# Patient Record
Sex: Male | Born: 1940 | Race: White | Hispanic: No | Smoking: Never smoker
Health system: Southern US, Community
[De-identification: ages and names within clinical notes are randomized; demographics above are authoritative.]

## PROBLEM LIST (undated history)

## (undated) DIAGNOSIS — G4762 Sleep related leg cramps: Secondary | ICD-10-CM

## (undated) DIAGNOSIS — J189 Pneumonia, unspecified organism: Secondary | ICD-10-CM

## (undated) DIAGNOSIS — Z86010 Personal history of colonic polyps: Secondary | ICD-10-CM

## (undated) DIAGNOSIS — G4733 Obstructive sleep apnea (adult) (pediatric): Secondary | ICD-10-CM

## (undated) DIAGNOSIS — R05 Cough: Secondary | ICD-10-CM

## (undated) DIAGNOSIS — R351 Nocturia: Secondary | ICD-10-CM

## (undated) DIAGNOSIS — C61 Malignant neoplasm of prostate: Secondary | ICD-10-CM

## (undated) DIAGNOSIS — K219 Gastro-esophageal reflux disease without esophagitis: Secondary | ICD-10-CM

## (undated) DIAGNOSIS — I10 Essential (primary) hypertension: Secondary | ICD-10-CM

## (undated) DIAGNOSIS — R053 Chronic cough: Secondary | ICD-10-CM

## (undated) DIAGNOSIS — R413 Other amnesia: Secondary | ICD-10-CM

## (undated) DIAGNOSIS — J342 Deviated nasal septum: Secondary | ICD-10-CM

## (undated) DIAGNOSIS — E785 Hyperlipidemia, unspecified: Secondary | ICD-10-CM

## (undated) DIAGNOSIS — R232 Flushing: Secondary | ICD-10-CM

## (undated) DIAGNOSIS — Z9289 Personal history of other medical treatment: Secondary | ICD-10-CM

## (undated) DIAGNOSIS — M1990 Unspecified osteoarthritis, unspecified site: Secondary | ICD-10-CM

## (undated) DIAGNOSIS — N401 Enlarged prostate with lower urinary tract symptoms: Secondary | ICD-10-CM

## (undated) DIAGNOSIS — Z860101 Personal history of adenomatous and serrated colon polyps: Secondary | ICD-10-CM

## (undated) DIAGNOSIS — Z8042 Family history of malignant neoplasm of prostate: Secondary | ICD-10-CM

## (undated) HISTORY — DX: Sleep related leg cramps: G47.62

## (undated) HISTORY — DX: Chronic cough: R05.3

## (undated) HISTORY — PX: KNEE ARTHROSCOPY: SUR90

## (undated) HISTORY — PX: PROSTATE BIOPSY: SHX241

## (undated) HISTORY — DX: Pneumonia, unspecified organism: J18.9

## (undated) HISTORY — DX: Cough: R05

## (undated) HISTORY — PX: TOTAL KNEE ARTHROPLASTY: SHX125

## (undated) HISTORY — DX: Family history of malignant neoplasm of prostate: Z80.42

## (undated) HISTORY — PX: COLONOSCOPY: SHX174

## (undated) HISTORY — DX: Essential (primary) hypertension: I10

---

## 1968-10-09 HISTORY — PX: TOE SURGERY: SHX1073

## 1990-10-09 HISTORY — PX: HAND SURGERY: SHX662

## 1998-11-25 ENCOUNTER — Emergency Department (HOSPITAL_COMMUNITY): Admission: EM | Admit: 1998-11-25 | Discharge: 1998-11-25 | Payer: Self-pay | Admitting: Emergency Medicine

## 2000-09-07 ENCOUNTER — Other Ambulatory Visit: Admission: RE | Admit: 2000-09-07 | Discharge: 2000-09-07 | Payer: Self-pay | Admitting: Gastroenterology

## 2002-10-09 HISTORY — PX: ROTATOR CUFF REPAIR: SHX139

## 2006-11-28 ENCOUNTER — Ambulatory Visit: Payer: Self-pay | Admitting: Gastroenterology

## 2006-11-30 ENCOUNTER — Ambulatory Visit: Payer: Self-pay | Admitting: Gastroenterology

## 2006-11-30 ENCOUNTER — Encounter (INDEPENDENT_AMBULATORY_CARE_PROVIDER_SITE_OTHER): Payer: Self-pay | Admitting: Specialist

## 2009-01-13 DIAGNOSIS — I10 Essential (primary) hypertension: Secondary | ICD-10-CM | POA: Insufficient documentation

## 2009-01-14 ENCOUNTER — Ambulatory Visit: Payer: Self-pay | Admitting: Cardiology

## 2009-01-14 ENCOUNTER — Encounter: Payer: Self-pay | Admitting: Cardiology

## 2009-01-14 ENCOUNTER — Ambulatory Visit: Payer: Self-pay

## 2009-01-25 ENCOUNTER — Inpatient Hospital Stay (HOSPITAL_COMMUNITY): Admission: RE | Admit: 2009-01-25 | Discharge: 2009-01-29 | Payer: Self-pay | Admitting: Orthopedic Surgery

## 2009-10-15 ENCOUNTER — Encounter (INDEPENDENT_AMBULATORY_CARE_PROVIDER_SITE_OTHER): Payer: Self-pay | Admitting: *Deleted

## 2010-01-25 ENCOUNTER — Encounter (INDEPENDENT_AMBULATORY_CARE_PROVIDER_SITE_OTHER): Payer: Self-pay | Admitting: *Deleted

## 2010-01-25 ENCOUNTER — Ambulatory Visit: Payer: Self-pay | Admitting: Gastroenterology

## 2010-01-25 DIAGNOSIS — Z96659 Presence of unspecified artificial knee joint: Secondary | ICD-10-CM | POA: Insufficient documentation

## 2010-01-25 DIAGNOSIS — Z860100 Personal history of colon polyps, unspecified: Secondary | ICD-10-CM | POA: Insufficient documentation

## 2010-03-30 ENCOUNTER — Ambulatory Visit: Payer: Self-pay | Admitting: Gastroenterology

## 2010-04-01 ENCOUNTER — Encounter: Payer: Self-pay | Admitting: Gastroenterology

## 2010-11-08 NOTE — Procedures (Signed)
Summary: Colonoscopy  Patient: Ricky [name] Note: All result statuses are Final unless otherwise noted.  Tests: (1) Colonoscopy (COL)   COL Colonoscopy           DONE     [facility]     [address]     [place], [place]  [address]           COLONOSCOPY PROCEDURE REPORT           PATIENT:  Ricky [name], Ricky [name]  MR#:  [id number]     BIRTHDATE:  1941/02/11, 68 yrs. old  GENDER:  male     ENDOSCOPIST:  Vania Rea. Jarold Motto, MD, [facility]     REF. BY:     PROCEDURE DATE:  03/30/2010     PROCEDURE:  Colonoscopy with snare polypectomy     ASA CLASS:  Class II     INDICATIONS:  history of pre-cancerous (adenomatous) colon polyps           MEDICATIONS:   Fentanyl 50 mcg IV, Versed 6 mg IV           DESCRIPTION OF PROCEDURE:   After the risks benefits and     alternatives of the procedure were thoroughly explained, informed     consent was obtained.  Digital rectal exam was performed and     revealed no abnormalities.   The LB [id number] endoscope     was introduced through the anus and advanced to the cecum, which     was identified by both the appendix and ileocecal valve, without     limitations.  The quality of the prep was good, using MoviPrep.     The instrument was then slowly withdrawn as the colon was fully     examined.     <<PROCEDUREIMAGES>>           FINDINGS:  There were multiple polyps identified and removed.     found scattered throught the colon. fLAT 5-15 MM POLYPS HOT AND     COLD SNARE EXCISED FROM #1 CECUM, ANDv #2 FROM TV COLON.  Mild     diverticulosis was found in the sigmoid to descending colon     segments.   Retroflexed views in the rectum revealed no     abnormalities.    The scope was then withdrawn from the patient     and the procedure completed.           COMPLICATIONS:  None     ENDOSCOPIC IMPRESSION:     1) Polyps, multiple found scattered throught the colon     2) Mild diverticulosis in the sigmoid to descending colon     segments  RECOMMENDATIONS:     3Y FOLLOWUP.     REPEAT EXAM:  No           ______________________________     Vania Rea. Jarold Motto, MD, [facility]           CC:  Rudi Heap, MD           n.     Rosalie Doctor:   Vania Rea. Narda Fundora at 03/30/2010 11:57 AM           Geoffery Spruce, [id number]  Note: An exclamation mark (!) indicates a result that was not dispersed into the flowsheet. Document Creation Date: 03/30/2010 11:57 AM _______________________________________________________________________  (1) Order result status: Final Collection or observation date-time: 03/30/2010 11:49 Requested date-time:  Receipt date-time:  Reported date-time:  Referring Physician:   Ordering Physician: Onalee Hua  Jarold Motto [phone]) Specimen Source:  Source: Launa Grill Order Number: [id number] Lab site:   Appended Document: Colonoscopy     Procedures Next Due Date:    Colonoscopy: 03/2013

## 2010-11-08 NOTE — Letter (Signed)
Summary: Colonoscopy-Changed to Office Visit Letter  [facility]  [address] [place], [place] [address]   Phone: [phone]  Fax: [phone]      October 15, 2009 MRN: [id number]   AVEL OGAWA [address] [place], [place]  [address]   Dear Mr. NALEPA,   According to our records, it is time for you to schedule a Colonoscopy. However, after reviewing your medical record, I feel that an office visit would be most appropriate to more completely evaluate you and determine your need for a repeat procedure.  Please call [phone] (option #2) at your convenience to schedule an office visit. If you have any questions, concerns, or feel that this letter is in error, we would appreciate your call.   Sincerely,  Vania Rea. Jarold Motto, M.D.  [facility] [phone]

## 2010-11-08 NOTE — Letter (Signed)
Summary: [facility] Instructions  [facility]  [address] [place], [place] [address]   Phone: [phone]  Fax: [phone]       Ricky [name]    [birth year]    MRN: [id number]        Procedure Day Dorna Bloom: Wednesday, 03/30/10     Arrival Time: 10:00      Procedure Time: 11:00     Location of Procedure:                    _X _  [facility] (4th Floor)                        PREPARATION FOR COLONOSCOPY WITH MOVIPREP   Starting 5 days prior to your procedure 03/25/10 do not eat nuts, seeds, popcorn, corn, beans, peas,  salads, or any raw vegetables.  Do not take any fiber supplements (e.g. Metamucil, Citrucel, and Benefiber).  THE DAY BEFORE YOUR PROCEDURE         DATE: 03/29/10   DAY: Tuesday  1.  Drink clear liquids the entire day-NO SOLID FOOD  2.  Do not drink anything colored red or purple.  Avoid juices with pulp.  No orange juice.  3.  Drink at least 64 oz. (8 glasses) of fluid/clear liquids during the day to prevent dehydration and help the prep work efficiently.  CLEAR LIQUIDS INCLUDE: Water Jello Ice Popsicles Tea (sugar ok, no milk/cream) Powdered fruit flavored drinks Coffee (sugar ok, no milk/cream) Gatorade Juice: apple, white grape, white cranberry  Lemonade Clear bullion, consomm, broth Carbonated beverages (any kind) Strained chicken noodle soup Hard Candy                             4.  In the morning, mix first dose of MoviPrep solution:    Empty 1 Pouch A and 1 Pouch B into the disposable container    Add lukewarm drinking water to the top line of the container. Mix to dissolve    Refrigerate (mixed solution should be used within 24 hrs)  5.  Begin drinking the prep at 5:00 p.m. The MoviPrep container is divided by 4 marks.   Every 15 minutes drink the solution down to the next mark (approximately 8 oz) until the full liter is complete.   6.  Follow completed prep with 16 oz of clear liquid of your choice (Nothing  red or purple).  Continue to drink clear liquids until bedtime.  7.  Before going to bed, mix second dose of MoviPrep solution:    Empty 1 Pouch A and 1 Pouch B into the disposable container    Add lukewarm drinking water to the top line of the container. Mix to dissolve    Refrigerate  THE DAY OF YOUR PROCEDURE      DATE: 03/30/10   DAY:  Wednesday  Beginning at  6:00 a.m. (5 hours before procedure):         1. Every 15 minutes, drink the solution down to the next mark (approx 8 oz) until the full liter is complete.  2. Follow completed prep with 16 oz. of clear liquid of your choice.    3. You may drink clear liquids until 9:00  (2 HOURS BEFORE PROCEDURE).   MEDICATION INSTRUCTIONS  Unless otherwise instructed, you should take regular prescription medications with a small sip of water   as early as  possible the morning of your procedure.                 OTHER INSTRUCTIONS  You will need a responsible adult at least 70 years of age to accompany you and drive you home.   This person must remain in the waiting room during your procedure.  Wear loose fitting clothing that is easily removed.  Leave jewelry and other valuables at home.  However, you may wish to bring a book to read or  an iPod/MP3 player to listen to music as you wait for your procedure to start.  Remove all body piercing jewelry and leave at home.  Total time from sign-in until discharge is approximately 2-3 hours.  You should go home directly after your procedure and rest.  You can resume normal activities the  day after your procedure.  The day of your procedure you should not:   Drive   Make legal decisions   Operate machinery   Drink alcohol   Return to work  You will receive specific instructions about eating, activities and medications before you leave.    The above instructions have been reviewed and explained to me by   _______________________    I fully understand and can  verbalize these instructions _____________________________ Date _________

## 2010-11-08 NOTE — Letter (Signed)
Summary: Patient Notice- Polyp Results  [facility]  [address] [place], [place] [address]   Phone: [phone]  Fax: [phone]        April 01, 2010 MRN: [id number]    Ricky [name] [address] [place], [place]  [address]    Dear Ricky [name],  I am pleased to inform you that the colon polyp(s) removed during your recent colonoscopy was (were) found to be benign (no cancer detected) upon pathologic examination.  I recommend you have a repeat colonoscopy examination in 3_ years to look for recurrent polyps, as having colon polyps increases your risk for having recurrent polyps or even colon cancer in the future.  Should you develop new or worsening symptoms of abdominal pain, bowel habit changes or bleeding from the rectum or bowels, please schedule an evaluation with either your primary care physician or with me.  Additional information/recommendations:  __ No further action with gastroenterology is needed at this time. Please      follow-up with your primary care physician for your other healthcare      needs.  __ Please call [phone] to schedule a return visit to review your      situation.  __ Please keep your follow-up visit as already scheduled.  _X_ Continue treatment plan as outlined the day of your exam.  Please call us if you are having persistent problems or have questions about your condition that have not been fully answered at this time.  Sincerely,  Mardella Layman MD [facility]  This letter has been electronically signed by your physician.  Appended Document: Patient Notice- Polyp Results letter mailed.

## 2010-11-08 NOTE — Assessment & Plan Note (Signed)
Summary: colon recall cx needs ov...em   History of Present Illness Visit Type: Follow-up Visit Primary GI MD: Sheryn Bison MD FACP FAGA Primary Provider: Rudi Heap, MD Chief Complaint: Colon recall, no problems History of Present Illness:   70 year old Caucasian male here for followup colonoscopy from colonoscopy 3 years ago which showed multiple adenomatous polyps. He is currently asymptomatic and denies upper gastrointestinal or lower gastrointestinal or hepatobiliary complaints. He does have mild essential hypertension has had bilateral knee replacements with surgery by Dr. Despina Hick. He denies chronic NSAID use. Family history is noncontributory. His appetite is good and his weight is stable. Before his recent orthopedic surgery had cardiac stress testing that was normal. He is followed by Dr. Rudi Heap in [facility]   GI Review of Systems      Denies abdominal pain, acid reflux, belching, bloating, chest pain, dysphagia with liquids, dysphagia with solids, heartburn, loss of appetite, nausea, vomiting, vomiting blood, weight loss, and  weight gain.        Denies anal fissure, black tarry stools, change in bowel habit, constipation, diarrhea, diverticulosis, fecal incontinence, heme positive stool, hemorrhoids, irritable bowel syndrome, jaundice, light color stool, liver problems, rectal bleeding, and  rectal pain.    Current Medications (verified): 1)  Tamsulosin Hcl 0.4 Mg Caps (Tamsulosin Hcl) .... Two Times A Day 2)  Klor-Con M20 20 Meq Cr-Tabs (Potassium Chloride Crys Cr) .... Two Times A Day 3)  Valturna 150-160 Mg Tabs (Aliskiren-Valsartan) .... Once Daily 4)  Amlodipine Besylate 10 Mg Tabs (Amlodipine Besylate) .... Once Daily 5)  Terbinafine Hcl 250 Mg Tabs (Terbinafine Hcl) .... Once Daily 6)  Oxybutynin Chloride 5 Mg Xr24h-Tab (Oxybutynin Chloride) .... Once Daily  Allergies (verified): No Known Drug Allergies  Past History:  Past medical, surgical,  family and social histories (including risk factors) reviewed for relevance to current acute and chronic problems.  Past Medical History: Reviewed history from 01/13/2009 and no changes required. HYPERTENSION, UNSPECIFIED (ICD-401.9)  Past Surgical History: Reviewed history from 01/13/2009 and no changes required. Vasectomy Microwave therapy for BPH  Family History: Reviewed history from 01/13/2009 and no changes required. Noncontributory No FH of Colon Cancer: Family History of Prostate Cancer:Father  Social History: Reviewed history from 01/13/2009 and no changes required. Retired  Single  Tobacco Use - No.  Alcohol Use - no  Review of Systems       The patient complains of allergy/sinus, arthritis/joint pain, swelling of feet/legs, and urine leakage.  The patient denies anemia, anxiety-new, back pain, blood in urine, breast changes/lumps, change in vision, confusion, cough, coughing up blood, depression-new, fainting, fatigue, fever, headaches-new, hearing problems, heart murmur, heart rhythm changes, itching, menstrual pain, muscle pains/cramps, night sweats, nosebleeds, pregnancy symptoms, shortness of breath, skin rash, sleeping problems, sore throat, swollen lymph glands, thirst - excessive , urination - excessive , urination changes/pain, vision changes, and voice change.    Vital Signs:  Patient profile:   70 year old male Height:      67 inches Weight:      201.50 pounds BMI:     31.67 Pulse rate:   68 / minute Pulse rhythm:   regular BP sitting:   142 / 86  (left arm) Cuff size:   regular  Vitals Entered By: June McMurray CMA Duncan Dull) (January 25, 2010 10:36 AM)  Physical Exam  General:  Well developed, well nourished, no acute distress.healthy appearing.   Head:  Normocephalic and atraumatic. Eyes:  PERRLA, no icterus.exam deferred to patient's ophthalmologist.  Lungs:  Clear throughout to auscultation. Heart:  Regular rate and rhythm; no murmurs, rubs,  or  bruits. Abdomen:  Soft, nontender and nondistended. No masses, hepatosplenomegaly or hernias noted. Normal bowel sounds. Extremities:  No clubbing, cyanosis, edema or deformities noted. Neurologic:  Alert and  oriented x4;  grossly normal neurologically. Psych:  Alert and cooperative. Normal mood and affect.   Impression & Recommendations:  Problem # 1:  HYPERTENSION, UNSPECIFIED (ICD-401.9) Assessment Improved blood pressure today is 142/86 and I have advised him to continue all of his meds per Dr. Christell Constant  Problem # 2:  PERSONAL HX COLONIC POLYPS (ICD-V12.72) Assessment: Unchanged colonoscopy with standard prep scheduled at [place] convenience. [place]. I did not repeat any lab data at this time.  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 2)  Please take the prescription to your pharmacy for your prep. 3)  The medication list was reviewed and reconciled.  All changed / newly prescribed medications were explained.  A complete medication list was provided to the patient / caregiver. 4)  Copy sent to : Dr. Rudi Heap and Dr. Trudee Grip in orthopedics. 5)  Please continue current medications.  6)  Colonoscopy and Flexible Sigmoidoscopy brochure given.  7)  Conscious Sedation brochure given.  Prescriptions: MOVIPREP 100 GM  SOLR (PEG-KCL-NACL-NASULF-NA ASC-C) As per prep instructions.  #1 x 0   Entered by:   Ashok Cordia RN   Authorized by:   Mardella Layman MD [facility]   Signed by:   Ashok Cordia RN on 01/25/2010   Method used:   Print then Give to Patient   RxID:   [id number]   Appended Document: colon recall cx needs ov...em    Clinical Lists Changes  Orders: Added new Test order of Colonoscopy (Colon) - Signed

## 2010-11-08 NOTE — Procedures (Signed)
Summary: Colon and Path   Colonoscopy  Procedure date:  11/30/2006  Findings:      Location:  [facility].    Colonoscopy  Procedure date:  11/30/2006  Findings:      Location:  [facility].   Patient Name: Ricky [name], Ricky [name]. MRN:  Procedure Procedures: Colonoscopy CPT: [phone].  Personnel: Endoscopist: Vania Rea. Jarold Motto, MD.  Exam Location: Exam performed in Outpatient Clinic. Outpatient  Patient Consent: Procedure, Alternatives, Risks and Benefits discussed, consent obtained, from patient. Consent was obtained by the RN.  Indications Symptoms: Change in bowel habits. Rectal incontinency...  History  Current Medications: Patient is not currently taking Coumadin.  Medical/ Surgical History: Hypertension, Benign Prostatic Hypertrophy,  Pre-Exam Physical: Performed Nov 30, 2006. Cardio-pulmonary exam, Rectal exam, Abdominal exam, Extremity exam, Mental status exam WNL.  Comments: Pt. history reviewed/updated, physical exam performed prior to initiation of sedation?yes Exam Exam: Extent of exam reached: Cecum, extent intended: Cecum.  The cecum was identified by appendiceal orifice and IC valve. Patient position: on left side. Colon retroflexion performed. Images taken. ASA Classification: II. Tolerance: excellent.  Monitoring: Pulse and BP monitoring, Oximetry used. Supplemental O2 given. at 2 Liters.  Colon Prep Used Golytely for colon prep. Prep results: good.  Sedation Meds: Patient assessed and found to be appropriate for moderate (conscious) sedation. Fentanyl 50 mcg. given IV. Versed 8 mg. given IV.  Instrument(s): CF 140L. Serial [id number].  Findings - MULTIPLE POLYPS: Transverse Colon. minimum size 2 mm, maximum size 5 mm. Procedure:  snare with cautery, removed, Polyp retrieved, Polyps sent to pathology. Comments: Specium #1.  - NORMAL EXAM: Cecum to Rectum. Not Seen: Polyps. AVM's. Colitis. Tumors. Melanosis. Crohn's.  Diverticulosis. Hemorrhoids.  - POLYP: Sigmoid Colon, Maximum size: 15 mm. pedunculated polyp. Procedure:  snare with cautery, removed, retrieved, Polyp sent to pathology. ICD9: Colon Polyps: 211.3.   Assessment  Diagnoses: 211.3: Colon Polyps.   Events  Unplanned Interventions: No intervention was required.  Plans  Post Exam Instructions: No aspirin or non-steroidal containing medications: 2 weeks.  Medication Plan: Await pathology. Referring provider to order medications.  Patient Education: Patient given standard instructions for: Polyps. Patient instructed to get routine colonoscopy every 3 years.  Comments: His BP is significently elevated and I started dioban 80mg /day with F/U Dr. Christell Constant.... Disposition: After procedure patient sent to recovery. After recovery patient sent home.  Scheduling/Referral: Follow-Up prn. Await pathology to schedule patient.    cc: Rudi Heap, MD      [facility] Surgical Pathology - STATUS: Final             By: Windy Kalata,      Perform Date: 22Feb08 00:01  Ordered By: Talbert Forest Date: 25Feb08 08:44  Facility: [facility]                               Department: CPATH  Service Report Text  [facility]   [address] [address]   [place], [place] [address]   Telephone [phone] or [phone] Fax [phone]    REPORT OF SURGICAL PATHOLOGY    Case #: [id number]   Patient Name: [name], [name]   Office Chart Number: [id number]    MRN: [id number]   Pathologist: Alden Server A. Delila Spence, MD   DOB/Age [birth year] (Age: 70) Gender: M   Date Taken: 11/30/2006   Date Received:  11/30/2006    FINAL DIAGNOSIS    ***MICROSCOPIC EXAMINATION AND DIAGNOSIS***    1. TRANSVERSE COLON, POLYP(S): ADENOMATOUS POLYP(S). NO HIGH   GRADE DYSPLASIA OR INVASIVE MALIGNANCY IDENTIFIED.    2. SIGMOID COLON, POLYP(S): ADENOMATOUS POLYP(S). NO HIGH   GRADE DYSPLASIA OR INVASIVE MALIGNANCY IDENTIFIED.    mj    Date Reported: 12/03/2006 Alden Server A. Delila Spence, MD   *** Electronically Signed Out By [facility] ***    Clinical information   1,2. R/O adenoma (jes)    specimen(s) obtained   1: Colon, polyp(s), transverse   2: Colon, polyp(s), sigmoid    Gross Description   1. Received in formalin is a tan, soft tissue fragment that is   submitted in toto. Size: 0.4 cm    2. Received in formalin is a 1.2 cm tan-red mucosal polyp and   two separate tan mucosal fragments measuring 0.3 and 0.5 cm in   greatest dimension. The polyp is inked and sectioned. One   smaller piece is inked and bisected. The specimen is entirely   submitted in two cassettes.   A one small fragment and one bisected fragment   B one sectioned polyp (GP:jes, 12/03/06)    jes/    This report was created from the original endoscopy report, which was reviewed and signed by the above listed endoscopist.

## 2011-01-18 LAB — DIFFERENTIAL
Basophils Absolute: 0 10*3/uL (ref 0.0–0.1)
Eosinophils Relative: 1 % (ref 0–5)
Lymphocytes Relative: 11 % — ABNORMAL LOW (ref 12–46)
Monocytes Absolute: 0.8 10*3/uL (ref 0.1–1.0)

## 2011-01-18 LAB — BASIC METABOLIC PANEL
BUN: 17 mg/dL (ref 6–23)
BUN: 20 mg/dL (ref 6–23)
BUN: 22 mg/dL (ref 6–23)
CO2: 30 mEq/L (ref 19–32)
CO2: 31 mEq/L (ref 19–32)
Calcium: 7.8 mg/dL — ABNORMAL LOW (ref 8.4–10.5)
Calcium: 8.1 mg/dL — ABNORMAL LOW (ref 8.4–10.5)
Calcium: 8.1 mg/dL — ABNORMAL LOW (ref 8.4–10.5)
Chloride: 102 mEq/L (ref 96–112)
Chloride: 105 mEq/L (ref 96–112)
Creatinine, Ser: 1.21 mg/dL (ref 0.4–1.5)
Creatinine, Ser: 1.28 mg/dL (ref 0.4–1.5)
GFR calc Af Amer: >60 mL/min (ref >60)
GFR calc Af Amer: >60 mL/min (ref >60)
GFR calc non Af Amer: 44 mL/min — ABNORMAL LOW (ref >60)
GFR calc non Af Amer: 56 mL/min — ABNORMAL LOW (ref >60)
GFR calc non Af Amer: >60 mL/min (ref >60)
Glucose, Bld: 113 mg/dL — ABNORMAL HIGH (ref 70–99)
Glucose, Bld: 141 mg/dL — ABNORMAL HIGH (ref 70–99)
Potassium: 3 mEq/L — ABNORMAL LOW (ref 3.5–5.1)
Potassium: 3.6 mEq/L (ref 3.5–5.1)
Sodium: 140 mEq/L (ref 135–145)

## 2011-01-18 LAB — COMPREHENSIVE METABOLIC PANEL
BUN: 13 mg/dL (ref 6–23)
CO2: 35 mEq/L — ABNORMAL HIGH (ref 19–32)
Calcium: 9.2 mg/dL (ref 8.4–10.5)
Creatinine, Ser: 1.12 mg/dL (ref 0.4–1.5)
GFR calc non Af Amer: >60 mL/min (ref >60)
Glucose, Bld: 87 mg/dL (ref 70–99)
Total Bilirubin: 0.9 mg/dL (ref 0.3–1.2)

## 2011-01-18 LAB — CBC
HCT: 28.5 % — ABNORMAL LOW (ref 39.0–52.0)
HCT: 40 % (ref 39.0–52.0)
Hemoglobin: 9.8 g/dL — ABNORMAL LOW (ref 13.0–17.0)
Hemoglobin: 13.6 g/dL (ref 13.0–17.0)
MCHC: 34.3 g/dL (ref 30.0–36.0)
MCHC: 34.3 g/dL (ref 30.0–36.0)
MCHC: 34.6 g/dL (ref 30.0–36.0)
MCHC: 34.1 g/dL (ref 30.0–36.0)
MCV: 84.6 fL (ref 78.0–100.0)
MCV: 84.8 fL (ref 78.0–100.0)
Platelets: 171 10*3/uL (ref 150–400)
RBC: 2.88 MIL/uL — ABNORMAL LOW (ref 4.22–5.81)
RBC: 3.39 MIL/uL — ABNORMAL LOW (ref 4.22–5.81)
RBC: 3.44 MIL/uL — ABNORMAL LOW (ref 4.22–5.81)
RBC: 4.72 MIL/uL (ref 4.22–5.81)
RDW: 14.3 % (ref 11.5–15.5)
WBC: 7.7 10*3/uL (ref 4.0–10.5)
WBC: 7.7 10*3/uL (ref 4.0–10.5)

## 2011-01-18 LAB — URINALYSIS, ROUTINE W REFLEX MICROSCOPIC
Bilirubin Urine: NEGATIVE
Glucose, UA: NEGATIVE mg/dL
Hgb urine dipstick: NEGATIVE
Specific Gravity, Urine: 1.026 (ref 1.005–1.030)
Urobilinogen, UA: 1 mg/dL (ref 0.0–1.0)
pH: 6 (ref 5.0–8.0)

## 2011-01-18 LAB — PROTIME-INR
INR: 1.3 (ref 0.00–1.49)
Prothrombin Time: 16.1 seconds — ABNORMAL HIGH (ref 11.6–15.2)
Prothrombin Time: 16.8 seconds — ABNORMAL HIGH (ref 11.6–15.2)
Prothrombin Time: 14.8 seconds (ref 11.6–15.2)

## 2011-01-18 LAB — APTT: aPTT: 33 seconds (ref 24–37)

## 2011-01-18 LAB — ABO/RH: ABO/RH(D): O POS

## 2011-01-18 LAB — TYPE AND SCREEN: Antibody Screen: NEGATIVE

## 2011-02-21 NOTE — H&P (Signed)
Ricky [name], Ricky [name]           ACCOUNT NO.:  [id number]   MEDICAL RECORD NO.:  [id number]          PATIENT TYPE:  INP   LOCATION:                               FACILITY:  [facility]   PHYSICIAN:  Ollen Gross, M.D.    DATE OF BIRTH:  [birth year]   DATE OF ADMISSION:  01/25/2009  DATE OF DISCHARGE:                              HISTORY & PHYSICAL   DATE OF OFFICE VISIT/HISTORY AND PHYSICAL:  Done on January 19, 2009.   DATE OF ADMISSION:  January 25, 2009.   CHIEF COMPLAINT:  Bilateral knee pain.   HISTORY OF PRESENT ILLNESS:  The patient is a 70 year old male who has  been seen by Dr. Lequita Halt and followed for quite some time now for  bilateral knee pain.  He has known arthritic changes in both knees with  progressive symptoms.  Over time they have continued to get worse.  He  has been found to have significant arthritic changes in both knees.  At  this point he would like to have something permanently done.  Risks and  benefits have been discussed.  He elects to proceed with surgery.  He  has been seen by Dr. Christell Constant and felt to be stable for surgery.  He has  also had a recent stress test by Dr. Antoine Poche and felt there was no  evidence of high-grade obstructive coronary arterial disease.  No  further testing is indicated.   ALLERGIES:  None.   CURRENT MEDICATIONS:  Aleve, amlodipine, Flomax, Valturna fiber tablets.   PAST MEDICAL HISTORY:  Hypertension, enlarged prostate.   PAST SURGICAL HISTORY:  Bilateral knee arthroscopies.  He has had a  prostate micro waving procedure.   FAMILY HISTORY:  Father with a history of prostate cancer.   SOCIAL HISTORY:  Married, retired, nonsmoker.  One drink per month.  Three children.  Wife will be assisting with care after surgery.   REVIEW OF SYSTEMS:  GENERAL:  No fevers, chills, night sweats.  NEURO:  Little bit of tinnitus/ringing.  No seizures, syncope, paralysis.  RESPIRATORY:  No shortness of breath, productive cough or hemoptysis.  CARDIOVASCULAR:  No chest pain, angina, orthopnea.  GI:  No nausea,  vomiting, diarrhea or constipation.  GU:  No dysuria, hematuria or  discharge.  MUSCULOSKELETAL:  Bilateral knees.   PHYSICAL EXAMINATION:  VITAL SIGNS:  Pulse 60, respirations 12, blood  pressure 148/82.  GENERAL:  70 year old white male, well-nourished, well-developed, no  acute distress.  He is alert, oriented and cooperative.  Good historian.  HEENT:  Normocephalic, atraumatic.  Pupils are round and reactive.  EOMs  intact.  NECK:  Supple.  CHEST:  Clear.  HEART:  Regular rate and rhythm.  No murmur.  ABDOMEN:  Soft, nontender.  Bowel sounds present.  RECTAL/BREASTS/GENITALIA:  Not done.  Not pertinent to present illness.  EXTREMITIES:  Left knee shows range of motion 5-120, marked crepitus, no  instability.  Right knee shows no effusion.  Range of motion 5-125,  slight valgus, marked crepitus.   IMPRESSION:  Osteoarthritis bilateral knees.   PLAN:  The patient will be admitted to [facility] to undergo  bilateral total knee replacement arthroplasties.  Surgery will be  performed by Ollen Gross, M.D.      Alexzandrew L. Perkins, P.A.C.      Ollen Gross, M.D.  Electronically Signed    ALP/MEDQ  D:  01/24/2009  T:  01/24/2009  Job:  [id number]   cc:   [name], M.D.  Fax: [phone]   Rollene Rotunda, MD, [facility]  [address]  [address]  [place]  [place] [address]   Ollen Gross, M.D.  Fax: [phone]

## 2011-02-21 NOTE — Op Note (Signed)
Ricky [name], Ricky [name]                ACCOUNT NO.:  [id number]   MEDICAL RECORD NO.:  [id number]          PATIENT TYPE:  INP   LOCATION:  0007                         FACILITY:  [facility]   PHYSICIAN:  Ollen Gross, M.D.    DATE OF BIRTH:  08/05/1941   DATE OF PROCEDURE:  01/25/2009  DATE OF DISCHARGE:                               OPERATIVE REPORT   PREOPERATIVE DIAGNOSIS:  Osteoarthritis, bilateral knees.   POSTOPERATIVE DIAGNOSIS:  Osteoarthritis, bilateral knees.   PROCEDURE:  Bilateral total knee arthroplasty.   SURGEON:  Ollen Gross, M.D.   ASSISTANT:  Alexzandrew L. Perkins, P.A.C.   ANESTHESIA:  General with epidural.   ESTIMATED BLOOD LOSS:  Minimal.   DRAIN:  Autovac times one each side.   TOURNIQUET TIME:  Left 46 minutes at 300 mmHg, right 44 minutes at 300  mmHg.   COMPLICATIONS:  None.   CONDITION:  Stable to recovery room.   BRIEF CLINICAL NOTE:  Ricky [name] is 70 year old male with advanced  arthritic changes to both knees, left more symptomatic than the right.  He has failed nonoperative management and presents now for total knee  arthroplasty.   PROCEDURE IN DETAIL:  After successful administration of epidural  catheter and general anesthetic a tourniquet was placed high on both  thighs and both lower extremities were prepped and draped in the usual  sterile fashion.  His left side was more symptomatic so we addressed  that first.  The left lower extremity was wrapped in Esmarch, knee  flexed and tourniquet inflated to 300 mmHg.  A midline incision was made  with a 10 blade through subcutaneous tissue to the level of the extensor  mechanism.  A fresh blade was used to make a medial parapatellar  arthrotomy.  The soft tissue on the proximal medial tibia was  subperiosteally elevated to the joint line with the knife and into the  semimembranosus bursa with a Cobb elevator.  The soft tissue laterally  was elevated with attention being paid to avoid the  patellar tendon on  the tibial tubercle.  The patella was subluxed laterally.  The knee  flexed 90 degrees and the ACL and PCL were removed.  The drill was used  to create a starting hole in the distal femur and the canal was  thoroughly irrigated.  The 5 degrees left valgus alignment guide was  placed and referencing off the posterior condyles, rotation was marked  and the block pinned to remove 10 mm off the distal femur.  The distal  femoral resection was made with an oscillating saw.  Th sizing block was  placed and size 4 was most appropriate.  Rotation was marked at the  epicondylar axis.  The size 4 cutting block was placed and the anterior,  posterior and chamfer cuts were made.   The tibia was subluxed forward and the menisci removed.  The  extramedullary tibial alignment guide was placed referencing proximally  at the medial aspect of the tibial tubercle and distally along the  second metatarsal axis and tibial crest.  A block was pinned to remove  10 mm of the non-deficient lateral side.  The tibial resection was made  with an oscillating saw.  The size 4 was the most appropriate tibial  component and the proximal tibia was prepared the modular drill and keel  punch for the size 4.  Femoral preparation was completed with the  intercondylar cut.   The size 4 mobile bearing tibial trial, size 4 posterior stabilized  femoral trial and a 10 mm posterior stabilized rotating platform insert  trial were placed.  With the 10 there was some AP play, so I went to  12.5 which allowed for full extension with excellent varus, valgus,  anterior and posterior balance throughout full range of motion.  Of  note, there is a tiny bit of overhang with a 4 femur so I decided to use  a 4 narrow which had a better fit on the cut bony surface with no  overhang.  The patella was then everted and thickness measured to be 22  mm.  The freehand resection was taken to 12 mm, 35 template was placed,  lug  holes were drilled, trial patella was placed and it tracked  normally.  Osteophytes were removed off the posterior femur with the  trial in place.  All trials were removed and the cut bone surfaces were  prepared with pulsatile lavage.  The cement was mixed and once ready for  implantation a size 4 mobile bearing tibial tray, size 4 narrow  posterior stabilized femur and the 35 patella are cemented into place.  The patella was held with a clamp.  Trial 12.5 mm insert was placed and  the knee held in full extension and all extruded cement removed.  When  the cement was fully hardened then the permanent 12.5 mm posterior  stabilized rotating platform insert was placed into the tibial tray.  The wound was copiously irrigated with saline solution and then the  arthrotomy closed over an Autovac drain with interrupted #1 PDS.  Flexion against gravity was about 135-140 degrees.  The tourniquet was  then released for a total time of 46 minutes.  The subcutaneous was  closed with interrupted 2-0 Vicryl and subcuticular running 4-0  Monocryl.   The right lower extremity was then addressed.  The right lower extremity  was wrapped in Esmarch.  The knee flexed and the tourniquet inflated to  300 mmHg.  The same midline incision was made with a 10 blade through  subcutaneous tissue to the extensor mechanism.  A medial arthrotomy was  made.  The soft tissue releases were performed.  The patella subluxed  laterally.  The knee was flexed 90 degrees.  The ACL and PCL were  removed.  A drill was used to create a starting hole in the distal femur  and the canal was thoroughly irrigated.  The 5 degrees right valgus  alignment guide was placed and referencing off the posterior condyles  rotation was marked and the block pinned to remove 10 mm off the distal  femur.  The distal femoral resection was made with an oscillating saw.  The sizing block was placed and the size 4 was most appropriate.  Rotation was  marked at the epicondylar axis.  A size 4 cutting block was  placed.  The anterior, posterior and chamfer cuts were made.   The tibia was subluxed forward and the menisci removed.  The  extramedullary tibial alignment guide was placed referencing proximally  at the medial aspect of the tibial tubercle and distally  along the  second metatarsal axis and tibial crest.  The block was pinned to remove  about 2 mm of the more deficient lateral side.  The tibial resection was  made with an oscillating saw.  A size 4 was most appropriate tibial  component and the proximal tibia was prepared with the modular drill and  keel punch for the size 4.  Femoral preparation was completed with the  intercondylar cut.   The size 4 mobile bearing tibial trial, the size 4 narrow posterior  stabilized femoral trial and 12.5 mm posterior stabilized rotating  platform insert trial were placed.  With the 12.5 full extension was  achieved with excellent varus, valgus, anterior and posterior balance  throughout full range of motion.  The osteophytes were removed off the  posterior femur with the trial in place.  All trials were removed and  the cut bone surfaces were irrigated with pulsatile lavage.  The cement  was mixed and once ready for implantation the size 4 mobile bearing  tibial tray, the size 4 narrow posterior stabilized femur and 38 patella  are cemented into place and the patella was held with a clamp.  The  trial 12.5 mm insert was placed.  The knee was held in full extension  and all extruded cement removed.  When the cement was fully hardened  then the permanent 12.5 mm posterior stabilized rotating platform insert  was placed into the tibial tray.  The wound was then copiously irrigated  with saline solution and the arthrotomy was closed with interrupted #1  PDS.  Flexion against gravity was 135 degrees.  The tourniquet was  released for total  time of 44 minutes.  The drains were hooked to  Autovac suction.  The  subcutaneous and subcuticular were closed and then Steri-Strips and  bulky sterile dressing were placed on both knees.  He was placed into  knee immobilizers, awakened and transported to recovery in stable  condition.      Ollen Gross, M.D.  Electronically Signed     FA/MEDQ  D:  01/25/2009  T:  01/25/2009  Job:  [id number]

## 2011-02-24 NOTE — Assessment & Plan Note (Signed)
[facility]                         GASTROENTEROLOGY OFFICE NOTE   NAME:Reitter, Altamease Oiler                        MRN:          [id number]  DATE:11/28/2006                            DOB:          10/15/1940    Ricky [name] is a 70 year old white male retiree referred by Dr. Christell Constant for  evaluation of rectal incontinency.   Ricky [name] over the last several months has had changes of bowel habits  with some rectal incontinence, all of which seemed to be helped by  taking Metamucil.  He denies abdominal pain or rectal bleeding or any  other gastrointestinal complaints.  His appetite is good and his weight  is stable and he has no reflux symptoms, dysphagia, or hepatobiliary  problems.  I previously saw him because of some diarrhea in 2002 and  he had a normal colonoscopy at that time including biopsy of his  terminal ileum and random colon biopsies.  He relates that that problem  resolved spontaneously.   PAST MEDICAL HISTORY:  Otherwise noncontributory except for mild  essential hypertension.  He has had a previous vasectomy and also has  had microwave therapy for benign prostatic hypertrophy.   MEDICATIONS:  1. Potassium chloride 10 mEq a day.  2. Benicar HCT 40/25 mg a day.  3. Daily Metamucil.  4. He uses p.r.n. Advil.   He denies allergies.   FAMILY HISTORY:  Noncontributory.   SOCIAL HISTORY:  The patient is single and is retired and lives alone.  He has a Naval architect.  He does not smoke or abuse ethanol.   REVIEW OF SYSTEMS:  Otherwise noncontributory.   EXAMINATION:  He is 5 feet 7 inches tall and weighs 203 pounds.  Blood  pressure in our office was 160/100 and pulse was 65 and regular.  I  could not appreciate stigmata of chronic liver disease.  His chest was  clear without wheezes or rhonchi and I could not appreciate murmurs,  gallops or rubs.  He was in a regular rhythm.  There was no  hepatosplenomegaly, abdominal masses or  tenderness.  Bowel sounds were  normal.  Peripheral extremities were unremarkable.  Mental status was  clear.  Inspection of rectum was normal, as was rectal exam.  He  appeared to have good rectal tone.  There were no rectal masses or  tenderness and stool was guaiac negative.   ASSESSMENT:  Ricky [name] has had changes of his bowel pattern which seems  to have alleviated by taking more fiber in his diet.  Because of his age  and complaints I do think we probably need followup colonoscopy exam at  this time.   RECOMMENDATIONS:  1. Continue high-fiber diet and Metamucil.  2. Screening colonoscopy at his convenience.  3. Continue other medications as per Dr. Christell Constant.     Vania Rea. Jarold Motto, MD, Caleen Essex, FAGA  Electronically Signed    DRP/MedQ  DD: 11/28/2006  DT: 11/28/2006  Job #: [id number]   cc:   [name], M.D.

## 2011-02-24 NOTE — Discharge Summary (Signed)
NAMEWENDALL, Ricky [name]                ACCOUNT NO.:  [id number]   MEDICAL RECORD NO.:  [id number]          PATIENT TYPE:  INP   LOCATION:  1612                         FACILITY:  [facility]   PHYSICIAN:  Ollen Gross, M.D.    DATE OF BIRTH:  August 20, 1941   DATE OF ADMISSION:  01/25/2009  DATE OF DISCHARGE:  01/29/2009                               DISCHARGE SUMMARY   ADMITTING DIAGNOSES:  1. Osteoarthritis bilateral knees.  2. Hypertension.  3. Benign prostatic hypertrophy/enlarged prostate.   DISCHARGE DIAGNOSES:  1. Osteoarthritis bilateral knee status post bilateral total knee      replacement arthroplasty.  2. Postoperative acute blood loss anemia.  3. Status post transfusion without sequelae.  4. Postoperative hypokalemia, improved.  5. Hypertension.  6. Benign prostatic hypertrophy/enlarged prostate.   PROCEDURE:  January 25, 2009 bilateral total knee replacement  arthroplasty.  Surgeon Dr. Lequita Halt, assistant Ricky Peace PA-C.  Anesthesia was general.  Postop epidural.  Tourniquet time left 46,  right 44.   CONSULTS:  Anesthesia department for control of epidural.   BRIEF HISTORY:  Ricky [name] is a 70 year old male with end-stage arthritis  both knees left more symptomatic than right, but both progressive in  nature, failed nonoperative management and now presents for total knee  arthroplasties.   LABORATORY DATA:  Preop CBC showed hemoglobin of 13.6, hematocrit of  40.0, white cell count of 4.8, platelets 220, postop hemoglobin of 10  drifted down to 8.4.  Given 2 units of blood, back up to 9.8 where it  stabilized.  Last known H and H 9.8 and 28.6.  PT/PTT 14.8 and 33,  respectively.  INR 1.1.  Serial protimes followed per Coumadin protocol.  Last known PT/INR 16.1 and 1.3.  Chem panel on admission:  Slightly low  potassium of 3.3 on admission, CO2 slightly elevated at 35.  Remaining  chem panel within normal limits.  Serial BMETs were followed.  Potassium  did drop down to 2.6,  came back up to 2.9.  Last noted at 3.6.  Preop UA  cloudy.  Trace ketones, otherwise, negative.  Blood group type O+.   EKG January 14, 2009:  Normal sinus rhythm and normal EKG.   HOSPITAL COURSE:  The patient admitted to [facility].  Taken  to the OR.  Underwent above-stated procedure without complication.  The  patient tolerated the procedure well. Later transferred to the recovery  floor, orthopedic floor.  Started on PCA and p.o. analgesics.  Placed on  epidural postop.  We did not restart his Coumadin right away. We waited  until the evening of day #1.  He had good output.  Hemovac drain of the  left knee came out early morning hours, and we removed the Hemovac drain  from the right knee without difficulty.  Had low potassium on day #1, so  we added potassium supplements.  He was seen by anesthesia, and they  left the epidural in until postop day #2.  Unfortunately the INR had  shot up to 1.7.  We gave him a little bit of vitamin K for it to  come  back down.  We changed both dressings on day #2, and both incisions were  healing well.  The INR came back down, and they were able to pull the  epidural on the late evening of postop day #2.  Once the epidural was  out, we started him on Lovenox.  The following morning was back on his  Coumadin.  His potassium had improved.  He had already started getting  up on day #2, and by day #3 he was starting to take a few steps, walking  about 20 feet, and then actually got up and walked about 180 feet the  next day.  He had a little bit increase in soreness after the epidural  came out, but he was tolerating his pills very well.  By day #4 he was  doing better.  Both incisions were healing well.  His INR was not quite  therapeutic, so we added a little Lovenox.  The patient was meeting his  goals and wanted to go home.   DISCHARGE/PLAN:  1. The patient was discharged home on January 29, 2009.  2. Discharge diagnoses, please see above.   3. Discharge meds:  OxyIR, Robaxin, Lovenox for a couple more days at      home and Coumadin.   DIET:  Heart-healthy diet.   FOLLOWUP:  Two weeks.  Call the office for an appointment.   DISPOSITION:  Home.   ACTIVITY:  Weightbearing as tolerated in both legs, total knee protocol.  Home health PT, home health nursing.  May start showering.  Do not  submerge the incision underwater.   CONDITION UPON DISCHARGE:  Improved.      Alexzandrew L. Perkins, P.A.C.      Ollen Gross, M.D.  Electronically Signed    ALP/MEDQ  D:  02/04/2009  T:  02/04/2009  Job:  [id number]   cc:   [name], M.D.  Fax: [phone]   Rollene Rotunda, MD, [facility]  [address]  [address]  [place]  [place] [address]

## 2013-01-07 ENCOUNTER — Other Ambulatory Visit: Payer: Self-pay

## 2013-01-07 MED ORDER — POTASSIUM CHLORIDE CRYS ER 20 MEQ PO TBCR: 20.0000 meq | EXTENDED_RELEASE_TABLET | Freq: Three times a day (TID) | ORAL | Status: DC

## 2013-01-07 MED ORDER — AMLODIPINE BESYLATE 10 MG PO TABS: 10.0000 mg | ORAL_TABLET | Freq: Every day | ORAL | Status: DC

## 2013-01-07 NOTE — Telephone Encounter (Signed)
Patient last seen 08/07/12   Mail order Rx's Print and have nurse call for patient to pick up

## 2013-01-13 ENCOUNTER — Telehealth: Payer: Self-pay | Admitting: Family Medicine

## 2013-01-28 ENCOUNTER — Other Ambulatory Visit: Payer: Self-pay | Admitting: Family Medicine

## 2013-01-28 NOTE — Telephone Encounter (Signed)
  Ricky [name] called to say  He only has 5  Doxazosin mesylate 20mg  pills left, he called his pharmacy in [facility] and asked them to notify us but also felt he needed to call since he is so far from home.

## 2013-02-05 ENCOUNTER — Encounter: Payer: Self-pay | Admitting: Gastroenterology

## 2013-02-21 ENCOUNTER — Telehealth: Payer: Self-pay | Admitting: *Deleted

## 2013-02-21 NOTE — Telephone Encounter (Signed)
Ricky [name] called to say he was out of his amlodipine and needed [facility] [place], Dr  Christell Constant oked the script and [facility]

## 2013-02-23 ENCOUNTER — Other Ambulatory Visit: Payer: Self-pay | Admitting: Family Medicine

## 2013-02-24 ENCOUNTER — Telehealth: Payer: Self-pay | Admitting: *Deleted

## 2013-02-24 NOTE — Telephone Encounter (Addendum)
Ricky [name] called requesting a 90 day supply of his doxazosin to be called in to his pharmacy in [facility], [place].  He also wants a 90 day supply rx of his losartin mailed to him so he can send it in to his mail order company. Called in refill on Doxazosin  2mg  1 q day x 90 with 3 ref[facility]. And mailed to [facility] a rx for Losartin 100 mg to send to his mail order co.

## 2013-02-24 NOTE — Telephone Encounter (Signed)
This is okay to do and you can put refills on this so he has a years supply

## 2013-03-13 ENCOUNTER — Telehealth: Payer: Self-pay | Admitting: *Deleted

## 2013-03-13 NOTE — Telephone Encounter (Signed)
Ricky [name] left his medication K-Dur at [facility] and he is at [facility] he just got  A 90 day supply from ins so I told him he would probably need to just pay out of pocket.  He [facility] but he wants [place] to mail him an RX. Cam you fix?

## 2013-03-18 NOTE — Telephone Encounter (Signed)
error 

## 2013-04-03 ENCOUNTER — Other Ambulatory Visit: Payer: Self-pay | Admitting: *Deleted

## 2013-04-03 MED ORDER — AMLODIPINE BESYLATE 10 MG PO TABS: 10.0000 mg | ORAL_TABLET | Freq: Every day | ORAL | Status: DC

## 2013-04-03 NOTE — Telephone Encounter (Signed)
Last seen 10/13. This will print for mail order. Give script to Carren Rang when done

## 2013-04-07 ENCOUNTER — Other Ambulatory Visit: Payer: Self-pay | Admitting: *Deleted

## 2013-04-07 MED ORDER — AMLODIPINE BESYLATE 10 MG PO TABS: 10.0000 mg | ORAL_TABLET | Freq: Every day | ORAL | Status: DC

## 2013-04-17 ENCOUNTER — Telehealth: Payer: Self-pay | Admitting: Family Medicine

## 2013-04-18 NOTE — Telephone Encounter (Signed)
Derry Skill mailed on 04/14/13

## 2013-04-19 ENCOUNTER — Other Ambulatory Visit: Payer: Self-pay | Admitting: Family Medicine

## 2013-04-23 NOTE — Telephone Encounter (Signed)
rx mailed to [facility]

## 2013-04-25 ENCOUNTER — Telehealth: Payer: Self-pay | Admitting: Family Medicine

## 2013-04-25 ENCOUNTER — Encounter: Payer: Self-pay | Admitting: Gastroenterology

## 2013-04-25 MED ORDER — POTASSIUM CHLORIDE CRYS ER 20 MEQ PO TBCR: 20.0000 meq | EXTENDED_RELEASE_TABLET | Freq: Three times a day (TID) | ORAL | Status: DC

## 2013-04-25 NOTE — Telephone Encounter (Signed)
Med rf/printed- dwmm to sign Monday- then we can call pt

## 2013-05-05 NOTE — Telephone Encounter (Signed)
Rx mailed.

## 2013-05-23 ENCOUNTER — Other Ambulatory Visit: Payer: Self-pay

## 2013-05-23 NOTE — Telephone Encounter (Signed)
Last seen 07/21/12   DWM

## 2013-05-26 MED ORDER — POTASSIUM CHLORIDE CRYS ER 20 MEQ PO TBCR: 20.0000 meq | EXTENDED_RELEASE_TABLET | Freq: Three times a day (TID) | ORAL | Status: DC

## 2013-05-26 NOTE — Telephone Encounter (Signed)
dwm to address 

## 2013-05-27 ENCOUNTER — Telehealth: Payer: Self-pay

## 2013-05-27 MED ORDER — DOXAZOSIN MESYLATE 2 MG PO TABS: 2.0000 mg | ORAL_TABLET | Freq: Every day | ORAL | Status: DC

## 2013-05-27 MED ORDER — SILDENAFIL CITRATE 100 MG PO TABS: 50.0000 mg | ORAL_TABLET | Freq: Every day | ORAL | Status: DC | PRN

## 2013-05-27 NOTE — Telephone Encounter (Signed)
Pt called needing refills on Viagra and Doxazosin for mail order RXs mailed to pt

## 2013-05-28 NOTE — Telephone Encounter (Signed)
Joyce Gross taking care of this

## 2013-06-25 ENCOUNTER — Ambulatory Visit (INDEPENDENT_AMBULATORY_CARE_PROVIDER_SITE_OTHER): Payer: Medicare Other | Admitting: Family Medicine

## 2013-06-25 ENCOUNTER — Encounter: Payer: Self-pay | Admitting: Family Medicine

## 2013-06-25 VITALS — BP 142/85 | HR 69 | Temp 98.1°F | Ht 67.0 in | Wt 205.0 lb

## 2013-06-25 DIAGNOSIS — R32 Unspecified urinary incontinence: Secondary | ICD-10-CM

## 2013-06-25 DIAGNOSIS — E876 Hypokalemia: Secondary | ICD-10-CM

## 2013-06-25 DIAGNOSIS — B351 Tinea unguium: Secondary | ICD-10-CM

## 2013-06-25 DIAGNOSIS — I10 Essential (primary) hypertension: Secondary | ICD-10-CM

## 2013-06-25 DIAGNOSIS — Z79899 Other long term (current) drug therapy: Secondary | ICD-10-CM

## 2013-06-25 MED ORDER — TERBINAFINE HCL 250 MG PO TABS: 250.0000 mg | ORAL_TABLET | Freq: Every day | ORAL | Status: DC

## 2013-06-25 NOTE — Progress Notes (Signed)
  Subjective:    Patient ID: Ricky [name], male    DOB: [birth year], 72 y.o.   MRN: [id number]  HPI Patient returns to clinic today for followup and management of chronic medical problems. Problems include hypertension, arthralgias and hypokalemia. See also the review of system. He will be seeing the urologist tomorrow and also the gastroenterologist in preparation for a colonoscopy. He will return to clinic later this week week for his blood work.   Review of Systems  Constitutional: Negative.   HENT: Negative.   Eyes: Negative.   Respiratory: Negative.   Cardiovascular: Positive for leg swelling (legs and ankles).  Gastrointestinal: Negative.   Endocrine: Negative.   Genitourinary: Negative.  Penile discharge: hands are bad.       See Dr Vernie Ammons tomorrow  Musculoskeletal: Positive for arthralgias.  Skin:       Few new places - needs to be checked  Neurological: Positive for dizziness.  Hematological: Bruises/bleeds easily.  Psychiatric/Behavioral: Negative.        Objective:   Physical Exam BP 142/85  Pulse 69  Temp(Src) 98.1 F (36.7 C) (Oral)  Ht 5\' 7"  (1.702 m)  Wt 205 lb (92.987 kg)  BMI 32.1 kg/m2  The patient appeared well nourished and normally developed, alert and oriented to time and place. Speech, behavior and judgement appear normal. Vital signs as documented.  Head exam is unremarkable. No scleral icterus or pallor noted. Ears nose and throat were within normal limits Neck is without jugular venous distension, thyromegally, or carotid bruits. Carotid upstrokes are brisk bilaterally. No cervical adenopathy. Lungs are clear anteriorly and posteriorly to auscultation. Normal respiratory effort. Cardiac exam reveals regular rate and rhythm at 72 per minute. First and second heart sounds normal.  No murmurs, rubs or gallops.  Abdominal exam reveals normal bowl sounds, no masses, no organomegaly and no aortic enlargement. No inguinal adenopathy. There was no  abdominal tenderness Extremities are nonedematous and both femoral and pedal pulses are normal. Skin without pallor or jaundice.  Warm and dry, without rash. Neurologic exam reveals normal deep tendon reflexes and normal sensation.          Assessment & Plan:  1. HYPERTENSION, UNSPECIFIED - POCT CBC; Future - Hepatic function panel; Future - BMP8+EGFR; Future - NMR, lipoprofile; Future  2. Nail fungus - terbinafine (LAMISIL) 250 MG tablet; Take 1 tablet (250 mg total) by mouth daily.  Dispense: 90 tablet; Refill: 0  3. Incontinence of urine -Patient to see urologist tomorrow  4. Hypokalemia  5. History of colon polyps -Patient to see gastroenterologist tomorrow to arrange Colonoscopy  Patient Instructions  Continue current medications. Continue good therapeutic lifestyle changes.  Fall precautions discussed with patient.  Schedule your flu vaccine the first of October.  Follow up as planned and earlier as needed.     Nyra Capes MD

## 2013-06-25 NOTE — Patient Instructions (Addendum)
Continue current medications. Continue good therapeutic lifestyle changes.  Fall precautions discussed with patient. Schedule your flu vaccine the first of October. Follow up as planned and earlier as needed.   

## 2013-06-25 NOTE — Addendum Note (Signed)
Addended by: Magdalene River on: 06/25/2013 05:08 PM   Modules accepted: Orders

## 2013-06-26 ENCOUNTER — Ambulatory Visit (AMBULATORY_SURGERY_CENTER): Payer: Self-pay | Admitting: *Deleted

## 2013-06-26 VITALS — Ht 67.0 in | Wt 206.8 lb

## 2013-06-26 DIAGNOSIS — Z86010 Personal history of colonic polyps: Secondary | ICD-10-CM

## 2013-06-26 MED ORDER — MOVIPREP 100 G PO SOLR: 1.0000 | Freq: Once | ORAL | Status: DC

## 2013-06-26 NOTE — Progress Notes (Signed)
Denies allergies to eggs or soy products. Denies complications with sedation or anesthesia. 

## 2013-06-27 ENCOUNTER — Other Ambulatory Visit (INDEPENDENT_AMBULATORY_CARE_PROVIDER_SITE_OTHER): Payer: Medicare Other

## 2013-06-27 DIAGNOSIS — B351 Tinea unguium: Secondary | ICD-10-CM

## 2013-06-27 DIAGNOSIS — Z79899 Other long term (current) drug therapy: Secondary | ICD-10-CM

## 2013-06-27 DIAGNOSIS — I10 Essential (primary) hypertension: Secondary | ICD-10-CM

## 2013-06-27 LAB — POCT CBC
HCT, POC: 44.3 % (ref 43.5–53.7)
MCH, POC: 29.3 pg (ref 27–31.2)
MCV: 85.2 fL (ref 80–97)
POC LYMPH PERCENT: 27.4 %L (ref 10–50)
Platelet Count, POC: 209 10*3/uL (ref 142–424)
RBC: 5.2 M/uL (ref 4.69–6.13)
RDW, POC: 13 %
WBC: 5.1 10*3/uL (ref 4.6–10.2)

## 2013-06-29 LAB — BMP8+EGFR
BUN/Creatinine Ratio: 13 (ref 10–22)
CO2: 27 mmol/L (ref 18–29)
Calcium: 9.7 mg/dL (ref 8.6–10.2)
Chloride: 102 mmol/L (ref 97–108)
Creatinine, Ser: 1.07 mg/dL (ref 0.76–1.27)
Sodium: 143 mmol/L (ref 134–144)

## 2013-06-29 LAB — NMR, LIPOPROFILE
HDL Cholesterol by NMR: 57 mg/dL (ref >=40)
HDL Particle Number: 36 umol/L (ref >=30.5)
LDL Size: 21 nm (ref >20.5)
LP-IR Score: 28 (ref <=45)
Small LDL Particle Number: 481 nmol/L (ref <=527)

## 2013-06-29 LAB — HEPATIC FUNCTION PANEL
ALT: 21 IU/L (ref 0–44)
AST: 17 IU/L (ref 0–40)
Alkaline Phosphatase: 105 IU/L (ref 39–117)
Total Bilirubin: 0.6 mg/dL (ref 0.0–1.2)

## 2013-07-16 ENCOUNTER — Other Ambulatory Visit: Payer: Self-pay | Admitting: Family Medicine

## 2013-07-17 MED ORDER — AMLODIPINE BESYLATE 10 MG PO TABS: 10.0000 mg | ORAL_TABLET | Freq: Every day | ORAL | Status: DC

## 2013-07-17 NOTE — Telephone Encounter (Signed)
When this rx is printed mail to pt at his address

## 2013-07-23 ENCOUNTER — Telehealth: Payer: Self-pay | Admitting: Family Medicine

## 2013-07-23 MED ORDER — AMLODIPINE BESYLATE 10 MG PO TABS: 10.0000 mg | ORAL_TABLET | Freq: Every day | ORAL | Status: DC

## 2013-07-23 NOTE — Telephone Encounter (Signed)
Pt here in [place] [facility] now - needs med printed for pick up

## 2013-08-06 ENCOUNTER — Encounter: Payer: Self-pay | Admitting: Gastroenterology

## 2013-08-08 ENCOUNTER — Encounter: Payer: Self-pay | Admitting: Gastroenterology

## 2013-08-08 ENCOUNTER — Ambulatory Visit (AMBULATORY_SURGERY_CENTER): Payer: Medicare Other | Admitting: Gastroenterology

## 2013-08-08 VITALS — BP 143/83 | HR 58 | Temp 96.6°F | Resp 17 | Ht 67.0 in | Wt 206.0 lb

## 2013-08-08 DIAGNOSIS — Z860100 Personal history of colon polyps, unspecified: Secondary | ICD-10-CM

## 2013-08-08 DIAGNOSIS — K5730 Diverticulosis of large intestine without perforation or abscess without bleeding: Secondary | ICD-10-CM

## 2013-08-08 MED ORDER — SODIUM CHLORIDE 0.9 % IV SOLN: 500.0000 mL | INTRAVENOUS | Status: DC

## 2013-08-08 NOTE — Op Note (Signed)
[facility] [address] [place], [address]   COLONOSCOPY PROCEDURE REPORT  PATIENT: Ricky [name], Ricky [name]  MR#: [id number] BIRTHDATE: Apr 07, 1941 , 72  yrs. old GENDER: Male ENDOSCOPIST: Mardella Layman, MD, [facility] REFERRED XB:JYNW Joellen Jersey, M.D, Ph.D. PROCEDURE DATE:  08/08/2013 PROCEDURE:   Colonoscopy, surveillance First Screening Colonoscopy - Avg.  risk and is 50 yrs.  old or older - No.  Prior Negative Screening - Now for repeat screening. N/A  History of Adenoma - Now for follow-up colonoscopy & has been > or = to 3 yrs.  Yes hx of adenoma.  Has been 3 or more years since last colonoscopy. ASA CLASS:   Class II INDICATIONS:Patient's personal history of adenomatous colon polyps.  MEDICATIONS: propofol (Diprivan) 200mg  IV  DESCRIPTION OF PROCEDURE:   After the risks benefits and alternatives of the procedure were thoroughly explained, informed consent was obtained.  A digital rectal exam revealed no abnormalities of the rectum.   The LB [id number]  endoscope was introduced through the anus and advanced to the cecum, which was identified by both the appendix and ileocecal valve. No adverse events experienced.   The quality of the prep was excellent, using MoviPrep  The instrument was then slowly withdrawn as the colon was fully examined.      COLON FINDINGS: Mild diverticulosis was noted in the sigmoid colon. The colon was otherwise normal.  There was no diverticulosis, inflammation, polyps or cancers unless previously stated. Retroflexed views revealed no abnormalities. The time to cecum=4 minutes 5 seconds.  Withdrawal time=7 minutes 33 seconds.  The scope was withdrawn and the procedure completed. COMPLICATIONS: There were no complications.  ENDOSCOPIC IMPRESSION: 1.   Mild diverticulosis was noted in the sigmoid colon 2.   The colon was otherwise normal  RECOMMENDATIONS: 1.  Repeat Colonoscopy in 5 years. 2.  High fiber diet   eSigned:   Mardella Layman, MD, [facility] 08/08/2013 1:43 PM   cc:

## 2013-08-08 NOTE — Patient Instructions (Signed)
YOU HAD AN ENDOSCOPIC PROCEDURE TODAY AT THE [facility]: Refer to the procedure report that was given to you for any specific questions about what was found during the examination.  If the procedure report does not answer your questions, please call your gastroenterologist to clarify.  If you requested that your care partner not be given the details of your procedure findings, then the procedure report has been included in a sealed envelope for you to review at your convenience later.  YOU SHOULD EXPECT: Some feelings of bloating in the abdomen. Passage of more gas than usual.  Walking can help get rid of the air that was put into your GI tract during the procedure and reduce the bloating. If you had a lower endoscopy (such as a colonoscopy or flexible sigmoidoscopy) you may notice spotting of blood in your stool or on the toilet paper. If you underwent a bowel prep for your procedure, then you may not have a normal bowel movement for a few days.  DIET: Your first meal following the procedure should be a light meal and then it is ok to progress to your normal diet.  A half-sandwich or bowl of soup is an example of a good first meal.  Heavy or fried foods are harder to digest and may make you feel nauseous or bloated.  Likewise meals heavy in dairy and vegetables can cause extra gas to form and this can also increase the bloating.  Drink plenty of fluids but you should avoid alcoholic beverages for 24 hours.  ACTIVITY: Your care partner should take you home directly after the procedure.  You should plan to take it easy, moving slowly for the rest of the day.  You can resume normal activity the day after the procedure however you should NOT DRIVE or use heavy machinery for 24 hours (because of the sedation medicines used during the test).    SYMPTOMS TO REPORT IMMEDIATELY: A gastroenterologist can be reached at any hour.  During normal business hours, 8:30 AM to 5:00 PM Monday through Friday,  call [phone].  After hours and on weekends, please call the GI answering service at [phone] who will take a message and have the physician on call contact you.   Following lower endoscopy (colonoscopy or flexible sigmoidoscopy):  Excessive amounts of blood in the stool  Significant tenderness or worsening of abdominal pains  Swelling of the abdomen that is new, acute  Fever of 100F or higher    FOLLOW UP: If any biopsies were taken you will be contacted by phone or by letter within the next 1-3 weeks.  Call your gastroenterologist if you have not heard about the biopsies in 3 weeks.  Our staff will call the home number listed on your records the next business day following your procedure to check on you and address any questions or concerns that you may have at that time regarding the information given to you following your procedure. This is a courtesy call and so if there is no answer at the home number and we have not heard from you through the emergency physician on call, we will assume that you have returned to your regular daily activities without incident.  SIGNATURES/CONFIDENTIALITY: You and/or your care partner have signed paperwork which will be entered into your electronic medical record.  These signatures attest to the fact that that the information above on your After Visit Summary has been reviewed and is understood.  Full responsibility of the confidentiality   of this discharge information lies with you and/or your care-partner.     Information on diverticulosis & high fiber diet given to you today 

## 2013-08-08 NOTE — Progress Notes (Signed)
Patient did not experience any of the following events: a burn prior to discharge; a fall within the facility; wrong site/side/patient/procedure/implant event; or a hospital transfer or hospital admission upon discharge from the facility. (G8907) Patient did not have preoperative order for IV antibiotic SSI prophylaxis. (G8918)  

## 2013-08-11 ENCOUNTER — Telehealth: Payer: Self-pay

## 2013-08-11 NOTE — Telephone Encounter (Signed)
  Follow up Call-  Call back number 08/08/2013  Post procedure Call Back phone  # [phone]  Permission to leave phone message Yes     Patient questions:  Do you have a fever, pain , or abdominal swelling? no Pain Score  0 *  Have you tolerated food without any problems? yes  Have you been able to return to your normal activities? yes  Do you have any questions about your discharge instructions: Diet   no Medications  no Follow up visit  no  Do you have questions or concerns about your Care? no  Actions: * If pain score is 4 or above: No action needed, pain <4.

## 2013-08-13 ENCOUNTER — Other Ambulatory Visit: Payer: Self-pay

## 2013-08-13 MED ORDER — POTASSIUM CHLORIDE CRYS ER 20 MEQ PO TBCR: 20.0000 meq | EXTENDED_RELEASE_TABLET | Freq: Three times a day (TID) | ORAL | Status: DC

## 2013-09-25 ENCOUNTER — Ambulatory Visit: Payer: Medicare Other | Admitting: Podiatry

## 2013-09-29 ENCOUNTER — Encounter: Payer: Self-pay | Admitting: Podiatry

## 2013-09-29 ENCOUNTER — Ambulatory Visit (INDEPENDENT_AMBULATORY_CARE_PROVIDER_SITE_OTHER): Payer: Medicare Other | Admitting: Podiatry

## 2013-09-29 VITALS — BP 149/88 | HR 70 | Ht 67.0 in | Wt 190.0 lb

## 2013-09-29 DIAGNOSIS — B351 Tinea unguium: Secondary | ICD-10-CM

## 2013-09-29 DIAGNOSIS — M79609 Pain in unspecified limb: Secondary | ICD-10-CM

## 2013-09-29 NOTE — Progress Notes (Signed)
Patient ID: Ricky [name], male   DOB: 1941-09-20, 72 y.o.   MRN: [id number]  Subjective: Patient is an is complaining of painful toenails especially hallux nail margins. He is interested in possible permanent nail surgery in the future returns from vacation.  Objective: Orientated x19 72 year old white male  Vascular: DP and PT pulses two over four bilaterally  Dermatological: Elongated, incurvated, discolored toenails x10. Maximum symptoms in the hallux nail margins bilaterally  Plan: All 10 toenails were debrided back without any bleeding. Reappoint at patient's request for debridement or possible phenol matricectomy to the hallux nails.

## 2013-10-01 ENCOUNTER — Ambulatory Visit (INDEPENDENT_AMBULATORY_CARE_PROVIDER_SITE_OTHER): Payer: Medicare Other | Admitting: Family Medicine

## 2013-10-01 ENCOUNTER — Encounter: Payer: Self-pay | Admitting: Family Medicine

## 2013-10-01 VITALS — BP 140/81 | HR 57 | Temp 97.0°F | Ht 67.0 in | Wt 197.0 lb

## 2013-10-01 DIAGNOSIS — I10 Essential (primary) hypertension: Secondary | ICD-10-CM

## 2013-10-01 DIAGNOSIS — N529 Male erectile dysfunction, unspecified: Secondary | ICD-10-CM

## 2013-10-01 DIAGNOSIS — J209 Acute bronchitis, unspecified: Secondary | ICD-10-CM

## 2013-10-01 DIAGNOSIS — Z79899 Other long term (current) drug therapy: Secondary | ICD-10-CM

## 2013-10-01 DIAGNOSIS — N400 Enlarged prostate without lower urinary tract symptoms: Secondary | ICD-10-CM

## 2013-10-01 DIAGNOSIS — B351 Tinea unguium: Secondary | ICD-10-CM

## 2013-10-01 MED ORDER — AZITHROMYCIN 250 MG PO TABS: ORAL_TABLET | ORAL | Status: DC

## 2013-10-01 NOTE — Addendum Note (Signed)
Addended by: Orma Render F on: 10/01/2013 10:58 AM   Modules accepted: Orders

## 2013-10-01 NOTE — Progress Notes (Signed)
Subjective:    Patient ID: Ricky [name], male    DOB: September 01, 1941, 72 y.o.   MRN: [id number]  HPI Patient here today for follow up on nail fungus and to recheck LFT's. Patient also has a history of hypertension BPH and hypokalemia. He follows his blood pressures at home. The patient is coughing and has been doing so for about a month. He [place] and was given an inhaler. He is taking 3 months worth of Lamisil. His nails are much improved.     Patient Active Problem List   Diagnosis Date Noted  . PERSONAL HX COLONIC POLYPS 01/25/2010  . 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 HYPERTENSION, UNSPECIFIED 01/13/2009   Outpatient Encounter Prescriptions as of 10/01/2013  Medication Sig  . amLODipine (NORVASC) 10 MG tablet Take 1 tablet (10 mg total) by mouth daily.  . Cholecalciferol (VITAMIN D PO) Take by mouth.  . doxazosin (CARDURA) 2 MG tablet Take 1 tablet (2 mg total) by mouth at bedtime.  Marland Kitchen FIBER FORMULA PO Take 3 capsules by mouth 2 (two) times daily.  Marland Kitchen losartan (COZAAR) 100 MG tablet Take 100 mg by mouth daily.  . meloxicam (MOBIC) 7.5 MG tablet Take 7.5 mg by mouth daily.  . potassium chloride SA (K-DUR,KLOR-CON) 20 MEQ tablet Take 1 tablet (20 mEq total) by mouth 3 (three) times daily.  . sildenafil (VIAGRA) 100 MG tablet Take 0.5-1 tablets (50-100 mg total) by mouth daily as needed for erectile dysfunction.  . tamsulosin (FLOMAX) 0.4 MG CAPS capsule Take 0.4 mg by mouth 2 (two) times daily.  Marland Kitchen terbinafine (LAMISIL) 250 MG tablet Take 1 tablet (250 mg total) by mouth daily.    Review of Systems  Constitutional: Negative.   HENT: Negative.   Eyes: Negative.   Respiratory: Positive for cough (little).   Cardiovascular: Negative.   Gastrointestinal: Negative.   Endocrine: Negative.   Genitourinary: Negative.   Musculoskeletal: Negative.   Skin: Negative.        Nail fungus  Allergic/Immunologic: Negative.     Neurological: Negative.   Hematological: Negative.   Psychiatric/Behavioral: Negative.        Objective:   Physical Exam  Nursing note and vitals reviewed. Constitutional: He is oriented to person, place, and time. He appears well-developed and well-nourished. No distress.  HENT:  Head: Normocephalic and atraumatic.  Right Ear: External ear normal.  Left Ear: External ear normal.  Mouth/Throat: Oropharynx is clear and moist. No oropharyngeal exudate.  Nasal congestion bilaterally. Some yellow drainage was noted in the throat.  Eyes: Conjunctivae and EOM are normal. Pupils are equal, round, and reactive to light. Right eye exhibits no discharge. Left eye exhibits no discharge. No scleral icterus.  Neck: Normal range of motion. Neck supple. No thyromegaly present.  Cardiovascular: Normal rate, regular rhythm, normal heart sounds and intact distal pulses.   No murmur heard. At 72 per minute  Pulmonary/Chest: Effort normal. No respiratory distress. He has no wheezes. He has no rales. He exhibits no tenderness.  Coarse rhonchi with coughing, no axillary node  Abdominal: Soft. Bowel sounds are normal. He exhibits no mass. There is no tenderness. There is no rebound and no guarding.  Genitourinary: Rectum normal.  Musculoskeletal: Normal range of motion. He exhibits no edema and no tenderness.  Lymphadenopathy:    He has no cervical adenopathy.  Neurological: He is alert and oriented to person, place, and time. He has normal reflexes. No  cranial nerve deficit.  Skin: Skin is warm and dry. No rash noted. No erythema. No pallor.  Nails and nailbeds are clear of any fungus  Psychiatric: He has a normal mood and affect. His behavior is normal. Judgment and thought content normal.   BP 140/81  Pulse 57  Temp(Src) 97 F (36.1 C) (Oral)  Ht 5\' 7"  (1.702 m)  Wt 197 lb (89.359 kg)  BMI 30.85 kg/m2        Assessment & Plan:  1. Nail fungus - Hepatic function panel - BMP8+EGFR  2.  High risk medication use - BMP8+EGFR  3. BPH (benign prostatic hyperplasia)  4. Erectile dysfunction  5. Acute bronchitis - azithromycin (ZITHROMAX) 250 MG tablet; 2 the first day then one daily for infection until completed  Dispense: 6 tablet; Refill: 0  6. Unspecified essential hypertension  Patient Instructions  Continue current medications. Continue good therapeutic lifestyle changes which include good diet and exercise. Fall precautions discussed with patient. Do not take anymore Lamisil and she had been taking it for 3 months and that is the limit of treatment for nail fungus Continue to drink plenty of fluids, use saline nose spray for nasal congestion, he can purchase Mucinex maximum strength over-the-counter, blue and white in color one twice daily with a large glass of water for cough and congestion When you are feeling better from the bronchitis you need to come back and get a Prevnar vaccine injection   Nyra Capes MD

## 2013-10-01 NOTE — Patient Instructions (Addendum)
Continue current medications. Continue good therapeutic lifestyle changes which include good diet and exercise. Fall precautions discussed with patient. Do not take anymore Lamisil and she had been taking it for 3 months and that is the limit of treatment for nail fungus Continue to drink plenty of fluids, use saline nose spray for nasal congestion, he can purchase Mucinex maximum strength over-the-counter, blue and white in color one twice daily with a large glass of water for cough and congestion When you are feeling better from the bronchitis you need to come back and get a Prevnar vaccine injection

## 2013-10-02 LAB — CBC WITH DIFFERENTIAL
Basophils Absolute: 0 10*3/uL (ref 0.0–0.2)
Eosinophils Absolute: 0.2 10*3/uL (ref 0.0–0.4)
HCT: 43.1 % (ref 37.5–51.0)
Hemoglobin: 14.3 g/dL (ref 12.6–17.7)
Lymphocytes Absolute: 1.1 10*3/uL (ref 0.7–3.1)
Lymphs: 28 %
MCH: 28.7 pg (ref 26.6–33.0)
MCHC: 33.2 g/dL (ref 31.5–35.7)
Neutrophils Absolute: 2.2 10*3/uL (ref 1.4–7.0)
Platelets: 178 10*3/uL (ref 150–379)
RDW: 14.2 % (ref 12.3–15.4)

## 2013-10-02 LAB — HEPATIC FUNCTION PANEL
ALT: 20 IU/L (ref 0–44)
AST: 15 IU/L (ref 0–40)
Alkaline Phosphatase: 82 IU/L (ref 39–117)
Bilirubin, Direct: 0.16 mg/dL (ref 0.00–0.40)
Total Bilirubin: 0.6 mg/dL (ref 0.0–1.2)

## 2013-10-02 LAB — BMP8+EGFR
Calcium: 9.4 mg/dL (ref 8.6–10.2)
Chloride: 102 mmol/L (ref 97–108)
GFR calc non Af Amer: 67 mL/min/{1.73_m2} (ref >59)
Potassium: 3.6 mmol/L (ref 3.5–5.2)
Sodium: 143 mmol/L (ref 134–144)

## 2013-10-17 ENCOUNTER — Telehealth: Payer: Self-pay | Admitting: *Deleted

## 2013-10-27 ENCOUNTER — Encounter: Payer: Self-pay | Admitting: *Deleted

## 2013-12-10 ENCOUNTER — Other Ambulatory Visit: Payer: Self-pay

## 2013-12-10 MED ORDER — POTASSIUM CHLORIDE CRYS ER 20 MEQ PO TBCR: 20.0000 meq | EXTENDED_RELEASE_TABLET | Freq: Three times a day (TID) | ORAL | Status: DC

## 2014-01-21 ENCOUNTER — Telehealth: Payer: Self-pay | Admitting: Family Medicine

## 2014-01-21 NOTE — Telephone Encounter (Signed)
Patient aware that Dr. Laurance Flatten has no available appts today or tomorrow

## 2014-01-22 ENCOUNTER — Ambulatory Visit (INDEPENDENT_AMBULATORY_CARE_PROVIDER_SITE_OTHER): Payer: Medicare Other | Admitting: Podiatry

## 2014-01-22 DIAGNOSIS — M79609 Pain in unspecified limb: Secondary | ICD-10-CM

## 2014-01-22 DIAGNOSIS — B351 Tinea unguium: Secondary | ICD-10-CM

## 2014-01-23 NOTE — Progress Notes (Signed)
Subjective:     Patient ID: Ricky [name], male   DOB: 1941-03-07, 73 y.o.   MRN: [id number]  HPI patient presents with thick nailbeds 1-5 both feet that are painful and impossible for him to cut   Review of Systems     Objective:   Physical Exam Brittle nailbeds that are thick and painful 1-5 on both feet    Assessment:     Mycotic nail infection with pain 1-5 both feet    Plan:     Debridement painful nail bed 1-5 both feet with no iatrogenic bleeding noted

## 2014-03-03 ENCOUNTER — Other Ambulatory Visit: Payer: Self-pay

## 2014-03-03 MED ORDER — LOSARTAN POTASSIUM 100 MG PO TABS: 100.0000 mg | ORAL_TABLET | Freq: Every day | ORAL | Status: DC

## 2014-03-09 ENCOUNTER — Other Ambulatory Visit: Payer: Self-pay

## 2014-03-09 MED ORDER — LOSARTAN POTASSIUM 100 MG PO TABS: 100.0000 mg | ORAL_TABLET | Freq: Every day | ORAL | Status: DC

## 2014-03-09 NOTE — Telephone Encounter (Signed)
Last seen 10/01/13  [facility]  THis is for mail order

## 2014-03-12 ENCOUNTER — Ambulatory Visit (INDEPENDENT_AMBULATORY_CARE_PROVIDER_SITE_OTHER): Payer: Medicare Other | Admitting: Family Medicine

## 2014-03-12 ENCOUNTER — Encounter: Payer: Self-pay | Admitting: Family Medicine

## 2014-03-12 VITALS — BP 151/86 | HR 60 | Temp 98.7°F | Ht 67.0 in | Wt 197.0 lb

## 2014-03-12 DIAGNOSIS — M5136 Other intervertebral disc degeneration, lumbar region: Secondary | ICD-10-CM

## 2014-03-12 DIAGNOSIS — E559 Vitamin D deficiency, unspecified: Secondary | ICD-10-CM

## 2014-03-12 DIAGNOSIS — M5137 Other intervertebral disc degeneration, lumbosacral region: Secondary | ICD-10-CM

## 2014-03-12 DIAGNOSIS — L989 Disorder of the skin and subcutaneous tissue, unspecified: Secondary | ICD-10-CM

## 2014-03-12 DIAGNOSIS — I10 Essential (primary) hypertension: Secondary | ICD-10-CM

## 2014-03-12 NOTE — Progress Notes (Signed)
Subjective:    Patient ID: Ricky [name], male    DOB: 1941/01/06, 73 y.o.   MRN: [id number]  HPI Patient here today for back pain. He has recently seen [facility], we will gets records. The records were obtained and the x-ray showed degenerative disc changes in the lumbar spine. The patient indicates he did a lot of extra work this past winter and his pain was worse and that work has declined and he is doing less of it now and the pain is better. He does have high blood pressure and has had both knees replaced. He indicates his blood pressures at home are running in the 130s over the 70s. He still has nocturia secondary to BPH.          Patient Active Problem List   Diagnosis Date Noted  . BPH (benign prostatic hyperplasia) 10/01/2013  . Erectile dysfunction 10/01/2013  . PERSONAL HX COLONIC POLYPS 01/25/2010  . 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 HYPERTENSION, UNSPECIFIED 01/13/2009   Outpatient Encounter Prescriptions as of 03/12/2014  Medication Sig  . amLODipine (NORVASC) 10 MG tablet Take 1 tablet (10 mg total) by mouth daily.  . Cholecalciferol (VITAMIN D PO) Take by mouth.  . doxazosin (CARDURA) 2 MG tablet Take 1 tablet (2 mg total) by mouth at bedtime.  Marland Kitchen FIBER FORMULA PO Take 3 capsules by mouth 2 (two) times daily.  Marland Kitchen losartan (COZAAR) 100 MG tablet Take 1 tablet (100 mg total) by mouth daily.  . meloxicam (MOBIC) 7.5 MG tablet Take 7.5 mg by mouth daily.  . potassium chloride SA (K-DUR,KLOR-CON) 20 MEQ tablet Take 1 tablet (20 mEq total) by mouth 3 (three) times daily.  . sildenafil (VIAGRA) 100 MG tablet Take 0.5-1 tablets (50-100 mg total) by mouth daily as needed for erectile dysfunction.  . tamsulosin (FLOMAX) 0.4 MG CAPS capsule Take 0.4 mg by mouth 2 (two) times daily.  Marland Kitchen terbinafine (LAMISIL) 250 MG tablet Take 1 tablet (250 mg total) by mouth daily.  . [DISCONTINUED] azithromycin (ZITHROMAX) 250 MG tablet 2 the first day then one  daily for infection until completed    Review of Systems  Constitutional: Negative.   HENT: Negative.   Eyes: Negative.   Respiratory: Negative.   Cardiovascular: Negative.   Gastrointestinal: Negative.   Endocrine: Negative.   Genitourinary: Negative.   Musculoskeletal: Positive for back pain. Arthralgias: pain radiates to the stomach area.  Skin: Negative.   Allergic/Immunologic: Negative.   Neurological: Negative.   Hematological: Negative.   Psychiatric/Behavioral: Negative.        Objective:   Physical Exam  Nursing note and vitals reviewed. Constitutional: He is oriented to person, place, and time. He appears well-developed and well-nourished. No distress.  HENT:  Head: Normocephalic and atraumatic.  Eyes: Conjunctivae and EOM are normal. Pupils are equal, round, and reactive to light. Right eye exhibits no discharge. Left eye exhibits no discharge. No scleral icterus.  Neck: Normal range of motion. Neck supple. No thyromegaly present.  Cardiovascular: Normal rate, regular rhythm, normal heart sounds and intact distal pulses.  Exam reveals no gallop and no friction rub.   No murmur heard. Pulmonary/Chest: Effort normal and breath sounds normal. No respiratory distress. He has no wheezes. He has no rales. He exhibits no tenderness.  Abdominal: Soft. Bowel sounds are normal. He exhibits no mass. There is no tenderness. There is no rebound and no guarding.  Musculoskeletal: Normal range of motion. He exhibits no edema and no tenderness.  Lymphadenopathy:    He has no cervical adenopathy.  Neurological: He is alert and oriented to person, place, and time. No cranial nerve deficit.  Skin: Skin is warm and dry. No rash noted.  Suspicious skin lesion left cheek  Psychiatric: He has a normal mood and affect. His behavior is normal. Judgment and thought content normal.   BP 151/86  Pulse 60  Temp(Src) 98.7 F (37.1 C) (Oral)  Ht 5\' 7"  (1.702 m)  Wt 197 lb (89.359 kg)  BMI  30.85 kg/m2        Assessment & Plan:   1. Vitamin D deficiency  2. Degenerative disc disease, lumbar  3. Hypertension  4. Skin lesion of face -Referral to dermatology  Patient Instructions  Take mobic daily for pain- if not severe then take extra strength tylenol, take Mobic after eating and only take it for short period of time No pushing or pulling (straining the back) Warm wet compresses Call Dr Ledell Peoples office- Dermotology    Arrie Senate MD

## 2014-03-12 NOTE — Patient Instructions (Addendum)
Take mobic daily for pain- if not severe then take extra strength tylenol, take Mobic after eating and only take it for short period of time No pushing or pulling (straining the back) Warm wet compresses Call Dr Ledell Peoples office- Dermotology

## 2014-03-20 ENCOUNTER — Other Ambulatory Visit: Payer: Self-pay | Admitting: *Deleted

## 2014-03-20 MED ORDER — POTASSIUM CHLORIDE CRYS ER 20 MEQ PO TBCR: 20.0000 meq | EXTENDED_RELEASE_TABLET | Freq: Three times a day (TID) | ORAL | Status: DC

## 2014-03-30 ENCOUNTER — Encounter: Payer: Self-pay | Admitting: Gastroenterology

## 2014-04-27 ENCOUNTER — Other Ambulatory Visit: Payer: Self-pay | Admitting: *Deleted

## 2014-04-27 MED ORDER — DOXAZOSIN MESYLATE 2 MG PO TABS: 2.0000 mg | ORAL_TABLET | Freq: Every day | ORAL | Status: DC

## 2014-05-25 ENCOUNTER — Other Ambulatory Visit: Payer: Self-pay | Admitting: *Deleted

## 2014-05-25 MED ORDER — POTASSIUM CHLORIDE CRYS ER 20 MEQ PO TBCR: 20.0000 meq | EXTENDED_RELEASE_TABLET | Freq: Three times a day (TID) | ORAL | Status: DC

## 2014-05-25 MED ORDER — LOSARTAN POTASSIUM 100 MG PO TABS: 100.0000 mg | ORAL_TABLET | Freq: Every day | ORAL | Status: DC

## 2014-05-25 NOTE — Telephone Encounter (Signed)
Meds verified with pt and he does want to order from [facility] mail order.

## 2014-06-11 ENCOUNTER — Other Ambulatory Visit: Payer: Self-pay | Admitting: *Deleted

## 2014-06-11 MED ORDER — AMLODIPINE BESYLATE 10 MG PO TABS: 10.0000 mg | ORAL_TABLET | Freq: Every day | ORAL | Status: DC

## 2014-07-02 ENCOUNTER — Ambulatory Visit (INDEPENDENT_AMBULATORY_CARE_PROVIDER_SITE_OTHER): Payer: Medicare Other

## 2014-07-02 ENCOUNTER — Ambulatory Visit: Payer: Medicare Other | Admitting: Family Medicine

## 2014-07-02 ENCOUNTER — Encounter: Payer: Self-pay | Admitting: Family Medicine

## 2014-07-02 ENCOUNTER — Ambulatory Visit (INDEPENDENT_AMBULATORY_CARE_PROVIDER_SITE_OTHER): Payer: Medicare Other | Admitting: Family Medicine

## 2014-07-02 VITALS — BP 130/83 | HR 60 | Temp 97.8°F | Ht 67.0 in | Wt 203.0 lb

## 2014-07-02 DIAGNOSIS — H8303 Labyrinthitis, bilateral: Secondary | ICD-10-CM

## 2014-07-02 DIAGNOSIS — N400 Enlarged prostate without lower urinary tract symptoms: Secondary | ICD-10-CM

## 2014-07-02 DIAGNOSIS — J209 Acute bronchitis, unspecified: Secondary | ICD-10-CM

## 2014-07-02 DIAGNOSIS — H8309 Labyrinthitis, unspecified ear: Secondary | ICD-10-CM

## 2014-07-02 DIAGNOSIS — I10 Essential (primary) hypertension: Secondary | ICD-10-CM

## 2014-07-02 DIAGNOSIS — R05 Cough: Secondary | ICD-10-CM

## 2014-07-02 DIAGNOSIS — J301 Allergic rhinitis due to pollen: Secondary | ICD-10-CM

## 2014-07-02 DIAGNOSIS — H6123 Impacted cerumen, bilateral: Secondary | ICD-10-CM

## 2014-07-02 DIAGNOSIS — H6120 Impacted cerumen, unspecified ear: Secondary | ICD-10-CM

## 2014-07-02 DIAGNOSIS — E559 Vitamin D deficiency, unspecified: Secondary | ICD-10-CM

## 2014-07-02 DIAGNOSIS — R059 Cough, unspecified: Secondary | ICD-10-CM

## 2014-07-02 LAB — POCT CBC
Granulocyte percent: 66.2 %G (ref 37–80)
HEMATOCRIT: 41.8 % — AB (ref 43.5–53.7)
Hemoglobin: 13.9 g/dL — AB (ref 14.1–18.1)
Lymph, poc: 1.3 (ref 0.6–3.4)
MCH, POC: 28.5 pg (ref 27–31.2)
MCHC: 33.3 g/dL (ref 31.8–35.4)
MCV: 85.6 fL (ref 80–97)
MPV: 7 fL (ref 0–99.8)
POC Granulocyte: 3.1 (ref 2–6.9)
POC LYMPH PERCENT: 28.4 %L (ref 10–50)
Platelet Count, POC: 229 10*3/uL (ref 142–424)
RBC: 4.9 M/uL (ref 4.69–6.13)
RDW, POC: 13.1 %
WBC: 4.7 10*3/uL (ref 4.6–10.2)

## 2014-07-02 MED ORDER — ALBUTEROL SULFATE HFA 108 (90 BASE) MCG/ACT IN AERS: 1.0000 | INHALATION_SPRAY | RESPIRATORY_TRACT | Status: DC | PRN

## 2014-07-02 MED ORDER — BUDESONIDE-FORMOTEROL FUMARATE 160-4.5 MCG/ACT IN AERO: 2.0000 | INHALATION_SPRAY | Freq: Two times a day (BID) | RESPIRATORY_TRACT | Status: DC

## 2014-07-02 MED ORDER — METHYLPREDNISOLONE ACETATE 80 MG/ML IJ SUSP
60.0000 mg | Freq: Once | INTRAMUSCULAR | Status: AC
  Administered 2014-07-02: 60 mg via INTRAMUSCULAR

## 2014-07-02 MED ORDER — SILDENAFIL CITRATE 100 MG PO TABS: 50.0000 mg | ORAL_TABLET | Freq: Every day | ORAL | Status: DC | PRN

## 2014-07-02 NOTE — Patient Instructions (Addendum)
Medicare Annual Wellness Visit  [facility] and the medical providers at [facility] strive to bring you the best medical care.  In doing so we not only want to address your current medical conditions and concerns but also to detect new conditions early and prevent illness, disease and health-related problems.    Medicare offers a yearly Wellness Visit which allows our clinical staff to assess your need for preventative services including immunizations, lifestyle education, counseling to decrease risk of preventable diseases and screening for fall risk and other medical concerns.    This visit is provided free of charge (no copay) for all Medicare recipients. The clinical pharmacists at [facility] have begun to conduct these Wellness Visits which will also include a thorough review of all your medications.    As you primary medical provider recommend that you make an appointment for your Annual Wellness Visit if you have not done so already this year.  You may set up this appointment before you leave today or you may call back [phone]) and schedule an appointment.  Please make sure when you call that you mention that you are scheduling your Annual Wellness Visit with the clinical pharmacist so that the appointment may be made for the proper length of time.     Continue current medications. Continue good therapeutic lifestyle changes which include good diet and exercise. Fall precautions discussed with patient. If an FOBT was given today- please return it to our front desk. If you are over 63 years old - you may need Prevnar 73 or the adult Pneumonia vaccine.  Flu Shots will be available at our office starting mid- September. Please call and schedule a FLU CLINIC APPOINTMENT.   Use inhalers as directed Albuterol one puff every 6 hours as needed as a rescue inhaler for wheezing and chest tightness Symbicort 160/4.5    2 puffs  every 12 hours, rinse mouth after using Purchase Flonase over-the-counter 1-2 puffs each nostril at bedtime. If he your allergy problem is seasonal always start this in early August and use until the first good frost or freeze Take prednisone as directed Debrox ear drops may be used to help soften ear wax. These can be purchased over-the-counter. 3-4 drops nightly to each ear for 3 nights in a row, wait one week and repeat Meclizine over-the-counter 12.5 mg 3 times daily with the for 7-10 days for dizziness of inner ear Continue with Mucinex for cough and congestion

## 2014-07-02 NOTE — Progress Notes (Signed)
Subjective:    Patient ID: Ricky [name], male    DOB: 27-Jul-1941, 73 y.o.   MRN: [id number]  HPI Pt here for follow up and management of chronic medical problems. The patient's only complaint today is dizziness and cough. The cough has been worse and the congestion has been worse since he helped his son put down a floor in an old home and he was exposed to a lot of dust and this was in early August. He indicates he has had the use inhalers in the past but never continues to use these. He has gotten inhalers but he does not remember the names of these. He also has a lot of nasal congestion. He also requests a refill on Viagra. The patient is seeing the urologist tomorrow. He is due to return in FOBT get a chest x-ray and get lab work. We will make sure that the urologists gets a copy of the lab work that we do.        Patient Active Problem List   Diagnosis Date Noted  . Vitamin D deficiency 03/12/2014  . BPH (benign prostatic hyperplasia) 10/01/2013  . Erectile dysfunction 10/01/2013  . PERSONAL HX COLONIC POLYPS 01/25/2010  . 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 HYPERTENSION, UNSPECIFIED 01/13/2009   Outpatient Encounter Prescriptions as of 07/02/2014  Medication Sig  . amLODipine (NORVASC) 10 MG tablet Take 1 tablet (10 mg total) by mouth daily.  . Cholecalciferol (VITAMIN D PO) Take by mouth.  . doxazosin (CARDURA) 2 MG tablet Take 1 tablet (2 mg total) by mouth at bedtime.  Marland Kitchen FIBER FORMULA PO Take 3 capsules by mouth 2 (two) times daily.  Marland Kitchen losartan (COZAAR) 100 MG tablet Take 1 tablet (100 mg total) by mouth daily.  . potassium chloride SA (K-DUR,KLOR-CON) 20 MEQ tablet Take 1 tablet (20 mEq total) by mouth 3 (three) times daily.  . tamsulosin (FLOMAX) 0.4 MG CAPS capsule Take 0.4 mg by mouth 2 (two) times daily.  . meloxicam (MOBIC) 7.5 MG tablet Take 7.5 mg by mouth daily.  . sildenafil (VIAGRA) 100 MG tablet Take 0.5-1 tablets (50-100 mg total) by mouth  daily as needed for erectile dysfunction.  . [DISCONTINUED] terbinafine (LAMISIL) 250 MG tablet Take 1 tablet (250 mg total) by mouth daily.    Review of Systems  Constitutional: Negative.   HENT: Negative.   Eyes: Negative.   Respiratory: Negative.   Cardiovascular: Negative.   Gastrointestinal: Negative.   Endocrine: Negative.   Genitourinary: Negative.   Musculoskeletal: Negative.   Skin: Negative.   Allergic/Immunologic: Negative.   Neurological: Positive for dizziness.  Hematological: Negative.   Psychiatric/Behavioral: Negative.        Objective:   Physical Exam  Nursing note and vitals reviewed. Constitutional: He is oriented to person, place, and time. He appears well-developed and well-nourished. No distress.  HENT:  Head: Normocephalic and atraumatic.  Mouth/Throat: Oropharynx is clear and moist. No oropharyngeal exudate.  Bilateral ear cerumen and bilateral nasal turbinate congestion  Eyes: Conjunctivae and EOM are normal. Pupils are equal, round, and reactive to light. Right eye exhibits no discharge. Left eye exhibits no discharge. No scleral icterus.  Neck: Normal range of motion. Neck supple. No thyromegaly present.  No carotid bruits and no cervical adenopathy  Cardiovascular: Normal rate, regular rhythm, normal heart sounds and intact distal pulses.  Exam reveals no gallop and no friction rub.   No murmur heard. At 72 per minute  Pulmonary/Chest: Effort normal. No respiratory  distress. He has wheezes. He has no rales. He exhibits no tenderness.  Tight congestive cough with minimal wheezing  Abdominal: Soft. Bowel sounds are normal. He exhibits no mass. There is no tenderness. There is no rebound and no guarding.  Genitourinary:  This patient sees Dr. Karsten Ro tomorrow for his regular prostate visit  Musculoskeletal: Normal range of motion. He exhibits no edema and no tenderness.  Lymphadenopathy:    He has no cervical adenopathy.  Neurological: He is alert  and oriented to person, place, and time. He has normal reflexes.  Skin: Skin is warm and dry. No rash noted. No erythema. No pallor.  Psychiatric: He has a normal mood and affect. His behavior is normal. Judgment and thought content normal.   BP 130/83  Pulse 60  Temp(Src) 97.8 F (36.6 C) (Oral)  Ht '5\' 7"'  (1.702 m)  Wt 203 lb (92.08 kg)  BMI 31.79 kg/m2  [facility] reading (PRIMARY) by  Dr. Brunilda Payor x-ray- borderline cardiac enlargement, no active disease degenerative disc changes                                 This patient was instructed in the use of his inhalers. Earwax was removed from both the ears with irrigation----the TMs visualized were normal  Results for orders placed in visit on 07/02/14  POCT CBC      Result Value Ref Range   WBC 4.7  4.6 - 10.2 K/uL   Lymph, poc 1.3  0.6 - 3.4   POC LYMPH PERCENT 28.4  10 - 50 %L   POC Granulocyte 3.1  2 - 6.9   Granulocyte percent 66.2  37 - 80 %G   RBC 4.9  4.69 - 6.13 M/uL   Hemoglobin 13.9 (*) 14.1 - 18.1 g/dL   HCT, POC 41.8 (*) 43.5 - 53.7 %   MCV 85.6  80 - 97 fL   MCH, POC 28.5  27 - 31.2 pg   MCHC 33.3  31.8 - 35.4 g/dL   RDW, POC 13.1     Platelet Count, POC 229.0  142 - 424 K/uL   MPV 7.0  0 - 99.8 fL         Assessment & Plan:  1. HYPERTENSION, UNSPECIFIED - POCT CBC - BMP8+EGFR - Hepatic function panel - Lipid panel - DG Chest 2 View; Future  2. BPH (benign prostatic hyperplasia) - POCT CBC -Followed by urologist  3. Vitamin D deficiency - POCT CBC - Vit D  25 hydroxy (rtn osteoporosis monitoring)  4. Cough - POCT CBC - DG Chest 2 View; Future  5. Acute bronchitis, unspecified organism  6. Bronchospasm with bronchitis, acute  7. Excessive cerumen in ear canal, bilateral  8. Labyrinthitis, bilateral  9. Allergic rhinitis due to pollen  Meds ordered this encounter  Medications  . sildenafil (VIAGRA) 100 MG tablet    Sig: Take 0.5-1 tablets (50-100 mg total) by mouth daily as needed for  erectile dysfunction.    Dispense:  12 tablet    Refill:  3  . albuterol (PROVENTIL HFA;VENTOLIN HFA) 108 (90 BASE) MCG/ACT inhaler    Sig: Inhale 1 puff into the lungs every 4 (four) hours as needed for wheezing or shortness of breath.    Dispense:  1 Inhaler    Refill:  11  . budesonide-formoterol (SYMBICORT) 160-4.5 MCG/ACT inhaler    Sig: Inhale 2 puffs into the lungs 2 (two) times daily.  Rinse mouth after uses    Dispense:  1 Inhaler    Refill:  3   Prednisone 10 taper #20 Depo-Medrol 60 mg IM   Patient Instructions                       Medicare Annual Wellness Visit  [facility] and the medical providers at [facility] strive to bring you the best medical care.  In doing so we not only want to address your current medical conditions and concerns but also to detect new conditions early and prevent illness, disease and health-related problems.    Medicare offers a yearly Wellness Visit which allows our clinical staff to assess your need for preventative services including immunizations, lifestyle education, counseling to decrease risk of preventable diseases and screening for fall risk and other medical concerns.    This visit is provided free of charge (no copay) for all Medicare recipients. The clinical pharmacists at [facility] have begun to conduct these Wellness Visits which will also include a thorough review of all your medications.    As you primary medical provider recommend that you make an appointment for your Annual Wellness Visit if you have not done so already this year.  You may set up this appointment before you leave today or you may call back ([phone]) and schedule an appointment.  Please make sure when you call that you mention that you are scheduling your Annual Wellness Visit with the clinical pharmacist so that the appointment may be made for the proper length of time.     Continue current medications. Continue  good therapeutic lifestyle changes which include good diet and exercise. Fall precautions discussed with patient. If an FOBT was given today- please return it to our front desk. If you are over 56 years old - you may need Prevnar 70 or the adult Pneumonia vaccine.  Flu Shots will be available at our office starting mid- September. Please call and schedule a FLU CLINIC APPOINTMENT.   Use inhalers as directed Albuterol one puff every 6 hours as needed as a rescue inhaler for wheezing and chest tightness Symbicort 160/4.5    2 puffs every 12 hours, rinse mouth after using Purchase Flonase over-the-counter 1-2 puffs each nostril at bedtime. If he your allergy problem is seasonal always start this in early August and use until the first good frost or freeze Take prednisone as directed Debrox ear drops may be used to help soften ear wax. These can be purchased over-the-counter. 3-4 drops nightly to each ear for 3 nights in a row, wait one week and repeat Meclizine over-the-counter 12.5 mg 3 times daily with the for 7-10 days for dizziness of inner ear Continue with Mucinex for cough and congestion   Arrie Senate MD

## 2014-07-03 LAB — LIPID PANEL
CHOL/HDL RATIO: 3.7 ratio (ref 0.0–5.0)
Cholesterol, Total: 172 mg/dL (ref 100–199)
HDL: 47 mg/dL (ref >39)
LDL Calculated: 107 mg/dL — ABNORMAL HIGH (ref 0–99)
Triglycerides: 90 mg/dL (ref 0–149)
VLDL Cholesterol Cal: 18 mg/dL (ref 5–40)

## 2014-07-03 LAB — BMP8+EGFR
BUN/Creatinine Ratio: 15 (ref 10–22)
BUN: 20 mg/dL (ref 8–27)
CO2: 23 mmol/L (ref 18–29)
Calcium: 9.9 mg/dL (ref 8.6–10.2)
Chloride: 101 mmol/L (ref 97–108)
Creatinine, Ser: 1.31 mg/dL — ABNORMAL HIGH (ref 0.76–1.27)
GFR calc Af Amer: 62 mL/min/{1.73_m2} (ref >59)
GFR calc non Af Amer: 54 mL/min/{1.73_m2} — ABNORMAL LOW (ref >59)
Glucose: 89 mg/dL (ref 65–99)
Potassium: 4 mmol/L (ref 3.5–5.2)
Sodium: 141 mmol/L (ref 134–144)

## 2014-07-03 LAB — HEPATIC FUNCTION PANEL
ALBUMIN: 4.4 g/dL (ref 3.5–4.8)
ALT: 24 IU/L (ref 0–44)
AST: 18 IU/L (ref 0–40)
Alkaline Phosphatase: 89 IU/L (ref 39–117)
BILIRUBIN DIRECT: 0.11 mg/dL (ref 0.00–0.40)
BILIRUBIN TOTAL: 0.4 mg/dL (ref 0.0–1.2)
Total Protein: 6.4 g/dL (ref 6.0–8.5)

## 2014-07-03 LAB — VITAMIN D 25 HYDROXY (VIT D DEFICIENCY, FRACTURES): VIT D 25 HYDROXY: 47.4 ng/mL (ref 30.0–100.0)

## 2014-07-06 ENCOUNTER — Telehealth: Payer: Self-pay | Admitting: Family Medicine

## 2014-07-06 NOTE — Telephone Encounter (Signed)
Message copied by Waverly Ferrari on Mon Jul 06, 2014  3:25 PM ------      Message from: Chipper Herb      Created: Fri Jul 03, 2014  3:29 PM       The blood sugar is good at 89. The creatinine the most important kidney function tests is slightly elevated at 1.31. Please make sure that the patient avoids taking any kind of anti-inflammatory medicines like ibuprofen or Aleve------ only Tylenol is okay to take. The patient should leave off meloxicam completely as this is an anti-inflammatory medication and it could cause the creatinine to go up higher .      All liver function tests are within normal limit      On a traditional lipid panel the LDL C. cholesterol is slightly elevated at 107. This number should be less than 100. The triglycerides are good at 90. The patient should continue with a weight loss regimen and better diet habits to get his cholesterol under better control      The vitamin D level is good and within normal limits. Continue current treatment      Please send a copy of this blood work to Dr. Karsten Ro , his urologist ------

## 2014-07-06 NOTE — Telephone Encounter (Signed)
Left message rx called in

## 2014-07-11 ENCOUNTER — Encounter: Payer: Self-pay | Admitting: *Deleted

## 2014-07-20 ENCOUNTER — Other Ambulatory Visit: Payer: Self-pay | Admitting: Family Medicine

## 2014-07-20 ENCOUNTER — Ambulatory Visit (INDEPENDENT_AMBULATORY_CARE_PROVIDER_SITE_OTHER): Payer: Medicare Other | Admitting: Nurse Practitioner

## 2014-07-20 ENCOUNTER — Other Ambulatory Visit: Payer: Medicare Other

## 2014-07-20 ENCOUNTER — Ambulatory Visit (INDEPENDENT_AMBULATORY_CARE_PROVIDER_SITE_OTHER): Payer: Medicare Other

## 2014-07-20 DIAGNOSIS — Z09 Encounter for follow-up examination after completed treatment for conditions other than malignant neoplasm: Secondary | ICD-10-CM

## 2014-07-20 DIAGNOSIS — Z23 Encounter for immunization: Secondary | ICD-10-CM

## 2014-07-20 DIAGNOSIS — J90 Pleural effusion, not elsewhere classified: Secondary | ICD-10-CM

## 2014-10-09 HISTORY — PX: CATARACT EXTRACTION W/ INTRAOCULAR LENS  IMPLANT, BILATERAL: SHX1307

## 2014-10-29 ENCOUNTER — Other Ambulatory Visit: Payer: Self-pay | Admitting: Family Medicine

## 2015-02-22 ENCOUNTER — Other Ambulatory Visit: Payer: Self-pay | Admitting: Family Medicine

## 2015-02-26 ENCOUNTER — Other Ambulatory Visit: Payer: Self-pay | Admitting: *Deleted

## 2015-02-26 MED ORDER — DOXAZOSIN MESYLATE 2 MG PO TABS: ORAL_TABLET | ORAL | Status: DC

## 2015-03-01 ENCOUNTER — Telehealth: Payer: Self-pay | Admitting: Family Medicine

## 2015-03-01 NOTE — Telephone Encounter (Signed)
CAlled Prim[facility], held for 14 minutes, called back held for 16 min and got disconnected. In meantime, pt called me very upset. Called primemail again, held 21 minutes total. I did directly speak to pharmacist and gave doxazosin order.

## 2015-03-01 NOTE — Telephone Encounter (Signed)
Done 02/26/15, pt aware

## 2015-03-22 ENCOUNTER — Other Ambulatory Visit: Payer: Self-pay | Admitting: Family Medicine

## 2015-03-23 NOTE — Telephone Encounter (Signed)
Last seen 07/02/14 DW[facility]  Patient requesting 90 day supply

## 2015-04-13 ENCOUNTER — Telehealth: Payer: Self-pay | Admitting: Family Medicine

## 2015-04-13 ENCOUNTER — Other Ambulatory Visit: Payer: Self-pay | Admitting: Family Medicine

## 2015-04-13 MED ORDER — LOSARTAN POTASSIUM 100 MG PO TABS: 100.0000 mg | ORAL_TABLET | Freq: Every day | ORAL | Status: DC

## 2015-04-13 NOTE — Telephone Encounter (Signed)
Needs Losartan refilled. It was denied because by our prescription protocol patient need to be seen in order for the nurse to refill it. Patient normally sees Dr Laurance Flatten once a year. Will be due for office visit in 06/2015. Losartan refilled for 90 day supply with no refill. Cosign required by Dr Laurance Flatten. Patient aware.

## 2015-05-03 ENCOUNTER — Other Ambulatory Visit: Payer: Self-pay | Admitting: Family Medicine

## 2015-05-17 ENCOUNTER — Ambulatory Visit (INDEPENDENT_AMBULATORY_CARE_PROVIDER_SITE_OTHER): Payer: Medicare Other | Admitting: Family Medicine

## 2015-05-17 ENCOUNTER — Encounter: Payer: Self-pay | Admitting: Family Medicine

## 2015-05-17 ENCOUNTER — Other Ambulatory Visit: Payer: Self-pay | Admitting: Family Medicine

## 2015-05-17 VITALS — BP 144/81 | HR 58 | Temp 97.0°F | Ht 67.0 in | Wt 204.0 lb

## 2015-05-17 DIAGNOSIS — N400 Enlarged prostate without lower urinary tract symptoms: Secondary | ICD-10-CM | POA: Diagnosis not present

## 2015-05-17 DIAGNOSIS — M25569 Pain in unspecified knee: Secondary | ICD-10-CM

## 2015-05-17 DIAGNOSIS — I10 Essential (primary) hypertension: Secondary | ICD-10-CM

## 2015-05-17 DIAGNOSIS — E559 Vitamin D deficiency, unspecified: Secondary | ICD-10-CM | POA: Diagnosis not present

## 2015-05-17 DIAGNOSIS — M25559 Pain in unspecified hip: Secondary | ICD-10-CM

## 2015-05-17 DIAGNOSIS — J301 Allergic rhinitis due to pollen: Secondary | ICD-10-CM

## 2015-05-17 MED ORDER — LOSARTAN POTASSIUM 100 MG PO TABS: 100.0000 mg | ORAL_TABLET | Freq: Every day | ORAL | Status: DC

## 2015-05-17 MED ORDER — AMLODIPINE BESYLATE 10 MG PO TABS: ORAL_TABLET | ORAL | Status: DC

## 2015-05-17 MED ORDER — DOXAZOSIN MESYLATE 2 MG PO TABS: 2.0000 mg | ORAL_TABLET | Freq: Every day | ORAL | Status: DC

## 2015-05-17 MED ORDER — TAMSULOSIN HCL 0.4 MG PO CAPS: 0.4000 mg | ORAL_CAPSULE | Freq: Two times a day (BID) | ORAL | Status: DC

## 2015-05-17 MED ORDER — POTASSIUM CHLORIDE CRYS ER 20 MEQ PO TBCR: EXTENDED_RELEASE_TABLET | ORAL | Status: DC

## 2015-05-17 NOTE — Patient Instructions (Addendum)
Medicare Annual Wellness Visit  [facility] and the medical providers at [facility] strive to bring you the best medical care.  In doing so we not only want to address your current medical conditions and concerns but also to detect new conditions early and prevent illness, disease and health-related problems.    Medicare offers a yearly Wellness Visit which allows our clinical staff to assess your need for preventative services including immunizations, lifestyle education, counseling to decrease risk of preventable diseases and screening for fall risk and other medical concerns.    This visit is provided free of charge (no copay) for all Medicare recipients. The clinical pharmacists at [facility] have begun to conduct these Wellness Visits which will also include a thorough review of all your medications.    As you primary medical provider recommend that you make an appointment for your Annual Wellness Visit if you have not done so already this year.  You may set up this appointment before you leave today or you may call back ([phone]) and schedule an appointment.  Please make sure when you call that you mention that you are scheduling your Annual Wellness Visit with the clinical pharmacist so that the appointment may be made for the proper length of time.     Continue current medications. Continue good therapeutic lifestyle changes which include good diet and exercise. Fall precautions discussed with patient. If an FOBT was given today- please return it to our front desk. If you are over 42 years old - you may need Prevnar 24 or the adult Pneumonia vaccine.   After your visit with [place] today you will receive a survey in the mail or online from [employer] regarding your care with [place]. Please take a moment to fill this out. Your feedback is very important to [place] as you can help [place] better understand your patient needs as well as  improve your experience and satisfaction. WE CARE ABOUT YOU!!!   Please keep follow-up appointment with the urologist Continue to take blood pressure medicines regularly and potassium regularly and take Tylenol if needed for shoulder pain This summer drink plenty of fluids Flonase over-the-counter 1 spray each nostril at bedtime can help allergic rhinitis

## 2015-05-17 NOTE — Progress Notes (Signed)
Subjective:    Patient ID: Ricky [name], male    DOB: May 01, 1941, 74 y.o.   MRN: [id number]  HPI Pt here for follow up and management of chronic medical problems which includes hypertension. He is taking medications regularly. The patient has a history of bilateral knee replacement. He recently had a tick bite and took 21 days worth of doxycycline for this. We will refill his medications. The patient denies chest pain or shortness of breath. He has no trouble with his GI tract. He still has problems with voiding and incontinence. He sees the urologist about this. He will see the urologist in September. He also complains of arthralgias especially in his shoulders and other joints. He does have a contact [facility] if the shoulder problems continue.    Patient Active Problem List   Diagnosis Date Noted  . Vitamin D deficiency 03/12/2014  . BPH (benign prostatic hyperplasia) 10/01/2013  . Erectile dysfunction 10/01/2013  . PERSONAL HX COLONIC POLYPS 01/25/2010  . 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 HYPERTENSION, UNSPECIFIED 01/13/2009   Outpatient Encounter Prescriptions as of 05/17/2015  Medication Sig  . amLODipine (NORVASC) 10 MG tablet TAKE 1 BY MOUTH DAILY  . Cholecalciferol (VITAMIN D PO) Take by mouth.  . doxazosin (CARDURA) 2 MG tablet Take 1 tablet (2 mg total) by mouth daily.  Marland Kitchen FIBER FORMULA PO Take 3 capsules by mouth 2 (two) times daily.  Marland Kitchen losartan (COZAAR) 100 MG tablet Take 1 tablet (100 mg total) by mouth daily.  . potassium chloride SA (K-DUR,KLOR-CON) 20 MEQ tablet TAKE 1 BY MOUTH 3 TIMES DAILY  . sildenafil (VIAGRA) 100 MG tablet Take 0.5-1 tablets (50-100 mg total) by mouth daily as needed for erectile dysfunction.  . tamsulosin (FLOMAX) 0.4 MG CAPS capsule Take 0.4 mg by mouth 2 (two) times daily.  . budesonide-formoterol (SYMBICORT) 160-4.5 MCG/ACT inhaler Inhale 2 puffs into the lungs 2 (two) times daily. Rinse mouth after uses  (Patient not taking: Reported on 05/17/2015)  . meloxicam (MOBIC) 7.5 MG tablet Take 7.5 mg by mouth daily.  . [DISCONTINUED] albuterol (PROVENTIL HFA;VENTOLIN HFA) 108 (90 BASE) MCG/ACT inhaler Inhale 1 puff into the lungs every 4 (four) hours as needed for wheezing or shortness of breath. (Patient not taking: Reported on 05/17/2015)   No facility-administered encounter medications on file as of 05/17/2015.      Review of Systems  Constitutional: Negative.   HENT: Negative.   Eyes: Negative.   Respiratory: Negative.   Cardiovascular: Negative.   Gastrointestinal: Negative.   Endocrine: Negative.   Genitourinary: Negative.   Musculoskeletal: Negative.   Skin: Negative.   Allergic/Immunologic: Negative.   Neurological: Negative.   Hematological: Negative.   Psychiatric/Behavioral: Negative.        Objective:   Physical Exam  Constitutional: He is oriented to person, place, and time. He appears well-developed and well-nourished. No distress.  HENT:  Head: Normocephalic and atraumatic.  Right Ear: External ear normal.  Left Ear: External ear normal.  Mouth/Throat: Oropharynx is clear and moist. No oropharyngeal exudate.  Nasal congestion and turbinate swelling bilaterally  Eyes: Conjunctivae and EOM are normal. Pupils are equal, round, and reactive to light. Right eye exhibits no discharge. Left eye exhibits no discharge. No scleral icterus.  Neck: Normal range of motion. Neck supple. No tracheal deviation present. No thyromegaly present.  Without bruits or thyromegaly  Cardiovascular: Normal rate, regular rhythm, normal heart sounds and intact distal pulses.  Exam reveals no gallop and no  friction rub.   No murmur heard. At 72/m  Pulmonary/Chest: Effort normal and breath sounds normal. No respiratory distress. He has no wheezes. He has no rales. He exhibits no tenderness.  Clear anteriorly and posteriorly  Abdominal: Soft. Bowel sounds are normal. He exhibits no mass. There is no  tenderness. There is no rebound and no guarding.  Genitourinary:  The patient has an appointment with Dr. Karsten Ro in September  Musculoskeletal: Normal range of motion. He exhibits no edema or tenderness.  History of bilateral knee replacement  Lymphadenopathy:    He has no cervical adenopathy.  Neurological: He is alert and oriented to person, place, and time. He has normal reflexes. No cranial nerve deficit.  Skin: Skin is warm and dry. No rash noted. No erythema. No pallor.  Recent rash has resolved from what was presumed to be a deer tick. The patient has completed a course of doxycycline.  Psychiatric: He has a normal mood and affect. His behavior is normal. Judgment and thought content normal.  Nursing note and vitals reviewed.  BP 144/81 mmHg  Pulse 58  Temp(Src) 97 F (36.1 C) (Oral)  Ht '5\' 7"'  (1.702 m)  Wt 204 lb (92.534 kg)  BMI 31.94 kg/m2        Assessment & Plan:  1. BPH (benign prostatic hyperplasia) -Continue follow-up with urology - CBC with Differential/Platelet - PSA  2. Vitamin D deficiency -Continue vitamin D replacement pending results of lab work - Vit D  25 hydroxy (rtn osteoporosis monitoring) - CBC with Differential/Platelet  3. Essential hypertension -The blood pressure for this patient is slightly elevated today and he says that his home readings are similar. This is good for this patient and we will make no change in his medication. - BMP8+EGFR - Hepatic function panel - Lipid panel - CBC with Differential/Platelet  4. Chronic arthralgias of knees and hips, unspecified laterality -Follow up with [facility] especially regarding his shoulders.  5. Allergic rhinitis due to pollen -Use Flonase over-the-counter 1 spray each nostril at bedtime  Meds ordered this encounter  Medications  . tamsulosin (FLOMAX) 0.4 MG CAPS capsule    Sig: Take 1 capsule (0.4 mg total) by mouth 2 (two) times daily.    Dispense:  90 capsule    Refill:   3  . potassium chloride SA (K-DUR,KLOR-CON) 20 MEQ tablet    Sig: TAKE 1 BY MOUTH 3 TIMES DAILY    Dispense:  270 tablet    Refill:  3  . doxazosin (CARDURA) 2 MG tablet    Sig: Take 1 tablet (2 mg total) by mouth daily.    Dispense:  90 tablet    Refill:  3  . amLODipine (NORVASC) 10 MG tablet    Sig: TAKE 1 BY MOUTH DAILY    Dispense:  90 tablet    Refill:  3  . losartan (COZAAR) 100 MG tablet    Sig: Take 1 tablet (100 mg total) by mouth daily.    Dispense:  90 tablet    Refill:  3   Patient Instructions                       Medicare Annual Wellness Visit  [facility] and the medical providers at [facility] strive to bring you the best medical care.  In doing so we not only want to address your current medical conditions and concerns but also to detect new conditions early and prevent illness, disease  and health-related problems.    Medicare offers a yearly Wellness Visit which allows our clinical staff to assess your need for preventative services including immunizations, lifestyle education, counseling to decrease risk of preventable diseases and screening for fall risk and other medical concerns.    This visit is provided free of charge (no copay) for all Medicare recipients. The clinical pharmacists at [facility] have begun to conduct these Wellness Visits which will also include a thorough review of all your medications.    As you primary medical provider recommend that you make an appointment for your Annual Wellness Visit if you have not done so already this year.  You may set up this appointment before you leave today or you may call back ([phone]) and schedule an appointment.  Please make sure when you call that you mention that you are scheduling your Annual Wellness Visit with the clinical pharmacist so that the appointment may be made for the proper length of time.     Continue current medications. Continue good  therapeutic lifestyle changes which include good diet and exercise. Fall precautions discussed with patient. If an FOBT was given today- please return it to our front desk. If you are over 74 years old - you may need Prevnar 58 or the adult Pneumonia vaccine.   After your visit with [place] today you will receive a survey in the mail or online from [employer] regarding your care with [place]. Please take a moment to fill this out. Your feedback is very important to [place] as you can help [place] better understand your patient needs as well as improve your experience and satisfaction. WE CARE ABOUT YOU!!!   Please keep follow-up appointment with the urologist Continue to take blood pressure medicines regularly and potassium regularly and take Tylenol if needed for shoulder pain This summer drink plenty of fluids Flonase over-the-counter 1 spray each nostril at bedtime can help allergic rhinitis   Arrie Senate MD

## 2015-05-18 LAB — LIPID PANEL
CHOLESTEROL TOTAL: 174 mg/dL (ref 100–199)
Chol/HDL Ratio: 3.3 ratio units (ref 0.0–5.0)
HDL: 53 mg/dL (ref >39)
LDL Calculated: 88 mg/dL (ref 0–99)
Triglycerides: 166 mg/dL — ABNORMAL HIGH (ref 0–149)
VLDL Cholesterol Cal: 33 mg/dL (ref 5–40)

## 2015-05-18 LAB — BMP8+EGFR
BUN/Creatinine Ratio: 16 (ref 10–22)
BUN: 15 mg/dL (ref 8–27)
CALCIUM: 9.8 mg/dL (ref 8.6–10.2)
CO2: 27 mmol/L (ref 18–29)
CREATININE: 0.94 mg/dL (ref 0.76–1.27)
Chloride: 101 mmol/L (ref 97–108)
GFR calc Af Amer: 93 mL/min/{1.73_m2} (ref >59)
GFR, EST NON AFRICAN AMERICAN: 80 mL/min/{1.73_m2} (ref >59)
Glucose: 91 mg/dL (ref 65–99)
POTASSIUM: 4 mmol/L (ref 3.5–5.2)
Sodium: 143 mmol/L (ref 134–144)

## 2015-05-18 LAB — CBC WITH DIFFERENTIAL/PLATELET
BASOS: 0 %
Basophils Absolute: 0 10*3/uL (ref 0.0–0.2)
EOS (ABSOLUTE): 0.1 10*3/uL (ref 0.0–0.4)
Eos: 2 %
Hematocrit: 41.8 % (ref 37.5–51.0)
Hemoglobin: 14.3 g/dL (ref 12.6–17.7)
Immature Grans (Abs): 0 10*3/uL (ref 0.0–0.1)
Immature Granulocytes: 0 %
LYMPHS ABS: 1.3 10*3/uL (ref 0.7–3.1)
Lymphs: 33 %
MCH: 28.5 pg (ref 26.6–33.0)
MCHC: 34.2 g/dL (ref 31.5–35.7)
MCV: 83 fL (ref 79–97)
Monocytes Absolute: 0.5 10*3/uL (ref 0.1–0.9)
Monocytes: 13 %
Neutrophils Absolute: 2 10*3/uL (ref 1.4–7.0)
Neutrophils: 52 %
Platelets: 173 10*3/uL (ref 150–379)
RBC: 5.01 x10E6/uL (ref 4.14–5.80)
RDW: 14.8 % (ref 12.3–15.4)
WBC: 3.9 10*3/uL (ref 3.4–10.8)

## 2015-05-18 LAB — HEPATIC FUNCTION PANEL
ALBUMIN: 4.4 g/dL (ref 3.5–4.8)
ALT: 23 IU/L (ref 0–44)
AST: 15 IU/L (ref 0–40)
Alkaline Phosphatase: 82 IU/L (ref 39–117)
BILIRUBIN TOTAL: 0.6 mg/dL (ref 0.0–1.2)
BILIRUBIN, DIRECT: 0.18 mg/dL (ref 0.00–0.40)
TOTAL PROTEIN: 6.2 g/dL (ref 6.0–8.5)

## 2015-05-18 LAB — VITAMIN D 25 HYDROXY (VIT D DEFICIENCY, FRACTURES): Vit D, 25-Hydroxy: 48.6 ng/mL (ref 30.0–100.0)

## 2015-05-18 LAB — PSA: Prostate Specific Ag, Serum: 7.1 ng/mL — ABNORMAL HIGH (ref 0.0–4.0)

## 2015-05-19 ENCOUNTER — Telehealth: Payer: Self-pay | Admitting: Family Medicine

## 2015-05-19 ENCOUNTER — Ambulatory Visit: Payer: Medicare Other | Admitting: Family Medicine

## 2015-05-19 NOTE — Telephone Encounter (Signed)
Lm 8/10-jhb

## 2015-05-25 ENCOUNTER — Encounter: Payer: Self-pay | Admitting: Podiatry

## 2015-05-25 ENCOUNTER — Ambulatory Visit (INDEPENDENT_AMBULATORY_CARE_PROVIDER_SITE_OTHER): Payer: Medicare Other | Admitting: Podiatry

## 2015-05-25 DIAGNOSIS — M79676 Pain in unspecified toe(s): Secondary | ICD-10-CM | POA: Diagnosis not present

## 2015-05-25 DIAGNOSIS — B351 Tinea unguium: Secondary | ICD-10-CM | POA: Diagnosis not present

## 2015-05-25 NOTE — Progress Notes (Signed)
Patient ID: Ricky [name], male   DOB: 01-Aug-1941, 74 y.o.   MRN: [id number]  Subjective: This patient presents today complaining of painful toenails aggravated with shoe wearing walking and that she said night and requesting nail debridement  Objective: Orientated 3 The toenails are elongated, discolored, incurvated and tender to direct palpation 6-10  Assessment: Symptomatic onychomycoses 6-10  Plan: Debridement of toenails 10 and mechanically and electrically without any  Bleeding  Reappoint at patient's request

## 2015-05-25 NOTE — Telephone Encounter (Signed)
Stp and he said his back is ok now.

## 2015-06-15 ENCOUNTER — Other Ambulatory Visit: Payer: Self-pay | Admitting: Family Medicine

## 2015-09-21 ENCOUNTER — Encounter (INDEPENDENT_AMBULATORY_CARE_PROVIDER_SITE_OTHER): Payer: Self-pay

## 2015-09-21 ENCOUNTER — Ambulatory Visit (INDEPENDENT_AMBULATORY_CARE_PROVIDER_SITE_OTHER): Payer: Medicare Other | Admitting: Family Medicine

## 2015-09-21 ENCOUNTER — Encounter: Payer: Self-pay | Admitting: Family Medicine

## 2015-09-21 VITALS — BP 165/80 | HR 66 | Temp 97.0°F | Ht 67.0 in | Wt 214.0 lb

## 2015-09-21 DIAGNOSIS — N400 Enlarged prostate without lower urinary tract symptoms: Secondary | ICD-10-CM

## 2015-09-21 DIAGNOSIS — E559 Vitamin D deficiency, unspecified: Secondary | ICD-10-CM | POA: Diagnosis not present

## 2015-09-21 DIAGNOSIS — J40 Bronchitis, not specified as acute or chronic: Secondary | ICD-10-CM | POA: Diagnosis not present

## 2015-09-21 DIAGNOSIS — I10 Essential (primary) hypertension: Secondary | ICD-10-CM

## 2015-09-21 DIAGNOSIS — J209 Acute bronchitis, unspecified: Secondary | ICD-10-CM

## 2015-09-21 MED ORDER — AZITHROMYCIN 250 MG PO TABS: ORAL_TABLET | ORAL | Status: DC

## 2015-09-21 NOTE — Patient Instructions (Addendum)
Medicare Annual Wellness Visit  [facility] and the medical providers at [facility] strive to bring you the best medical care.  In doing so we not only want to address your current medical conditions and concerns but also to detect new conditions early and prevent illness, disease and health-related problems.    Medicare offers a yearly Wellness Visit which allows our clinical staff to assess your need for preventative services including immunizations, lifestyle education, counseling to decrease risk of preventable diseases and screening for fall risk and other medical concerns.    This visit is provided free of charge (no copay) for all Medicare recipients. The clinical pharmacists at [facility] have begun to conduct these Wellness Visits which will also include a thorough review of all your medications.    As you primary medical provider recommend that you make an appointment for your Annual Wellness Visit if you have not done so already this year.  You may set up this appointment before you leave today or you may call back [phone] and schedule an appointment.  Please make sure when you call that you mention that you are scheduling your Annual Wellness Visit with the clinical pharmacist so that the appointment may be made for the proper length of time.     Continue current medications. Continue good therapeutic lifestyle changes which include good diet and exercise. Fall precautions discussed with patient. If an FOBT was given today- please return it to our front desk. If you are over 25 years old - you may need Prevnar 72 or the adult Pneumonia vaccine.  **Flu shots are available--- please call and schedule a FLU-CLINIC appointment**  After your visit with [place] today you will receive a survey in the mail or online from [employer] regarding your care with [place]. Please take a moment to fill this out. Your feedback is very  important to [place] as you can help [place] better understand your patient needs as well as improve your experience and satisfaction. WE CARE ABOUT YOU!!!   The patient should stay well hydrated and drink plenty of fluids He should take Tylenol for aches pains and fever He should work aggressively on losing weight and watching his sodium intake He should return to the clinic in 2 weeks for a recheck of his blood pressure and to get his Prevnar vaccine and flu shot Discontinue the Mucinex DM and take plain Mucinex, maximum strength, blue and white in color, 1 twice daily with a large glass of water for cough and congestion Continue to use the Symbicort 2 puffs twice daily and rinse mouth after using with water

## 2015-09-21 NOTE — Progress Notes (Signed)
Subjective:    Patient ID: Ricky [name], male    DOB: Jul 13, 1941, 74 y.o.   MRN: [id number]  HPI Pt here for follow up and management of chronic medical problems which includes hypertension. He is taking medications regularly. The patient has some cough and congestion for about a week. He is also complaining of some back pain and neck stiffness and has a lesion that he wants [place] to look at on his thigh. He will be given an FOBT to return. The weight is up 10 pounds from a previous visit. The patient indicates that his wife is a good cook and he knows that is no excuse for the weight gain. He denies chest pain or shortness of breath other than the cold and cough that he has now he has no trouble swallowing, no heartburn or indigestion no nausea or vomiting diarrhea or blood in the stool. He is passing his water without problems he sees the urologist annually in the fall. His father did have prostate cancer. With the cough and congestion he has not been running any fever and the cough is a dry cough.      Patient Active Problem List   Diagnosis Date Noted  . Vitamin D deficiency 03/12/2014  . BPH (benign prostatic hyperplasia) 10/01/2013  . Erectile dysfunction 10/01/2013  . PERSONAL HX COLONIC POLYPS 01/25/2010  . 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 HYPERTENSION, UNSPECIFIED 01/13/2009   Outpatient Encounter Prescriptions as of 09/21/2015  Medication Sig  . amLODipine (NORVASC) 10 MG tablet TAKE 1 BY MOUTH DAILY  . budesonide-formoterol (SYMBICORT) 160-4.5 MCG/ACT inhaler Inhale 2 puffs into the lungs 2 (two) times daily. Rinse mouth after uses  . Cholecalciferol (VITAMIN D PO) Take by mouth.  . doxazosin (CARDURA) 2 MG tablet Take 1 tablet (2 mg total) by mouth daily.  Marland Kitchen FIBER FORMULA PO Take 3 capsules by mouth 2 (two) times daily.  Marland Kitchen losartan (COZAAR) 100 MG tablet TAKE 1 BY MOUTH DAILY  . potassium chloride SA (K-DUR,KLOR-CON) 20 MEQ tablet TAKE 1 BY MOUTH 3 TIMES  DAILY  . sildenafil (VIAGRA) 100 MG tablet Take 0.5-1 tablets (50-100 mg total) by mouth daily as needed for erectile dysfunction.  . tamsulosin (FLOMAX) 0.4 MG CAPS capsule Take 1 capsule (0.4 mg total) by mouth 2 (two) times daily.  . [DISCONTINUED] meloxicam (MOBIC) 7.5 MG tablet Take 7.5 mg by mouth daily.   No facility-administered encounter medications on file as of 09/21/2015.     Review of Systems  Constitutional: Negative.   HENT: Positive for congestion.   Eyes: Negative.   Respiratory: Positive for cough (x  1 week ).   Cardiovascular: Negative.   Gastrointestinal: Negative.   Endocrine: Negative.   Genitourinary: Negative.   Musculoskeletal: Positive for back pain and neck stiffness.  Skin: Negative.        Lesion on left thigh  Allergic/Immunologic: Negative.   Neurological: Negative.   Hematological: Negative.   Psychiatric/Behavioral: Negative.        Objective:   Physical Exam  Constitutional: He is oriented to person, place, and time. He appears well-developed and well-nourished. No distress.  HENT:  Head: Normocephalic and atraumatic.  Right Ear: External ear normal.  Left Ear: External ear normal.  Mouth/Throat: Oropharynx is clear and moist. No oropharyngeal exudate.  Nasal congestion and turbinate swelling bilaterally  Eyes: Conjunctivae and EOM are normal. Pupils are equal, round, and reactive to light. Right eye exhibits no discharge. Left eye exhibits no  discharge. No scleral icterus.  Neck: Normal range of motion. Neck supple. No thyromegaly present.  No thyromegaly or anterior cervical adenopathy  Cardiovascular: Normal rate, regular rhythm, normal heart sounds and intact distal pulses.   No murmur heard. Heart is regular at 72/m  Pulmonary/Chest: Effort normal and breath sounds normal. No respiratory distress. He has no wheezes. He has no rales. He exhibits no tenderness.  Dry cough  Abdominal: Soft. Bowel sounds are normal. He exhibits no mass.  There is no tenderness. There is no rebound and no guarding.  Abdominal obesity without tenderness masses or organ enlargement and no inguinal adenopathy  Genitourinary:  The patient sees Dr. Karsten Ro yearly in the fall  Musculoskeletal: Normal range of motion. He exhibits no edema.  Lymphadenopathy:    He has no cervical adenopathy.  Neurological: He is alert and oriented to person, place, and time. He has normal reflexes. No cranial nerve deficit.  Skin: Skin is warm and dry. No rash noted.  Psychiatric: He has a normal mood and affect. His behavior is normal. Judgment and thought content normal.  Nursing note and vitals reviewed.  BP 161/94 mmHg  Pulse 66  Temp(Src) 97 F (36.1 C) (Oral)  Ht '5\' 7"'  (1.702 m)  Wt 214 lb (97.07 kg)  BMI 33.51 kg/m2        Assessment & Plan:  1. BPH (benign prostatic hyperplasia) -Continue to follow up with the urologist - CBC with Differential/Platelet; Future  2. Vitamin D deficiency -Any current treatment pending results of lab work - CBC with Differential/Platelet; Future - VITAMIN D 25 Hydroxy (Vit-D Deficiency, Fractures); Future  3. Essential hypertension -Continue current blood pressure medication. Reduce salt intake. Lose weight. Return to clinic in 10-14 days and get a flu shot and Prevnar vaccine and recheck blood pressure - BMP8+EGFR; Future - CBC with Differential/Platelet; Future - Hepatic function panel; Future - NMR, lipoprofile; Future  4. Bronchitis with bronchospasm -Take antibiotic as directed and take Mucinex as directed and drink plenty of fluids - azithromycin (ZITHROMAX) 250 MG tablet; 2 pills the first day then one daily for infection until completed  Dispense: 6 tablet; Refill: 0  Patient Instructions                       Medicare Annual Wellness Visit  [facility] and the medical providers at [facility] strive to bring you the best medical care.  In doing so we not only want to address  your current medical conditions and concerns but also to detect new conditions early and prevent illness, disease and health-related problems.    Medicare offers a yearly Wellness Visit which allows our clinical staff to assess your need for preventative services including immunizations, lifestyle education, counseling to decrease risk of preventable diseases and screening for fall risk and other medical concerns.    This visit is provided free of charge (no copay) for all Medicare recipients. The clinical pharmacists at [facility] have begun to conduct these Wellness Visits which will also include a thorough review of all your medications.    As you primary medical provider recommend that you make an appointment for your Annual Wellness Visit if you have not done so already this year.  You may set up this appointment before you leave today or you may call back [phone] and schedule an appointment.  Please make sure when you call that you mention that you are scheduling your Annual Wellness Visit  with the clinical pharmacist so that the appointment may be made for the proper length of time.     Continue current medications. Continue good therapeutic lifestyle changes which include good diet and exercise. Fall precautions discussed with patient. If an FOBT was given today- please return it to our front desk. If you are over 53 years old - you may need Prevnar 15 or the adult Pneumonia vaccine.  **Flu shots are available--- please call and schedule a FLU-CLINIC appointment**  After your visit with [place] today you will receive a survey in the mail or online from [employer] regarding your care with [place]. Please take a moment to fill this out. Your feedback is very important to [place] as you can help [place] better understand your patient needs as well as improve your experience and satisfaction. WE CARE ABOUT YOU!!!   The patient should stay well hydrated and drink plenty of fluids He  should take Tylenol for aches pains and fever He should work aggressively on losing weight and watching his sodium intake He should return to the clinic in 2 weeks for a recheck of his blood pressure and to get his Prevnar vaccine and flu shot Discontinue the Mucinex DM and take plain Mucinex, maximum strength, blue and white in color, 1 twice daily with a large glass of water for cough and congestion Continue to use the Symbicort 2 puffs twice daily and rinse mouth after using with water   Arrie Senate MD

## 2015-09-23 MED ORDER — AZITHROMYCIN 250 MG PO TABS: ORAL_TABLET | ORAL | Status: DC

## 2015-09-23 NOTE — Addendum Note (Signed)
Addended by: Zannie Cove on: 09/23/2015 10:27 AM   Modules accepted: Orders

## 2015-09-28 ENCOUNTER — Telehealth: Payer: Self-pay | Admitting: Family Medicine

## 2015-09-28 NOTE — Telephone Encounter (Signed)
Patient spoke with Roselyn Reef

## 2015-09-29 ENCOUNTER — Ambulatory Visit (INDEPENDENT_AMBULATORY_CARE_PROVIDER_SITE_OTHER): Payer: Medicare Other | Admitting: Family Medicine

## 2015-09-29 ENCOUNTER — Encounter: Payer: Self-pay | Admitting: Family Medicine

## 2015-09-29 VITALS — BP 131/80 | HR 63 | Temp 97.3°F | Ht 67.0 in | Wt 208.0 lb

## 2015-09-29 DIAGNOSIS — J40 Bronchitis, not specified as acute or chronic: Secondary | ICD-10-CM

## 2015-09-29 DIAGNOSIS — E559 Vitamin D deficiency, unspecified: Secondary | ICD-10-CM | POA: Diagnosis not present

## 2015-09-29 DIAGNOSIS — I10 Essential (primary) hypertension: Secondary | ICD-10-CM | POA: Diagnosis not present

## 2015-09-29 DIAGNOSIS — N400 Enlarged prostate without lower urinary tract symptoms: Secondary | ICD-10-CM

## 2015-09-29 DIAGNOSIS — J209 Acute bronchitis, unspecified: Secondary | ICD-10-CM

## 2015-09-29 DIAGNOSIS — J302 Other seasonal allergic rhinitis: Secondary | ICD-10-CM

## 2015-09-29 MED ORDER — METHYLPREDNISOLONE ACETATE 80 MG/ML IJ SUSP
60.0000 mg | Freq: Once | INTRAMUSCULAR | Status: AC
  Administered 2015-09-29: 60 mg via INTRAMUSCULAR

## 2015-09-29 MED ORDER — PREDNISONE 10 MG PO TABS: ORAL_TABLET | ORAL | Status: DC

## 2015-09-29 MED ORDER — FLUTICASONE PROPIONATE 50 MCG/ACT NA SUSP: NASAL | Status: DC

## 2015-09-29 NOTE — Progress Notes (Signed)
Subjective:    Patient ID: Ricky [name], male    DOB: 1940-11-15, 74 y.o.   MRN: [id number]  HPI Patient here today for 10 day follow up on cough. Patient states that he feels about the same. The patient still has postnasal drip and cough. Is not a lot better since he took the Z-Pak and he has stopped the Mucinex. He continues to have a cough. He does have nasal drainage.      Patient Active Problem List   Diagnosis Date Noted  . Vitamin D deficiency 03/12/2014  . BPH (benign prostatic hyperplasia) 10/01/2013  . Erectile dysfunction 10/01/2013  . PERSONAL HX COLONIC POLYPS 01/25/2010  . 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 HYPERTENSION, UNSPECIFIED 01/13/2009   Outpatient Encounter Prescriptions as of 09/29/2015  Medication Sig  . amLODipine (NORVASC) 10 MG tablet TAKE 1 BY MOUTH DAILY  . budesonide-formoterol (SYMBICORT) 160-4.5 MCG/ACT inhaler Inhale 2 puffs into the lungs 2 (two) times daily. Rinse mouth after uses  . Cholecalciferol (VITAMIN D PO) Take by mouth.  . doxazosin (CARDURA) 2 MG tablet Take 1 tablet (2 mg total) by mouth daily.  Marland Kitchen FIBER FORMULA PO Take 3 capsules by mouth 2 (two) times daily.  Marland Kitchen losartan (COZAAR) 100 MG tablet TAKE 1 BY MOUTH DAILY  . potassium chloride SA (K-DUR,KLOR-CON) 20 MEQ tablet TAKE 1 BY MOUTH 3 TIMES DAILY  . sildenafil (VIAGRA) 100 MG tablet Take 0.5-1 tablets (50-100 mg total) by mouth daily as needed for erectile dysfunction.  . tamsulosin (FLOMAX) 0.4 MG CAPS capsule Take 1 capsule (0.4 mg total) by mouth 2 (two) times daily.  . [DISCONTINUED] azithromycin (ZITHROMAX) 250 MG tablet 2 pills the first day then one daily for infection until completed   No facility-administered encounter medications on file as of 09/29/2015.      Review of Systems  Constitutional: Negative.   HENT: Positive for postnasal drip.   Eyes: Negative.   Respiratory: Positive for cough.   Cardiovascular: Negative.   Gastrointestinal:  Negative.   Endocrine: Negative.   Genitourinary: Negative.   Musculoskeletal: Negative.   Skin: Negative.   Allergic/Immunologic: Negative.   Neurological: Negative.   Hematological: Negative.   Psychiatric/Behavioral: Negative.        Objective:   Physical Exam  Constitutional: He is oriented to person, place, and time. He appears well-developed and well-nourished. No distress.  HENT:  Head: Normocephalic and atraumatic.  Right Ear: External ear normal.  Left Ear: External ear normal.  Mouth/Throat: Oropharynx is clear and moist. No oropharyngeal exudate.  Nasal congestion bilaterally with turbinate swelling  Eyes: Conjunctivae and EOM are normal. Pupils are equal, round, and reactive to light. Right eye exhibits no discharge. Left eye exhibits no discharge. No scleral icterus.  Neck: Normal range of motion. Neck supple. No thyromegaly present.  No anterior cervical adenopathy  Cardiovascular: Normal rate, regular rhythm and normal heart sounds.   No murmur heard. Pulmonary/Chest: Effort normal. No respiratory distress. He has no wheezes. He has no rales. He exhibits no tenderness.  Irritated cough up her airways right side posteriorly. No rales or rhonchi  Genitourinary: Rectum normal and prostate normal.  Musculoskeletal: Normal range of motion. He exhibits no edema.  Lymphadenopathy:    He has no cervical adenopathy.  Neurological: He is alert and oriented to person, place, and time. No cranial nerve deficit.  Skin: Skin is warm and dry. No rash noted.  Psychiatric: He has a normal mood and affect. His behavior  is normal. Judgment and thought content normal.  Nursing note and vitals reviewed.  BP 131/80 mmHg  Pulse 63  Temp(Src) 97.3 F (36.3 C) (Oral)  Ht _0  (1.702 m)  Wt 208 lb (94.348 kg)  BMI 32.57 kg/m2         Assessment & Plan:  1. Essential hypertension -Blood pressure remains good and patient should continue with current treatment - BMP8+EGFR -  CBC with Differential/Platelet - Hepatic function panel - NMR, lipoprofile  2. BPH (benign prostatic hyperplasia) -Follow-up with urology as planned - CBC with Differential/Platelet  3. Vitamin D deficiency -Continue vitamin D replacement pending results of lab work - CBC with Differential/Platelet - VITAMIN D 25 Hydroxy (Vit-D Deficiency, Fractures)  4. Other seasonal allergic rhinitis -Take medicines as indicated below - fluticasone (FLONASE) 50 MCG/ACT nasal spray; One to 2 sprays each nostril at bedtime and stay on this medication for that at least the next 4-6 weeks  Dispense: 16 g; Refill: 6 - predniSONE (DELTASONE) 10 MG tablet; 1 tablet 4 times a day for 2 days,  1 tablet 3 times a day for 2 days,  1 tablet 2 times a day for 2 days, 1 tablet daily for 2 days  Dispense: 20 tablet; Refill: 0 - methylPREDNISolone acetate (DEPO-MEDROL) injection 60 mg; Inject 0.75 mLs (60 mg total) into the muscle once.  5. Bronchitis with bronchospasm -Continue with Mucinex and do the course of prednisone and if not better in a couple weeks call back - predniSONE (DELTASONE) 10 MG tablet; 1 tablet 4 times a day for 2 days,  1 tablet 3 times a day for 2 days,  1 tablet 2 times a day for 2 days, 1 tablet daily for 2 days  Dispense: 20 tablet; Refill: 0 - methylPREDNISolone acetate (DEPO-MEDROL) injection 60 mg; Inject 0.75 mLs (60 mg total) into the muscle once.  Patient Instructions  Continue to drink plenty of fluids and stay well hydrated Keep the house as cool as possible Continue to take Mucinex, blue and white in color, maximum strength, 1 twice daily with a large glass of water for cough and congestion Continue to use nasal saline frequently through the day and use the prescription Flonase 1-2 sprays each nostril at bedtime Take the prednisone as directed and until completed If you are not better in a couple weeks and get back in touch with [place] and we will go from there.   Arrie Senate  MD

## 2015-09-29 NOTE — Patient Instructions (Signed)
Continue to drink plenty of fluids and stay well hydrated Keep the house as cool as possible Continue to take Mucinex, blue and white in color, maximum strength, 1 twice daily with a large glass of water for cough and congestion Continue to use nasal saline frequently through the day and use the prescription Flonase 1-2 sprays each nostril at bedtime Take the prednisone as directed and until completed If you are not better in a couple weeks and get back in touch with [place] and we will go from there.

## 2015-09-30 LAB — CBC WITH DIFFERENTIAL/PLATELET
BASOS ABS: 0 10*3/uL (ref 0.0–0.2)
BASOS: 0 %
EOS (ABSOLUTE): 0 10*3/uL (ref 0.0–0.4)
EOS: 1 %
HEMATOCRIT: 42.7 % (ref 37.5–51.0)
HEMOGLOBIN: 14.8 g/dL (ref 12.6–17.7)
IMMATURE GRANS (ABS): 0 10*3/uL (ref 0.0–0.1)
Immature Granulocytes: 0 %
LYMPHS ABS: 1.5 10*3/uL (ref 0.7–3.1)
Lymphs: 29 %
MCH: 29 pg (ref 26.6–33.0)
MCHC: 34.7 g/dL (ref 31.5–35.7)
MCV: 84 fL (ref 79–97)
MONOCYTES: 15 %
Monocytes Absolute: 0.8 10*3/uL (ref 0.1–0.9)
NEUTROS ABS: 2.8 10*3/uL (ref 1.4–7.0)
Neutrophils: 55 %
Platelets: 194 10*3/uL (ref 150–379)
RBC: 5.11 x10E6/uL (ref 4.14–5.80)
RDW: 14.6 % (ref 12.3–15.4)
WBC: 5.1 10*3/uL (ref 3.4–10.8)

## 2015-09-30 LAB — BMP8+EGFR
BUN/Creatinine Ratio: 12 (ref 10–22)
BUN: 11 mg/dL (ref 8–27)
CALCIUM: 9.4 mg/dL (ref 8.6–10.2)
CHLORIDE: 102 mmol/L (ref 96–106)
CO2: 24 mmol/L (ref 18–29)
Creatinine, Ser: 0.92 mg/dL (ref 0.76–1.27)
GFR calc non Af Amer: 82 mL/min/{1.73_m2} (ref >59)
GFR, EST AFRICAN AMERICAN: 94 mL/min/{1.73_m2} (ref >59)
Glucose: 88 mg/dL (ref 65–99)
POTASSIUM: 3.5 mmol/L (ref 3.5–5.2)
SODIUM: 142 mmol/L (ref 134–144)

## 2015-09-30 LAB — HEPATIC FUNCTION PANEL
ALK PHOS: 88 IU/L (ref 39–117)
ALT: 24 IU/L (ref 0–44)
AST: 19 IU/L (ref 0–40)
Albumin: 4.3 g/dL (ref 3.5–4.8)
BILIRUBIN, DIRECT: 0.15 mg/dL (ref 0.00–0.40)
Bilirubin Total: 0.5 mg/dL (ref 0.0–1.2)
Total Protein: 6.4 g/dL (ref 6.0–8.5)

## 2015-09-30 LAB — NMR, LIPOPROFILE
Cholesterol: 181 mg/dL (ref 100–199)
HDL Cholesterol by NMR: 49 mg/dL (ref >39)
HDL Particle Number: 29.9 umol/L — ABNORMAL LOW (ref >=30.5)
LDL PARTICLE NUMBER: 1362 nmol/L — AB (ref <1000)
LDL SIZE: 21.3 nm (ref >20.5)
LDL-C: 114 mg/dL — AB (ref 0–99)
Small LDL Particle Number: 432 nmol/L (ref <=527)
Triglycerides by NMR: 88 mg/dL (ref 0–149)

## 2015-09-30 LAB — VITAMIN D 25 HYDROXY (VIT D DEFICIENCY, FRACTURES): VIT D 25 HYDROXY: 56.5 ng/mL (ref 30.0–100.0)

## 2015-10-05 ENCOUNTER — Ambulatory Visit: Payer: Medicare Other | Admitting: Family Medicine

## 2015-10-15 ENCOUNTER — Encounter: Payer: Self-pay | Admitting: Pharmacist

## 2015-10-15 ENCOUNTER — Ambulatory Visit (INDEPENDENT_AMBULATORY_CARE_PROVIDER_SITE_OTHER): Payer: Medicare Other | Admitting: Pharmacist

## 2015-10-15 VITALS — BP 131/82 | HR 68 | Ht 67.0 in | Wt 210.5 lb

## 2015-10-15 DIAGNOSIS — E785 Hyperlipidemia, unspecified: Secondary | ICD-10-CM

## 2015-10-15 NOTE — Progress Notes (Signed)
Subjective:    Ricky [name] is a 75 y.o. male who presents for evaluation of dyslipidemia. The patient does not use medications that may worsen dyslipidemias (corticosteroids, progestins, anabolic steroids, diuretics, beta-blockers, amiodarone, cyclosporine, olanzapine). Exercise: never. Previous history of cardiac disease includes: None.  Cardiac Risk Factors Age > 45-male, > 55-male:  YES  +1  Smoking:   NO  Sig. family hx of CHD*:  NO  Hypertension:   YES  +1  Diabetes:   NO  HDL < 35:   NO  HDL > 59:   NO  Total:  2   ADA ASCVD 10 year risk assessment - 27% based on current values  The following portions of the patient's history were reviewed and updated as appropriate: allergies, current medications, past family history, past medical history, past social history, past surgical history and problem list.    Objective:   Filed Vitals:   10/15/15 1131  BP: 131/82  Pulse: 68   Filed Weights   10/15/15 1131  Weight: 210 lb 8 oz (95.482 kg)   Body mass index is 32.96 kg/(m^2).   Lab Review Office Visit on 09/29/2015  Component Date Value  . Glucose 09/29/2015 88   . BUN 09/29/2015 11   . Creatinine, Ser 09/29/2015 0.92   . GFR calc non Af Amer 09/29/2015 82   . GFR calc Af Amer 09/29/2015 94   . BUN/Creatinine Ratio 09/29/2015 12   . Sodium 09/29/2015 142   . Potassium 09/29/2015 3.5   . Chloride 09/29/2015 102   . CO2 09/29/2015 24   . Calcium 09/29/2015 9.4   . WBC 09/29/2015 5.1   . RBC 09/29/2015 5.11   . Hemoglobin 09/29/2015 14.8   . Hematocrit 09/29/2015 42.7   . MCV 09/29/2015 84   . [facility] 09/29/2015 29.0   . MCHC 09/29/2015 34.7   . RDW 09/29/2015 14.6   . Platelets 09/29/2015 194   . Neutrophils 09/29/2015 55   . Lymphs 09/29/2015 29   . Monocytes 09/29/2015 15   . Eos 09/29/2015 1   . Basos 09/29/2015 0   . Neutrophils Absolute 09/29/2015 2.8   . Lymphocytes Absolute 09/29/2015 1.5   . Monocytes Absolute 09/29/2015 0.8   . EOS (ABSOLUTE)  09/29/2015 0.0   . Basophils Absolute 09/29/2015 0.0   . Immature Granulocytes 09/29/2015 0   . Immature Grans (Abs) 09/29/2015 0.0   . Total Protein 09/29/2015 6.4   . Albumin 09/29/2015 4.3   . Bilirubin Total 09/29/2015 0.5   . Bilirubin, Direct 09/29/2015 0.15   . Alkaline Phosphatase 09/29/2015 88   . AST 09/29/2015 19   . ALT 09/29/2015 24   . LDL Particle Number 09/29/2015 [id number]*  . LDL-C 09/29/2015 114*  . HDL Cholesterol by NMR 09/29/2015 49   . Triglycerides by NMR 09/29/2015 88   . Cholesterol 09/29/2015 181   . HDL Particle Number 09/29/2015 29.9*  . Small LDL Particle Number 09/29/2015 [id number]   . LDL Size 09/29/2015 21.3   . LP-IR Score 09/29/2015 <25   . Vit D, 25-Hydroxy 09/29/2015 56.5       Assessment:    Dyslipidemia as detailed above with greater than 7.5% 10 year risk of ASCVD  Target levels for LDL are: < 100 mg/dl (CHD or "CHD risk equivalent" is present)  Explained to the patient the respective contributions of genetics, diet, and exercise to lipid levels and the use of medication in severe cases which do not respond to lifestyle alteration.  The patient's interest and motivation in making lifestyle changes seems good.     Plan:    The following changes are planned for the next 3 months, at which time the patient will return for repeat fasting lipids:  1. Dietary changes: Reduce saturated fat, "trans" monounsaturated fatty acids, and cholesterol 2. Exercise changes:  Advised to engage in bicycling 3 to 4 times weekly for 30 to 40 minutes, with pulse  3. Other treatment Weight reduction (goal is to lose 20#) 4. Lipid-lowering medications: discussed starting statin but patient declined  (Recommended by NCEP after 3-6 months of dietary therapy & lifestyle modification,  except if CHD is present or LDL well above 190.) 5.  RTC in 3 months to rechck lipids and AWV  Note: The majority of the visit was spent in counseling on the pathophysiology and treatment of  dyslipidemias. The total face-to-face time was in excess of 40 minutes.    Cherre Robins, PharmD, CPP

## 2015-10-15 NOTE — Patient Instructions (Signed)
Increase exercise to 150 minutes per week.    Decrease milk to either 1% and 1/2% Continue to decrease serving sizes Snacks - try nuts, fruits and vegetables.

## 2015-10-19 ENCOUNTER — Ambulatory Visit (INDEPENDENT_AMBULATORY_CARE_PROVIDER_SITE_OTHER): Payer: Medicare Other

## 2015-10-19 ENCOUNTER — Ambulatory Visit (INDEPENDENT_AMBULATORY_CARE_PROVIDER_SITE_OTHER): Payer: Medicare Other | Admitting: Family Medicine

## 2015-10-19 ENCOUNTER — Encounter: Payer: Self-pay | Admitting: Family Medicine

## 2015-10-19 VITALS — BP 149/88 | HR 67 | Temp 97.0°F | Ht 67.0 in | Wt 209.0 lb

## 2015-10-19 DIAGNOSIS — J9801 Acute bronchospasm: Secondary | ICD-10-CM

## 2015-10-19 DIAGNOSIS — R059 Cough, unspecified: Secondary | ICD-10-CM

## 2015-10-19 DIAGNOSIS — B079 Viral wart, unspecified: Secondary | ICD-10-CM

## 2015-10-19 DIAGNOSIS — R05 Cough: Secondary | ICD-10-CM

## 2015-10-19 MED ORDER — LEVOFLOXACIN 500 MG PO TABS: 500.0000 mg | ORAL_TABLET | Freq: Every day | ORAL | Status: DC

## 2015-10-19 NOTE — Progress Notes (Signed)
Subjective:    Patient ID: Ricky [name], male    DOB: [birth year], 75 y.o.   MRN: [id number]  HPI Patient here today for follow up on cough  - not getting much better. He also has a skin lesion on his left thigh that concerns him. The cough and congestion has been going on since the middle of December. It has become irritating. He describes no fever and no purulent sputum. His last chest x-ray was over year ago. He is also concerned about an irritated wart on his left medial thigh.      Patient Active Problem List   Diagnosis Date Noted  . Hyperlipidemia 10/15/2015  . Vitamin D deficiency 03/12/2014  . BPH (benign prostatic hyperplasia) 10/01/2013  . Erectile dysfunction 10/01/2013  . PERSONAL HX COLONIC POLYPS 01/25/2010  . 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 HYPERTENSION, UNSPECIFIED 01/13/2009   Outpatient Encounter Prescriptions as of 10/19/2015  Medication Sig  . amLODipine (NORVASC) 10 MG tablet TAKE 1 BY MOUTH DAILY  . budesonide-formoterol (SYMBICORT) 160-4.5 MCG/ACT inhaler Inhale 2 puffs into the lungs 2 (two) times daily. Rinse mouth after uses  . Cholecalciferol (VITAMIN D PO) Take by mouth.  . doxazosin (CARDURA) 2 MG tablet Take 1 tablet (2 mg total) by mouth daily.  Marland Kitchen FIBER FORMULA PO Take 3 capsules by mouth 2 (two) times daily.  . fluticasone (FLONASE) 50 MCG/ACT nasal spray One to 2 sprays each nostril at bedtime and stay on this medication for that at least the next 4-6 weeks  . losartan (COZAAR) 100 MG tablet TAKE 1 BY MOUTH DAILY  . potassium chloride SA (K-DUR,KLOR-CON) 20 MEQ tablet TAKE 1 BY MOUTH 3 TIMES DAILY  . predniSONE (DELTASONE) 10 MG tablet 1 tablet 4 times a day for 2 days,  1 tablet 3 times a day for 2 days,  1 tablet 2 times a day for 2 days, 1 tablet daily for 2 days  . sildenafil (VIAGRA) 100 MG tablet Take 0.5-1 tablets (50-100 mg total) by mouth daily as needed for erectile dysfunction.  . tamsulosin (FLOMAX) 0.4 MG CAPS  capsule Take 1 capsule (0.4 mg total) by mouth 2 (two) times daily.   No facility-administered encounter medications on file as of 10/19/2015.      Review of Systems  Constitutional: Negative.   HENT: Negative.   Eyes: Negative.   Respiratory: Positive for choking and shortness of breath (with cough).   Cardiovascular: Negative.   Gastrointestinal: Negative.   Endocrine: Negative.   Genitourinary: Negative.   Musculoskeletal: Negative.   Skin: Negative.        Lesion - left thigh   Allergic/Immunologic: Negative.   Neurological: Negative.   Hematological: Negative.   Psychiatric/Behavioral: Negative.        Objective:   Physical Exam  Constitutional: He is oriented to person, place, and time. He appears well-developed and well-nourished. No distress.  HENT:  Head: Normocephalic and atraumatic.  Right Ear: External ear normal.  Left Ear: External ear normal.  Mouth/Throat: Oropharynx is clear and moist. No oropharyngeal exudate.  Nasal pallor bilaterally with some clear congestion  Eyes: Conjunctivae and EOM are normal. Pupils are equal, round, and reactive to light. Right eye exhibits no discharge. Left eye exhibits no discharge. No scleral icterus.  Neck: Normal range of motion. Neck supple. No thyromegaly present.  No thyromegaly or anterior cervical adenopathy  Cardiovascular: Normal rate, regular rhythm and normal heart sounds.   No murmur heard. Pulmonary/Chest: Effort normal  and breath sounds normal. No respiratory distress. He has no wheezes. He has no rales. He exhibits no tenderness.  Dry cough with slightly increased congestion on the right compared to the left  Abdominal: Soft. Bowel sounds are normal. He exhibits no mass.  Musculoskeletal: Normal range of motion. He exhibits no edema.  Lymphadenopathy:    He has no cervical adenopathy.  Neurological: He is alert and oriented to person, place, and time.  Skin: Skin is warm and dry. No rash noted.  Cryotherapy  was performed on a wart that was irritated on the left medial thigh without problems  Psychiatric: He has a normal mood and affect. His behavior is normal. Judgment and thought content normal.  Nursing note and vitals reviewed.   BP 149/88 mmHg  Pulse 67  Temp(Src) 97 F (36.1 C) (Oral)  Ht 5\' 7"  (1.702 m)  Wt 209 lb (94.802 kg)  BMI 32.73 kg/m2  [facility] reading (PRIMARY) by  Dr. Brunilda Payor x-ray- results were not available when patient left the office.                                     Assessment & Plan:  1. Cough -This is been a persistent cough despite treatment with steroids and antibiotics. It is minimally productive. -He should take the Levaquin 500 one daily for 7 days, use cool mist humidification, continue with Symbicort, continue with Flonase and nasal saline -Also continue with Mucinex one twice daily with a large glass of water - CBC with Differential/Platelet - DG Chest 2 View; Future - Ambulatory referral to Pulmonology  2. Wart -Cryotherapy to the wart without problems  3. Bronchospasm -Continue with treatments as noted above  Meds ordered this encounter  Medications  . levofloxacin (LEVAQUIN) 500 MG tablet    Sig: Take 1 tablet (500 mg total) by mouth daily.    Dispense:  7 tablet    Refill:  0   Patient Instructions  Cool mist humidifier Keep house cool  Continue inhalers and nose sprays We will arrange an appt with Pulmonology and call you. Continue with Mucinex      Arrie Senate MD

## 2015-10-19 NOTE — Patient Instructions (Addendum)
Cool mist humidifier Keep house cool  Continue inhalers and nose sprays We will arrange an appt with Pulmonology and call you. Continue with Mucinex

## 2015-10-20 LAB — CBC WITH DIFFERENTIAL/PLATELET
Basophils Absolute: 0 10*3/uL (ref 0.0–0.2)
Basos: 0 %
EOS (ABSOLUTE): 0.1 10*3/uL (ref 0.0–0.4)
EOS: 1 %
HEMATOCRIT: 43.4 % (ref 37.5–51.0)
Hemoglobin: 14.9 g/dL (ref 12.6–17.7)
Immature Grans (Abs): 0 10*3/uL (ref 0.0–0.1)
Immature Granulocytes: 0 %
LYMPHS ABS: 1.1 10*3/uL (ref 0.7–3.1)
Lymphs: 24 %
MCH: 29.3 pg (ref 26.6–33.0)
MCHC: 34.3 g/dL (ref 31.5–35.7)
MCV: 85 fL (ref 79–97)
MONOS ABS: 0.5 10*3/uL (ref 0.1–0.9)
Monocytes: 11 %
Neutrophils Absolute: 3 10*3/uL (ref 1.4–7.0)
Neutrophils: 64 %
Platelets: 180 10*3/uL (ref 150–379)
RBC: 5.08 x10E6/uL (ref 4.14–5.80)
RDW: 13.9 % (ref 12.3–15.4)
WBC: 4.7 10*3/uL (ref 3.4–10.8)

## 2015-10-27 ENCOUNTER — Telehealth: Payer: Self-pay | Admitting: Family Medicine

## 2015-10-28 NOTE — Telephone Encounter (Signed)
Pt aware of pulmonary ph #

## 2015-10-28 NOTE — Telephone Encounter (Signed)
I tried to get information from patient on what he needed but he was adamant to speak with you.

## 2015-11-11 ENCOUNTER — Ambulatory Visit (INDEPENDENT_AMBULATORY_CARE_PROVIDER_SITE_OTHER): Payer: Medicare Other | Admitting: Internal Medicine

## 2015-11-11 ENCOUNTER — Encounter: Payer: Self-pay | Admitting: Internal Medicine

## 2015-11-11 VITALS — BP 120/76 | HR 63 | Ht 67.0 in | Wt 213.4 lb

## 2015-11-11 DIAGNOSIS — J45991 Cough variant asthma: Secondary | ICD-10-CM | POA: Diagnosis not present

## 2015-11-11 MED ORDER — BENZONATATE 200 MG PO CAPS: ORAL_CAPSULE | ORAL | Status: DC

## 2015-11-11 MED ORDER — PREDNISONE 10 MG PO TABS: ORAL_TABLET | ORAL | Status: DC

## 2015-11-11 NOTE — Progress Notes (Signed)
Subjective:    Patient ID: Ricky [name], male    DOB: 1941/01/12,   MRN: [id number]  HPI  10 yowm never smoker with recurrent cough 2008  typically in winter better with inhalers just uses for a few weeks and stops and no need for continued treatment but continues to recur so referred to pulmonary clinic 11/11/2015 by Dr Laurance Flatten    11/11/2015 1st [facility] office visit/ Saim Almanza   Chief Complaint  Patient presents with  . Pulmonary Consult    Referred by Dr. Redge Gainer. Pt c/o cough and SOB x  month. Symptoms are worse when he talks. Cough is occ prod with min clear sputum.   onset was acute  4 weeks prior to OV  And is "90%" better now with mostly coughing with use of voice and if not coughing he's not usually sob.  Cough continues to gradually improve despite stopping symbicort sev weeks prior to OV    No obvious   patterns in day to day or daytime variabilty or assoc   cp or chest tightness, subjective wheeze overt sinus or hb symptoms. No unusual exp hx or h/o childhood pna/ asthma or knowledge of premature birth.  Sleeping ok without nocturnal  or early am exacerbation  of respiratory  c/o's or need for noct saba. Also denies any obvious fluctuation of symptoms with weather or environmental changes or other aggravating or alleviating factors except as outlined above   Current Medications, Allergies, Complete Past Medical History, Past Surgical History, Family History, and Social History were reviewed in [employer] record.                Review of Systems  Constitutional: Negative for fever, chills, activity change, appetite change and unexpected weight change.  HENT: Negative for congestion, dental problem, postnasal drip, rhinorrhea, sneezing, sore throat, trouble swallowing and voice change.   Eyes: Negative for visual disturbance.  Respiratory: Positive for cough and shortness of breath. Negative for choking.   Cardiovascular: Negative for chest pain  and leg swelling.  Gastrointestinal: Negative for nausea, vomiting and abdominal pain.  Genitourinary: Negative for difficulty urinating.  Musculoskeletal: Negative for arthralgias.  Skin: Negative for rash.  Psychiatric/Behavioral: Negative for behavioral problems and confusion.       Objective:   Physical Exam   amb wm nad  Wt Readings from Last 3 Encounters:  11/11/15 213 lb 6.4 oz (96.798 kg)  10/19/15 209 lb (94.802 kg)  10/15/15 210 lb 8 oz (95.482 kg)    Vital signs reviewed      HEENT: nl dentition, turbinates, and oropharynx. Nl external ear canals without cough reflex   NECK :  without JVD/Nodes/TM/ nl carotid upstrokes bilaterally   LUNGS: no acc muscle use,  Nl contour chest which is clear to A and P bilaterally without cough on insp or exp maneuvers   CV:  RRR  no s3 or murmur or increase in P2, no edema   ABD:  soft and nontender with nl inspiratory excursion in the supine position. No bruits or organomegaly, bowel sounds nl  MS:  Nl gait/ ext warm without deformities, calf tenderness, cyanosis or clubbing No obvious joint restrictions   SKIN: warm and dry without lesions    NEURO:  alert, approp, nl sensorium with  no motor deficits      I personally reviewed images and agree with radiology impression as follows:  CXR:  10/19/15 There is no active cardiopulmonary disease. Mild chronic changes as  described.       Assessment & Plan:

## 2015-11-11 NOTE — Patient Instructions (Addendum)
At the onset of any cough and until gone completely for a week >> Try prilosec otc 20mg   Take 30-60 min before first meal of the day and Pepcid ac (famotidine) 20 mg one @  bedtime until cough is completely gone for at least a week without the need for cough suppression  GERD (REFLUX)  is an extremely common cause of respiratory symptoms just like yours , many times with no obvious heartburn at all.    It can be treated with medication, but also with lifestyle changes including elevation of the head of your bed (ideally with 6 inch  bed blocks),  Smoking cessation, avoidance of late meals, excessive alcohol, and avoid fatty foods, chocolate, peppermint, colas, red wine, and acidic juices such as orange juice.  NO MINT OR MENTHOL PRODUCTS SO NO COUGH DROPS  USE SUGARLESS CANDY INSTEAD (Jolley ranchers or Stover's or Life Savers) or even ice chips will also do - the key is to swallow to prevent all throat clearing. NO OIL BASED VITAMINS - use powdered substitutes.    If not better >  Repeat: Prednisone 10 mg take  4 each am x 2 days,   2 each am x 2 days,  1 each am x 2 days and stop   For cough > tessilon up to  4 x daily   I will make additional recommendations for Dr Laurance Flatten for next year - return here this year if cough not resolved in 2 weeks

## 2015-11-12 ENCOUNTER — Encounter: Payer: Self-pay | Admitting: Internal Medicine

## 2015-11-12 DIAGNOSIS — J45991 Cough variant asthma: Secondary | ICD-10-CM | POA: Insufficient documentation

## 2015-11-12 NOTE — Assessment & Plan Note (Addendum)
This pattern cough is very unusual with the history of exacerbating with talking more than while sleeping strongly indicative of a component of upper airway cough syndrome..   Classic Upper airway cough syndrome, so named because it's frequently impossible to sort out how much is  CR/sinusitis with freq throat clearing (which can be related to primary GERD)   vs  causing  secondary (" extra esophageal")  GERD from wide swings in gastric pressure that occur with throat clearing, often  promoting self use of mint and menthol lozenges that reduce the lower esophageal sphincter tone and exacerbate the problem further in a cyclical fashion.   These are the same pts (now being labeled as having "irritable larynx syndrome" by some cough centers) who not infrequently have a history of having failed to tolerate ace inhibitors,  dry powder inhalers or biphosphonates or report having atypical reflux symptoms that don't respond to standard doses of PPI , and are easily confused as having aecopd or asthma flares by even experienced allergists/ pulmonologists.   For now I would therefore recommend aggressive treatment directed at reflux both non-acid and aspirin related as well as elimination of cyclical coughing by use of either hard rock candy that does not contain major menthol or mint or chocolate  and/or Tessalon Pearls.  As a last resort, we did give him a short course of prednisone.  Since this pattern is recurrent and may be triggered by winter uri's  So  Explained the natural history of uri and why it's necessary in patients at risk to treat GERD aggressively - at least  short term -   to reduce risk of evolving cyclical cough initially  triggered by epithelial injury and a heightened sensitivty to the effects of any upper airway irritants,  most importantly acid - related - then perpetuated by epithelial injury related to the cough itself as the upper airway collapses on itself.  That is, the more sensitive the  epithelium becomes once it is damaged by the virus, the more the ensuing irritability> the more the cough, the more the secondary reflux (especially in those prone to reflux) the more the irritation of the sensitive mucosa and so on in a  Classic cyclical pattern.   We will see if in the future he gets more benefit from aggressive early treatment or cyclical/reflux related cough or whether indeed he does need short courses of Symbicort to get over these. Follow-up can certainly be Dr. Tawanna Sat discretion.  Total time devoted to counseling  = 35/67m review case with pt/ discussion of options/alternatives/ personally creating in presence of pt  then going over specific  Instructions directly with the pt including how to use all of the meds but in particular covering each new medication in detail (see avs)

## 2015-12-15 ENCOUNTER — Encounter: Payer: Self-pay | Admitting: Podiatry

## 2015-12-15 ENCOUNTER — Ambulatory Visit (INDEPENDENT_AMBULATORY_CARE_PROVIDER_SITE_OTHER): Payer: Medicare Other | Admitting: Podiatry

## 2015-12-15 DIAGNOSIS — L600 Ingrowing nail: Secondary | ICD-10-CM

## 2015-12-16 NOTE — Progress Notes (Signed)
Patient ID: Ricky [name], male   DOB: [birth year], 75 y.o.   MRN: [id number]  Subjective: This patient presents today complaining of painful toenails aggravated with shoe wearing walking and that she said night and requesting nail debridement  Objective: Orientated 3 The toenails are elongated, discolored, incurvated and tender to direct palpation 6-10  Assessment: Incurvated toenails 6-10 with primary symptoms in the  hallux toenails  Plan: Debridement of toenails 10 and mechanically and electrically without any Bleeding Discuss possibility of phenol matricectomy to the hallux nails in the future and patient will consider this option  Reappoint at patient's request

## 2015-12-20 DIAGNOSIS — Z08 Encounter for follow-up examination after completed treatment for malignant neoplasm: Secondary | ICD-10-CM | POA: Diagnosis not present

## 2015-12-20 DIAGNOSIS — Z85828 Personal history of other malignant neoplasm of skin: Secondary | ICD-10-CM | POA: Diagnosis not present

## 2015-12-20 DIAGNOSIS — Z1283 Encounter for screening for malignant neoplasm of skin: Secondary | ICD-10-CM | POA: Diagnosis not present

## 2015-12-29 ENCOUNTER — Ambulatory Visit (INDEPENDENT_AMBULATORY_CARE_PROVIDER_SITE_OTHER): Payer: Medicare Other | Admitting: Pharmacist

## 2015-12-29 ENCOUNTER — Encounter: Payer: Self-pay | Admitting: Pharmacist

## 2015-12-29 VITALS — BP 130/72 | HR 82 | Ht 65.0 in | Wt 216.0 lb

## 2015-12-29 DIAGNOSIS — Z1211 Encounter for screening for malignant neoplasm of colon: Secondary | ICD-10-CM

## 2015-12-29 DIAGNOSIS — E785 Hyperlipidemia, unspecified: Secondary | ICD-10-CM

## 2015-12-29 DIAGNOSIS — R059 Cough, unspecified: Secondary | ICD-10-CM

## 2015-12-29 DIAGNOSIS — Z0000 Encounter for general adult medical examination without abnormal findings: Secondary | ICD-10-CM | POA: Diagnosis not present

## 2015-12-29 DIAGNOSIS — R05 Cough: Secondary | ICD-10-CM

## 2015-12-29 NOTE — Patient Instructions (Addendum)
Ricky [name] , Thank you for taking time to come for your Medicare Wellness Visit. I appreciate your ongoing commitment to your health goals. Please review the following plan we discussed and let me know if I can assist you in the future.   These are the goals we discussed: Try to increase exercise - goal is 150 minutes per week.   Increase non-starchy vegetables - carrots, green bean, squash, zucchini, tomatoes, onions, peppers, spinach and other green leafy vegetables, cabbage, lettuce, cucumbers, asparagus, okra (not fried), eggplant limit sugar and processed foods (cakes, cookies, ice cream, crackers and chips) Increase fresh fruit but limit serving sizes 1/2 cup or about the size of tennis or baseball limit red meat to no more than 1-2 times per week (serving size about the size of your palm) Choose whole grains / lean proteins - whole wheat bread, quinoa, whole grain rice (1/2 cup), fish, chicken, [place]   This is a list of the screening recommended for you and due dates:  Health Maintenance  Topic Date Due  . Stool Blood Test  06/28/1991 - remember to bring in stool test  . Pneumonia vaccines (1 of 2 - PCV13) 06/27/2006 - will get 12/31/15  . Tetanus Vaccine  06/10/2015 - will get 12/31/15  . Flu Shot  05/09/2016  . Colon Cancer Screening  08/08/2018  . Shingles Vaccine  Completed  *Topic was postponed. The date shown is not the original due date.    Fall Prevention in the Home  Falls can cause injuries and can affect people from all age groups. There are many simple things that you can do to make your home safe and to help prevent falls. WHAT CAN I DO ON THE OUTSIDE OF MY HOME?  Regularly repair the edges of walkways and driveways and fix any cracks.  Remove high doorway thresholds.  Trim any shrubbery on the main path into your home.  Use bright outdoor lighting.  Clear walkways of debris and clutter, including tools and rocks.  Regularly check that handrails are securely  fastened and in good repair. Both sides of any steps should have handrails.  Install guardrails along the edges of any raised decks or porches.  Have leaves, snow, and ice cleared regularly.  Use sand or salt on walkways during winter months.  In the garage, clean up any spills right away, including grease or oil spills. WHAT CAN I DO IN THE BATHROOM?  Use night lights.  Install grab bars by the toilet and in the tub and shower. Do not use towel bars as grab bars.  Use non-skid mats or decals on the floor of the tub or shower.  If you need to sit down while you are in the shower, use a plastic, non-slip stool.Marland Kitchen  Keep the floor dry. Immediately clean up any water that spills on the floor.  Remove soap buildup in the tub or shower on a regular basis.  Attach bath mats securely with double-sided non-slip rug tape.  Remove throw rugs and other tripping hazards from the floor. WHAT CAN I DO IN THE BEDROOM?  Use night lights.  Make sure that a bedside light is easy to reach.  Do not use oversized bedding that drapes onto the floor.  Have a firm chair that has side arms to use for getting dressed.  Remove throw rugs and other tripping hazards from the floor. WHAT CAN I DO IN THE KITCHEN?   Clean up any spills right away.  Avoid walking on  wet floors.  Place frequently used items in easy-to-reach places.  If you need to reach for something above you, use a sturdy step stool that has a grab bar.  Keep electrical cables out of the way.  Do not use floor polish or wax that makes floors slippery. If you have to use wax, make sure that it is non-skid floor wax.  Remove throw rugs and other tripping hazards from the floor. WHAT CAN I DO IN THE STAIRWAYS?  Do not leave any items on the stairs.  Make sure that there are handrails on both sides of the stairs. Fix handrails that are broken or loose. Make sure that handrails are as long as the stairways.  Check any carpeting to  make sure that it is firmly attached to the stairs. Fix any carpet that is loose or worn.  Avoid having throw rugs at the top or bottom of stairways, or secure the rugs with carpet tape to prevent them from moving.  Make sure that you have a light switch at the top of the stairs and the bottom of the stairs. If you do not have them, have them installed. WHAT ARE SOME OTHER FALL PREVENTION TIPS?  Wear closed-toe shoes that fit well and support your feet. Wear shoes that have rubber soles or low heels.  When you use a stepladder, make sure that it is completely opened and that the sides are firmly locked. Have someone hold the ladder while you are using it. Do not climb a closed stepladder.  Add color or contrast paint or tape to grab bars and handrails in your home. Place contrasting color strips on the first and last steps.  Use mobility aids as needed, such as canes, walkers, scooters, and crutches.  Turn on lights if it is dark. Replace any light bulbs that burn out.  Set up furniture so that there are clear paths. Keep the furniture in the same spot.  Fix any uneven floor surfaces.  Choose a carpet design that does not hide the edge of steps of a stairway.  Be aware of any and all pets.  Review your medicines with your healthcare provider. Some medicines can cause dizziness or changes in blood pressure, which increase your risk of falling. Talk with your health care provider about other ways that you can decrease your risk of falls. This may include working with a physical therapist or trainer to improve your strength, balance, and endurance.   This information is not intended to replace advice given to you by your health care provider. Make sure you discuss any questions you have with your health care provider.   Document Released: 09/15/2002 Document Revised: 02/09/2015 Document Reviewed: 10/30/2014 Elsevier Interactive Patient Education 2016 Ricky [name]  Maintenance, Male A healthy lifestyle and preventative care can promote health and wellness.  Maintain regular health, dental, and eye exams.  Eat a healthy diet. Foods like vegetables, fruits, whole grains, low-fat dairy products, and lean protein foods contain the nutrients you need and are low in calories. Decrease your intake of foods high in solid fats, added sugars, and salt. Get information about a proper diet from your health care provider, if necessary.  Regular physical exercise is one of the most important things you can do for your health. Most adults should get at least 150 minutes of moderate-intensity exercise (any activity that increases your heart rate and causes you to sweat) each week. In addition, most adults need muscle-strengthening exercises on 2  or more days a week.   Maintain a healthy weight. The body mass index (BMI) is a screening tool to identify possible weight problems. It provides an estimate of body fat based on height and weight. Your health care provider can find your BMI and can help you achieve or maintain a healthy weight. For males 20 years and older:  A BMI below 18.5 is considered underweight.  A BMI of 18.5 to 24.9 is normal.  A BMI of 25 to 29.9 is considered overweight.  A BMI of 30 and above is considered obese.  Maintain normal blood lipids and cholesterol by exercising and minimizing your intake of saturated fat. Eat a balanced diet with plenty of fruits and vegetables. Blood tests for lipids and cholesterol should begin at age 31 and be repeated every 5 years. If your lipid or cholesterol levels are high, you are over age 28, or you are at high risk for heart disease, you may need your cholesterol levels checked more frequently.Ongoing high lipid and cholesterol levels should be treated with medicines if diet and exercise are not working.  If you smoke, find out from your health care provider how to quit. If you do not use tobacco, do not  start.  Lung cancer screening is recommended for adults aged 55-80 years who are at high risk for developing lung cancer because of a history of smoking. A yearly low-dose CT scan of the lungs is recommended for people who have at least a 30-pack-year history of smoking and are current smokers or have quit within the past 15 years. A pack year of smoking is smoking an average of 1 pack of cigarettes a day for 1 year (for example, a 30-pack-year history of smoking could mean smoking 1 pack a day for 30 years or 2 packs a day for 15 years). Yearly screening should continue until the smoker has stopped smoking for at least 15 years. Yearly screening should be stopped for people who develop a health problem that would prevent them from having lung cancer treatment.  If you choose to drink alcohol, do not have more than 2 drinks per day. One drink is considered to be 12 oz (360 mL) of beer, 5 oz (150 mL) of wine, or 1.5 oz (45 mL) of liquor.  Avoid the use of street drugs. Do not share needles with anyone. Ask for help if you need support or instructions about stopping the use of drugs.  High blood pressure causes heart disease and increases the risk of stroke. High blood pressure is more likely to develop in:  People who have blood pressure in the end of the normal range (100-139/85-89 mm Hg).  People who are overweight or obese.  People who are African American.  If you are 85-50 years of age, have your blood pressure checked every 3-5 years. If you are 71 years of age or older, have your blood pressure checked every year. You should have your blood pressure measured twice--once when you are at a hospital or clinic, and once when you are not at a hospital or clinic. Record the average of the two measurements. To check your blood pressure when you are not at a hospital or clinic, you can use:  An automated blood pressure machine at a pharmacy.  A home blood pressure monitor.  If you are 53-79 years  old, ask your health care provider if you should take aspirin to prevent heart disease.  Diabetes screening involves taking a blood sample  to check your fasting blood sugar level. This should be done once every 3 years after age 54 if you are at a normal weight and without risk factors for diabetes. Testing should be considered at a younger age or be carried out more frequently if you are overweight and have at least 1 risk factor for diabetes.  Colorectal cancer can be detected and often prevented. Most routine colorectal cancer screening begins at the age of 37 and continues through age 51. However, your health care provider may recommend screening at an earlier age if you have risk factors for colon cancer. On a yearly basis, your health care provider may provide home test kits to check for hidden blood in the stool. A small camera at the end of a tube may be used to directly examine the colon (sigmoidoscopy or colonoscopy) to detect the earliest forms of colorectal cancer. Talk to your health care provider about this at age 71 when routine screening begins. A direct exam of the colon should be repeated every 5-10 years through age 69, unless early forms of precancerous polyps or small growths are found.  People who are at an increased risk for hepatitis B should be screened for this virus. You are considered at high risk for hepatitis B if:  You were born in a country where hepatitis B occurs often. Talk with your health care provider about which countries are considered high risk.  Your parents were born in a high-risk country and you have not received a shot to protect against hepatitis B (hepatitis B vaccine).  You have HIV or AIDS.  You use needles to inject street drugs.  You live with, or have sex with, someone who has hepatitis B.  You are a man who has sex with other men (MSM).  You get hemodialysis treatment.  You take certain medicines for conditions like cancer, organ  transplantation, and autoimmune conditions.  Hepatitis C blood testing is recommended for all people born from [birth year] through 1965 and any individual with known risk factors for hepatitis C.  Healthy men should no longer receive prostate-specific antigen (PSA) blood tests as part of routine cancer screening. Talk to your health care provider about prostate cancer screening.  Testicular cancer screening is not recommended for adolescents or adult males who have no symptoms. Screening includes self-exam, a health care provider exam, and other screening tests. Consult with your health care provider about any symptoms you have or any concerns you have about testicular cancer.  Practice safe sex. Use condoms and avoid high-risk sexual practices to reduce the spread of sexually transmitted infections (STIs).  You should be screened for STIs, including gonorrhea and chlamydia if:  You are sexually active and are younger than 24 years.  You are older than 24 years, and your health care provider tells you that you are at risk for this type of infection.  Your sexual activity has changed since you were last screened, and you are at an increased risk for chlamydia or gonorrhea. Ask your health care provider if you are at risk.  If you are at risk of being infected with HIV, it is recommended that you take a prescription medicine daily to prevent HIV infection. This is called pre-exposure prophylaxis (PrEP). You are considered at risk if:  You are a man who has sex with other men (MSM).  You are a heterosexual man who is sexually active with multiple partners.  You take drugs by injection.  You are sexually active  with a partner who has HIV.  Talk with your health care provider about whether you are at high risk of being infected with HIV. If you choose to begin PrEP, you should first be tested for HIV. You should then be tested every 3 months for as long as you are taking PrEP.  Use sunscreen. Apply  sunscreen liberally and repeatedly throughout the day. You should seek shade when your shadow is shorter than you. Protect yourself by wearing long sleeves, pants, a wide-brimmed hat, and sunglasses year round whenever you are outdoors.  Tell your health care provider of new moles or changes in moles, especially if there is a change in shape or color. Also, tell your health care provider if a mole is larger than the size of a pencil eraser.  A one-time screening for abdominal aortic aneurysm (AAA) and surgical repair of large AAAs by ultrasound is recommended for men aged 82-75 years who are current or former smokers.  Stay current with your vaccines (immunizations).   This information is not intended to replace advice given to you by your health care provider. Make sure you discuss any questions you have with your health care provider.   Document Released: 03/23/2008 Document Revised: 10/16/2014 Document Reviewed: 02/20/2011 Elsevier Interactive Patient Education [employer].

## 2015-12-29 NOTE — Progress Notes (Signed)
Patient ID: Ricky [name], male   DOB: 09/22/1941, 75 y.o.   MRN: [id number]   Subjective:   Ricky [name] is a 75 y.o. white male who presents for an Initial Medicare Annual Wellness Visit and recheck lipids - however patient is not fasting today.  Ricky [name] is married and retired.  He appears well, with abdomin al obesity.  He is very talkative and in NAD today.   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 He saw pulmonologist about 6 weeks ago and follow his instructions but has not seen improvement.  Review of Systems  Review of Systems  HENT: Positive for sore throat.   Eyes: Negative.   Respiratory: Positive for cough and wheezing.   Gastrointestinal: Negative.   Genitourinary: Positive for urgency (has BPH) and frequency.  Musculoskeletal: Positive for myalgias, back pain, joint pain and falls.  Skin: Negative.   Neurological: Positive for weakness.  Endo/Heme/Allergies: Negative.   Psychiatric/Behavioral: Negative.     Current Medications (verified) Outpatient Encounter Prescriptions as of 12/29/2015  Medication Sig  . amLODipine (NORVASC) 10 MG tablet TAKE 1 BY MOUTH DAILY  . Cholecalciferol (VITAMIN D PO) Take 1 capsule by mouth daily.   Marland Kitchen doxazosin (CARDURA) 2 MG tablet Take 1 tablet (2 mg total) by mouth daily.  Marland Kitchen FIBER FORMULA PO Take 3 capsules by mouth 2 (two) times daily.  Marland Kitchen losartan (COZAAR) 100 MG tablet TAKE 1 BY MOUTH DAILY  . potassium chloride SA (K-DUR,KLOR-CON) 20 MEQ tablet TAKE 1 BY MOUTH 3 TIMES DAILY  . sildenafil (VIAGRA) 100 MG tablet Take 0.5-1 tablets (50-100 mg total) by mouth daily as needed for erectile dysfunction.  . tamsulosin (FLOMAX) 0.4 MG CAPS capsule Take 1 capsule (0.4 mg total) by mouth 2 (two) times daily.  . fluticasone (FLONASE) 50 MCG/ACT nasal spray One to 2 sprays each nostril at bedtime and stay on this  medication for that at least the next 4-6 weeks (Patient not taking: Reported on 12/29/2015)  . [DISCONTINUED] benzonatate (TESSALON) 200 MG capsule One four times daily as needed for cough (Patient not taking: Reported on 12/29/2015)  . [DISCONTINUED] predniSONE (DELTASONE) 10 MG tablet Take  4 each am x 2 days,   2 each am x 2 days,  1 each am x 2 days and stop (Patient not taking: Reported on 12/29/2015)   No facility-administered encounter medications on file as of 12/29/2015.    Allergies (verified) Review of patient's allergies indicates no known allergies.   History: Past Medical History  Diagnosis Date  . BPH (benign prostatic hyperplasia)   . Hypertension   . Colon polyps   . Allergy   . Chronic cough   . Cataract    Past Surgical History  Procedure Laterality Date  . Vasectomy    . Knee arthroscopy Bilateral 1960's and 70's  . 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 Rotator cuff repair Right 2004  . Colonoscopy  2011  . Toe surgery Left 1970    little toe  . Eye surgery Right     cataracts  . Eye surgery Left     cataracts  . Hand surgery Left    Family History  Problem Relation Age of Onset  . Healthy Mother   . Cancer Father     prostate  . Colon cancer Neg Hx   .  Esophageal cancer Neg Hx   . Rectal cancer Neg Hx   . Stomach cancer Neg Hx   . Cancer Brother   . Alcohol abuse Brother   . Bipolar disorder Brother    Social History   Occupational History  . Not on file.   Social History Main Topics  . Smoking status: Never Smoker   . Smokeless tobacco: Never Used  . Alcohol Use: Yes     Comment: rare  . Drug Use: No  . Sexual Activity: Yes    Do you feel safe at home?  Yes  Dietary issues and exercise activities: Current Exercise Habits: Home exercise routine, Type of exercise: treadmill, Time (Minutes): 15, Frequency (Times/Week): 2, Weekly Exercise (Minutes/Week): 30, Intensity: Mild  Current Dietary habits:  Trying to limit fat in  diet per our conversation December 2016  Objective:    Today's Vitals   12/29/15 0953  BP: 130/72  Pulse: 82  Height: 5\' 5"  (1.651 m)  Weight: 216 lb (97.977 kg)  PainSc: 0-No pain   Body mass index is 35.94 kg/(m^2).  Activities of Daily Living In your present state of health, do you have any difficulty performing the following activities: 12/29/2015  Hearing? Y  Vision? N  Difficulty concentrating or making decisions? N  Walking or climbing stairs? Y  Dressing or bathing? N  Doing errands, shopping? N  Preparing Food and eating ? N  Using the Toilet? N  In the past six months, have you accidently leaked urine? N  Do you have problems with loss of bowel control? N  Managing your Medications? N  Managing your Finances? N  Housekeeping or managing your Housekeeping? N    Are there smokers in your home (other than you)? No   Cardiac Risk Factors include: advanced age (>56men, >11 women);diabetes mellitus;dyslipidemia;hypertension;male gender;sedentary lifestyle;obesity (BMI >30kg/m2)  Depression Screen PHQ 2/9 Scores 12/29/2015 10/19/2015 09/29/2015 09/21/2015  PHQ - 2 Score 0 0 0 0    Fall Risk Fall Risk  12/29/2015 10/19/2015 09/29/2015 09/21/2015 05/17/2015  Falls in the past year? Yes No No No No  Number falls in past yr: 2 or more - - - -  Injury with Fall? No - - - -  Risk for fall due to : Impaired balance/gait - - - -  Follow up Falls evaluation completed;Falls prevention discussed - - - -    Cognitive Function: MMSE - Mini Mental State Exam 12/29/2015  Not completed: Refused  Orientation to time 5  Orientation to Place 5  Registration 3  Attention/ Calculation 5  Recall 3  Language- name 2 objects 2  Language- repeat 1  Language- follow 3 step command 3  Language- read & follow direction 1  Write a sentence 1  Copy design 1  Total score 30    Immunizations and Health Maintenance Immunization History  Administered Date(s) Administered  .  Influenza,inj,Quad PF,36+ Mos 07/20/2014   Health Maintenance Due  Topic Date Due  . COLON CANCER SCREENING ANNUAL FOBT  06/28/1991  . PNA vac Low Risk Adult (1 of 2 - PCV13) 06/27/2006  . TETANUS/TDAP  06/10/2015    Patient Care Team: Chipper Herb, MD as PCP - General (Family Medicine) Celene Squibb, MD as Consulting Physician (Dermatology) Tanda Rockers, MD as Consulting Physician (Pulmonary Disease) Kathie Rhodes, MD as Consulting Physician (Urology) Rutherford Guys, MD as Consulting Physician (Ophthalmology)  Indicate any recent Medical Services you may have received from other than [facility] providers in the  past year (date may be approximate).    Assessment:    Annual Wellness Visit  Hyperlipidemia - unable to check - pt not fasting   Screening Tests Health Maintenance  Topic Date Due  . COLON CANCER SCREENING ANNUAL FOBT  06/28/1991  . PNA vac Low Risk Adult (1 of 2 - PCV13) 06/27/2006  . TETANUS/TDAP  06/10/2015  . INFLUENZA VACCINE  01/07/2016 (Originally 05/10/2015)  . COLONOSCOPY  08/08/2018  . ZOSTAVAX  Completed        Plan:   During the course of the visit [name] was educated and counseled about the following appropriate screening and preventive services:   Vaccines to include Pneumoccal, Influenza, Hepatitis B, Td, Zostavax - will get prevnar 13 and boostrix at appt Friday 12/31/15  Colorectal cancer screening - FOBT given in office today  Cardiovascular disease screening - will RTC Friday 3/24 to have lipids rechecked.   BP is at goal today  Diabetes screening - UTD and normal   PSA - done by Dr Karsten Ro  Glaucoma screening [place] Area Exam - UTD  Nutrition counseling - discussed decreasing serving sizes   Reviewed BMI and that we would like to see 10% decrease in weight.   Increase physical activity - but recommend evaluation of balance / back pain -  Patient seeing PCP  Advanced Directives - information given today  Referral for specialist for vocal chord  injury  Patient Instructions (the written plan) were given to the patient.   Cherre Robins, [facility]   12/29/2015

## 2015-12-31 ENCOUNTER — Encounter: Payer: Self-pay | Admitting: Family Medicine

## 2015-12-31 ENCOUNTER — Ambulatory Visit (INDEPENDENT_AMBULATORY_CARE_PROVIDER_SITE_OTHER): Payer: Medicare Other | Admitting: Family Medicine

## 2015-12-31 VITALS — BP 140/88 | HR 75 | Temp 97.1°F | Ht 65.0 in | Wt 212.0 lb

## 2015-12-31 DIAGNOSIS — M25561 Pain in right knee: Secondary | ICD-10-CM

## 2015-12-31 DIAGNOSIS — I10 Essential (primary) hypertension: Secondary | ICD-10-CM

## 2015-12-31 DIAGNOSIS — E785 Hyperlipidemia, unspecified: Secondary | ICD-10-CM

## 2015-12-31 DIAGNOSIS — J45991 Cough variant asthma: Secondary | ICD-10-CM | POA: Diagnosis not present

## 2015-12-31 DIAGNOSIS — N400 Enlarged prostate without lower urinary tract symptoms: Secondary | ICD-10-CM

## 2015-12-31 DIAGNOSIS — E559 Vitamin D deficiency, unspecified: Secondary | ICD-10-CM | POA: Diagnosis not present

## 2015-12-31 NOTE — Patient Instructions (Addendum)
Medicare Annual Wellness Visit  [facility] and the medical providers at [facility] strive to bring you the best medical care.  In doing so we not only want to address your current medical conditions and concerns but also to detect new conditions early and prevent illness, disease and health-related problems.    Medicare offers a yearly Wellness Visit which allows our clinical staff to assess your need for preventative services including immunizations, lifestyle education, counseling to decrease risk of preventable diseases and screening for fall risk and other medical concerns.    This visit is provided free of charge (no copay) for all Medicare recipients. The clinical pharmacists at [facility] have begun to conduct these Wellness Visits which will also include a thorough review of all your medications.    As you primary medical provider recommend that you make an appointment for your Annual Wellness Visit if you have not done so already this year.  You may set up this appointment before you leave today or you may call back [phone] and schedule an appointment.  Please make sure when you call that you mention that you are scheduling your Annual Wellness Visit with the clinical pharmacist so that the appointment may be made for the proper length of time.     Continue current medications. Continue good therapeutic lifestyle changes which include good diet and exercise. Fall precautions discussed with patient. If an FOBT was given today- please return it to our front desk. If you are over 50 years old - you may need Prevnar 92 or the adult Pneumonia vaccine.  **Flu shots are available--- please call and schedule a FLU-CLINIC appointment**  After your visit with [place] today you will receive a survey in the mail or online from [employer] regarding your care with [place]. Please take a moment to fill this out. Your feedback is very  important to [place] as you can help [place] better understand your patient needs as well as improve your experience and satisfaction. WE CARE ABOUT YOU!!!   Return to the office for fasting blood work Take medications as recommended by Dr. Melvyn Novas Follow-up with Dr. Melvyn Novas in 2-3 weeks if the cough persist Use a cool mist humidifier in the bedroom at nighttime Wear a mask for respiratory protection especially when in an irritating environment Drink plenty of fluids and stay well hydrated We will arrange an appointment for you to see the orthopedic surgeon here in April

## 2015-12-31 NOTE — Progress Notes (Signed)
Subjective:    Patient ID: Ricky [name], male    DOB: 09/15/1941, 75 y.o.   MRN: [id number]  HPI Pt here for follow up and management of chronic medical problems which includes hypertension. He is taking medications regularly.He does complain of some pain in his right knee. He has not injured his knee. He had both knees replaced several years ago at the same time. The patient denies any chest pain. He still has the cough and some shortness of breath associated with the cough. It is not productive. He has seen the pulmonologist. He took a round of an H2 blocker plus a proton pump inhibitor without relief for the cough. He was given instructions after that to try Tessalon Perles plus prednisone. He was told by the pulmonologist that if this does not relieve him that he should make an appointment for follow-up. The patient understands this. He denies any problems with his stomach nausea vomiting blood in the stool black tarry bowel movements or trouble passing his water at this time. He does see the urologist periodically. 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         Patient Active Problem List   Diagnosis Date Noted  . Cough variant asthma vs UACS  11/12/2015  . Hyperlipidemia 10/15/2015  . Vitamin D deficiency 03/12/2014  . BPH (benign prostatic hyperplasia) 10/01/2013  . Erectile dysfunction 10/01/2013  . PERSONAL HX COLONIC POLYPS 01/25/2010  . 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 HYPERTENSION, UNSPECIFIED 01/13/2009   Outpatient Encounter Prescriptions as of 12/31/2015  Medication Sig  . amLODipine (NORVASC) 10 MG tablet TAKE 1 BY MOUTH DAILY  . Cholecalciferol (VITAMIN D PO) Take 1 capsule by mouth daily.   Marland Kitchen doxazosin (CARDURA) 2 MG tablet Take 1 tablet (2 mg total) by mouth daily.  Marland Kitchen FIBER FORMULA PO Take 3 capsules by mouth 2 (two) times daily.  . fluticasone (FLONASE) 50 MCG/ACT nasal spray One to 2 sprays each nostril at bedtime  and stay on this medication for that at least the next 4-6 weeks  . losartan (COZAAR) 100 MG tablet TAKE 1 BY MOUTH DAILY  . potassium chloride SA (K-DUR,KLOR-CON) 20 MEQ tablet TAKE 1 BY MOUTH 3 TIMES DAILY  . sildenafil (VIAGRA) 100 MG tablet Take 0.5-1 tablets (50-100 mg total) by mouth daily as needed for erectile dysfunction.  . tamsulosin (FLOMAX) 0.4 MG CAPS capsule Take 1 capsule (0.4 mg total) by mouth 2 (two) times daily.   No facility-administered encounter medications on file as of 12/31/2015.     Review of Systems  Constitutional: Negative.   HENT: Negative.   Eyes: Negative.   Respiratory: Negative.   Cardiovascular: Negative.   Gastrointestinal: Negative.   Endocrine: Negative.   Genitourinary: Negative.   Musculoskeletal: Positive for arthralgias (right knee).  Skin: Negative.   Allergic/Immunologic: Negative.   Neurological: Negative.   Hematological: Negative.   Psychiatric/Behavioral: Negative.        Objective:   Physical Exam  Constitutional: He is oriented to person, place, and time. He appears well-developed and well-nourished. No distress.  HENT:  Head: Normocephalic and atraumatic.  Right Ear: External ear normal.  Left Ear: External ear normal.  Mouth/Throat: Oropharynx is clear and moist. No oropharyngeal exudate.  Nasal congestion and turbinate swelling bilaterally  Eyes: Conjunctivae and EOM are normal. Pupils are equal, round, and reactive to light. Right eye exhibits no discharge. Left eye exhibits no discharge. No scleral icterus.  Neck: Normal  range of motion. Neck supple. No thyromegaly present.  Cardiovascular: Normal rate, regular rhythm and normal heart sounds.   No murmur heard. Pulmonary/Chest: Effort normal and breath sounds normal. No respiratory distress. He has no wheezes. He has no rales.  Dry cough with no wheezes rales or rhonchi  Abdominal: Soft. Bowel sounds are normal. He exhibits no mass. There is no tenderness. There is no  rebound and no guarding.  Musculoskeletal: Normal range of motion. He exhibits no edema.  No joint line tenderness to palpation of the right knee or obvious swelling.  Lymphadenopathy:    He has no cervical adenopathy.  Neurological: He is alert and oriented to person, place, and time.  Skin: Skin is warm and dry. No rash noted.  Psychiatric: He has a normal mood and affect. His behavior is normal. Judgment and thought content normal.  Nursing note and vitals reviewed.  BP 140/88 mmHg  Pulse 75  Temp(Src) 97.1 F (36.2 C) (Oral)  Ht 5\' 5"  (1.651 m)  Wt 212 lb (96.163 kg)  BMI 35.28 kg/m2        Assessment & Plan:  1. Hyperlipidemia -Continue diet and therapeutic lifestyle changes pending results of lab work  2. Essential hypertension -Continue current treatment  3. BPH (benign prostatic hyperplasia) -Continue follow-up with urology  4. Vitamin D deficiency -Continue vitamin D replacement pending results of lab work  5. Cough variant asthma vs UACS  -Follow recommendations from pulmonologist with taking Tessalon Perles and prednisone. If not better after taking this make a follow-up appointment with him as he recommended.  6. Right knee pain - Ambulatory referral to Orthopedic Surgery  Patient Instructions                       Medicare Annual Wellness Visit  [facility] and the medical providers at [facility] strive to bring you the best medical care.  In doing so we not only want to address your current medical conditions and concerns but also to detect new conditions early and prevent illness, disease and health-related problems.    Medicare offers a yearly Wellness Visit which allows our clinical staff to assess your need for preventative services including immunizations, lifestyle education, counseling to decrease risk of preventable diseases and screening for fall risk and other medical concerns.    This visit is provided free of charge (no  copay) for all Medicare recipients. The clinical pharmacists at [facility] have begun to conduct these Wellness Visits which will also include a thorough review of all your medications.    As you primary medical provider recommend that you make an appointment for your Annual Wellness Visit if you have not done so already this year.  You may set up this appointment before you leave today or you may call back [phone] and schedule an appointment.  Please make sure when you call that you mention that you are scheduling your Annual Wellness Visit with the clinical pharmacist so that the appointment may be made for the proper length of time.     Continue current medications. Continue good therapeutic lifestyle changes which include good diet and exercise. Fall precautions discussed with patient. If an FOBT was given today- please return it to our front desk. If you are over 75 years old - you may need Prevnar 80 or the adult Pneumonia vaccine.  **Flu shots are available--- please call and schedule a FLU-CLINIC appointment**  After your visit with  [place] today you will receive a survey in the mail or online from [employer] regarding your care with [place]. Please take a moment to fill this out. Your feedback is very important to [place] as you can help [place] better understand your patient needs as well as improve your experience and satisfaction. WE CARE ABOUT YOU!!!   Return to the office for fasting blood work Take medications as recommended by Dr. Melvyn Novas Follow-up with Dr. Melvyn Novas in 2-3 weeks if the cough persist Use a cool mist humidifier in the bedroom at nighttime Wear a mask for respiratory protection especially when in an irritating environment Drink plenty of fluids and stay well hydrated We will arrange an appointment for you to see the orthopedic surgeon here in April   Arrie Senate MD

## 2016-01-01 ENCOUNTER — Other Ambulatory Visit: Payer: Medicare Other

## 2016-01-01 DIAGNOSIS — Z1211 Encounter for screening for malignant neoplasm of colon: Secondary | ICD-10-CM

## 2016-01-01 DIAGNOSIS — E785 Hyperlipidemia, unspecified: Secondary | ICD-10-CM | POA: Diagnosis not present

## 2016-01-02 LAB — FECAL OCCULT BLOOD, IMMUNOCHEMICAL: Fecal Occult Bld: NEGATIVE

## 2016-01-02 LAB — LIPID PANEL
CHOL/HDL RATIO: 3.7 ratio (ref 0.0–5.0)
Cholesterol, Total: 191 mg/dL (ref 100–199)
HDL: 51 mg/dL (ref >39)
LDL CALC: 124 mg/dL — AB (ref 0–99)
Triglycerides: 78 mg/dL (ref 0–149)
VLDL Cholesterol Cal: 16 mg/dL (ref 5–40)

## 2016-01-02 LAB — THYROID PANEL WITH TSH
FREE THYROXINE INDEX: 2.4 (ref 1.2–4.9)
T3 Uptake Ratio: 29 % (ref 24–39)
T4, Total: 8.4 ug/dL (ref 4.5–12.0)
TSH: 0.634 u[IU]/mL (ref 0.450–4.500)

## 2016-01-03 ENCOUNTER — Telehealth: Payer: Self-pay | Admitting: Pharmacist

## 2016-01-03 NOTE — Telephone Encounter (Signed)
FOBT is WNL  LDL is still elevated compared to NMR checked 09/2015. Recommend statin therapy - as patient's 10 year CHD risk is greater than 7.5% TSH / thyroid pane was WNL Tried to call patient - left message to call office for results

## 2016-01-06 NOTE — Telephone Encounter (Signed)
Tried to call again to discuss lab results.  LM on VM.

## 2016-01-10 ENCOUNTER — Telehealth: Payer: Self-pay | Admitting: Family Medicine

## 2016-01-10 NOTE — Telephone Encounter (Signed)
Thanks for trying and we will wait to recheck the next lab work

## 2016-01-10 NOTE — Telephone Encounter (Signed)
Pt is aware of lab results, which statin did you want to start him on?

## 2016-01-10 NOTE — Telephone Encounter (Signed)
Called patient to find out where he would like to rx sent and he changed his mind. He declined to start statin and that he wanted to try TLC for next 3 months.  I advised against this and explained lipids results / goals and CVD risk.  Copy of labs sent to patient.

## 2016-01-11 NOTE — Telephone Encounter (Signed)
Pt aware of appointment date/time, but will need to reschedule due to prior appointment

## 2016-02-03 DIAGNOSIS — Z961 Presence of intraocular lens: Secondary | ICD-10-CM | POA: Diagnosis not present

## 2016-02-03 DIAGNOSIS — K219 Gastro-esophageal reflux disease without esophagitis: Secondary | ICD-10-CM | POA: Diagnosis not present

## 2016-02-03 DIAGNOSIS — R05 Cough: Secondary | ICD-10-CM | POA: Diagnosis not present

## 2016-02-03 DIAGNOSIS — J342 Deviated nasal septum: Secondary | ICD-10-CM | POA: Diagnosis not present

## 2016-02-10 ENCOUNTER — Ambulatory Visit: Payer: Medicare Other | Admitting: Family Medicine

## 2016-02-11 ENCOUNTER — Ambulatory Visit: Payer: Medicare Other | Admitting: Family Medicine

## 2016-03-09 DIAGNOSIS — Z471 Aftercare following joint replacement surgery: Secondary | ICD-10-CM | POA: Diagnosis not present

## 2016-03-09 DIAGNOSIS — Z96653 Presence of artificial knee joint, bilateral: Secondary | ICD-10-CM | POA: Diagnosis not present

## 2016-03-10 DIAGNOSIS — R05 Cough: Secondary | ICD-10-CM | POA: Diagnosis not present

## 2016-03-10 DIAGNOSIS — K219 Gastro-esophageal reflux disease without esophagitis: Secondary | ICD-10-CM | POA: Diagnosis not present

## 2016-05-09 ENCOUNTER — Ambulatory Visit (INDEPENDENT_AMBULATORY_CARE_PROVIDER_SITE_OTHER): Payer: Medicare Other | Admitting: Family Medicine

## 2016-05-09 ENCOUNTER — Encounter: Payer: Self-pay | Admitting: Family Medicine

## 2016-05-09 VITALS — BP 132/82 | HR 61 | Temp 97.3°F | Ht 65.0 in | Wt 204.0 lb

## 2016-05-09 DIAGNOSIS — E559 Vitamin D deficiency, unspecified: Secondary | ICD-10-CM

## 2016-05-09 DIAGNOSIS — I10 Essential (primary) hypertension: Secondary | ICD-10-CM

## 2016-05-09 DIAGNOSIS — J45991 Cough variant asthma: Secondary | ICD-10-CM

## 2016-05-09 DIAGNOSIS — R05 Cough: Secondary | ICD-10-CM

## 2016-05-09 DIAGNOSIS — E785 Hyperlipidemia, unspecified: Secondary | ICD-10-CM

## 2016-05-09 DIAGNOSIS — R0602 Shortness of breath: Secondary | ICD-10-CM | POA: Diagnosis not present

## 2016-05-09 DIAGNOSIS — N400 Enlarged prostate without lower urinary tract symptoms: Secondary | ICD-10-CM | POA: Diagnosis not present

## 2016-05-09 DIAGNOSIS — R059 Cough, unspecified: Secondary | ICD-10-CM

## 2016-05-09 NOTE — Patient Instructions (Addendum)
Continue current medications. Continue good therapeutic lifestyle changes which include good diet and exercise. Fall precautions discussed with patient. If an FOBT was given today- please return it to our front desk. If you are over 75 years old - you may need Prevnar 22 or the adult Pneumonia vaccine.  Flu Shots will be available at our office starting mid- September. Please call and schedule a FLU CLINIC APPOINTMENT.                        Medicare Annual Wellness Visit  [facility] and the medical providers at [facility] strive to bring you the best medical care.  In doing so we not only want to address your current medical conditions and concerns but also to detect new conditions early and prevent illness, disease and health-related problems.    Medicare offers a yearly Wellness Visit which allows our clinical staff to assess your need for preventative services including immunizations, lifestyle education, counseling to decrease risk of preventable diseases and screening for fall risk and other medical concerns.    This visit is provided free of charge (no copay) for all Medicare recipients. The clinical pharmacists at [facility] have begun to conduct these Wellness Visits which will also include a thorough review of all your medications.    As you primary medical provider recommend that you make an appointment for your Annual Wellness Visit if you have not done so already this year.  You may set up this appointment before you leave today or you may call back [phone] and schedule an appointment.  Please make sure when you call that you mention that you are scheduling your Annual Wellness Visit with the clinical pharmacist so that the appointment may be made for the proper length of time.    The patient should follow-up as planned with his urologist in the fall and the ear nose and throat specialist next week. He should take the ranitidine 150  mg, the equate brand that can be purchased at [facility] for a rather inexpensive price and take one twice daily before breakfast and supper. He should really try this before seeing the ear nose and throat specialist to see if this helps his symptoms at all.

## 2016-05-09 NOTE — Progress Notes (Signed)
Subjective:    Patient ID: Ricky [name], male    DOB: Feb 26, 1941, 75 y.o.   MRN: [id number]  HPI  Patient is here today for a follow up on his chronic medical problems which include hyperlipidemia, and hypertension. The last time I saw the patient he was having a lot of respiratory issues and we sent him to the pulmonologist. He was diagnosed with cough variant asthma. They sense have sent him to an ear nose and throat specialist and he has an appointment with him for follow-up next week. He still having some issues with cough and congestion but not as bad as it was back in the winter. He is thinking that some of this is associated with him swallowing and inhaling ACE and spur a couple of years ago and his past. He denies any chest pain or tightness or pressure. He denies any shortness of breath other than what is associated with his cough variant asthma diagnosis. He was asked to try some medication to see if this could be related to reflux but he never started that. We will encourage him to try some ranitidine prior to his visit with ear nose and throat next week. He denies any trouble with swallowing heartburn indigestion nausea vomiting diarrhea or blood in the stool. He is passing his water as well and has nocturia unless he reduces his intake at nighttime. He sees the urologist, Dr. Karsten Ro in the fall. His father died with prostate cancer.  Review of Systems  Constitutional: Negative.   HENT: Negative.   Eyes: Negative.   Respiratory: Negative.   Cardiovascular: Negative.   Gastrointestinal: Negative.   Endocrine: Negative.   Genitourinary: Negative.   Musculoskeletal: Negative.   Skin: Negative.   Allergic/Immunologic: Negative.   Neurological: Negative.   Hematological: Negative.   Psychiatric/Behavioral: Negative.    Patient Active Problem List   Diagnosis Date Noted  . Cough variant asthma vs UACS  11/12/2015  . Hyperlipidemia 10/15/2015  . Vitamin D deficiency 03/12/2014  .  BPH (benign prostatic hyperplasia) 10/01/2013  . Erectile dysfunction 10/01/2013  . PERSONAL HX COLONIC POLYPS 01/25/2010  . 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 Essential hypertension 01/13/2009   Outpatient Encounter Prescriptions as of 05/09/2016  Medication Sig  . amLODipine (NORVASC) 10 MG tablet TAKE 1 BY MOUTH DAILY  . Cholecalciferol (VITAMIN D PO) Take 1 capsule by mouth daily.   Marland Kitchen doxazosin (CARDURA) 2 MG tablet Take 1 tablet (2 mg total) by mouth daily.  Marland Kitchen FIBER FORMULA PO Take 3 capsules by mouth 2 (two) times daily.  . fluticasone (FLONASE) 50 MCG/ACT nasal spray One to 2 sprays each nostril at bedtime and stay on this medication for that at least the next 4-6 weeks  . losartan (COZAAR) 100 MG tablet TAKE 1 BY MOUTH DAILY  . potassium chloride SA (K-DUR,KLOR-CON) 20 MEQ tablet TAKE 1 BY MOUTH 3 TIMES DAILY  . tamsulosin (FLOMAX) 0.4 MG CAPS capsule Take 1 capsule (0.4 mg total) by mouth 2 (two) times daily.  . [DISCONTINUED] sildenafil (VIAGRA) 100 MG tablet Take 0.5-1 tablets (50-100 mg total) by mouth daily as needed for erectile dysfunction. (Patient not taking: Reported on 05/09/2016)   No facility-administered encounter medications on file as of 05/09/2016.        Objective:   Physical Exam  Constitutional: He is oriented to person, place, and time. He appears well-developed and well-nourished. No distress.  HENT:  Head: Normocephalic and atraumatic.  Right Ear: External ear normal.  Left Ear: External ear normal.  Mouth/Throat: Oropharynx is clear and moist. No oropharyngeal exudate.  Slight nasal congestion bilaterally  Eyes: Conjunctivae and EOM are normal. Pupils are equal, round, and reactive to light. Right eye exhibits no discharge. Left eye exhibits no discharge. No scleral icterus.  Neck: Normal range of motion. Neck supple. No thyromegaly present.  No bruits adenopathy or thyromegaly  Cardiovascular: Normal rate, regular rhythm, normal heart sounds  and intact distal pulses.   No murmur heard. The heart has a regular rate and rhythm at 72/m  Pulmonary/Chest: Effort normal and breath sounds normal. No respiratory distress. He has no wheezes. He has no rales. He exhibits no tenderness.  Clear anteriorly and posteriorly  Abdominal: Soft. Bowel sounds are normal. He exhibits no mass. There is no tenderness. There is no rebound and no guarding.  Some generalized tenderness without liver or spleen enlargement or masses or bruits  Genitourinary:  Genitourinary Comments: The patient sees the urologist yearly in the fall  Musculoskeletal: Normal range of motion. He exhibits no edema.  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  Lymphadenopathy:    He has no cervical adenopathy.  Neurological: He is alert and oriented to person, place, and time. He has normal reflexes. No cranial nerve deficit.  Skin: Skin is warm and dry. No rash noted.  Psychiatric: He has a normal mood and affect. His behavior is normal. Judgment and thought content normal.  Nursing note and vitals reviewed.   BP 132/82 (BP Location: Right Arm, Patient Position: Sitting, Cuff Size: Normal)   Pulse 61   Temp 97.3 F (36.3 C) (Oral)   Ht '5\' 5"'  (1.651 m)   Wt 204 lb (92.5 kg)   BMI 33.95 kg/m        Assessment & Plan:  1. Hyperlipidemia -Continue aggressive therapeutic lifestyle changes - Lipid panel - Hepatic function panel  2. Vitamin D deficiency -Continue vitamin D replacement pending results of lab work - VITAMIN D 25 Hydroxy (Vit-D Deficiency, Fractures)  3. Essential hypertension -The blood pressure is good today and he will continue with current treatment - BMP8+EGFR - CBC with Differential/Platelet  4. Cough -Follow-up with ear nose and throat -Try ranitidine 150 mg twice daily before breakfast and supper  5. Shortness of breath -Follow-up with ear nose and throat  6. BPH (benign prostatic hyperplasia) -Follow-up with urology  7. Cough  variant asthma vs UACS  -Follow-up with ENT  Patient Instructions  Continue current medications. Continue good therapeutic lifestyle changes which include good diet and exercise. Fall precautions discussed with patient. If an FOBT was given today- please return it to our front desk. If you are over 25 years old - you may need Prevnar 28 or the adult Pneumonia vaccine.  Flu Shots will be available at our office starting mid- September. Please call and schedule a FLU CLINIC APPOINTMENT.                        Medicare Annual Wellness Visit  [facility] and the medical providers at [facility] strive to bring you the best medical care.  In doing so we not only want to address your current medical conditions and concerns but also to detect new conditions early and prevent illness, disease and health-related problems.    Medicare offers a yearly Wellness Visit which allows our clinical staff to assess your need for preventative services including immunizations, lifestyle education, counseling to decrease risk of preventable  diseases and screening for fall risk and other medical concerns.    This visit is provided free of charge (no copay) for all Medicare recipients. The clinical pharmacists at [facility] have begun to conduct these Wellness Visits which will also include a thorough review of all your medications.    As you primary medical provider recommend that you make an appointment for your Annual Wellness Visit if you have not done so already this year.  You may set up this appointment before you leave today or you may call back [phone]) and schedule an appointment.  Please make sure when you call that you mention that you are scheduling your Annual Wellness Visit with the clinical pharmacist so that the appointment may be made for the proper length of time.    The patient should follow-up as planned with his urologist in the fall and the ear  nose and throat specialist next week. He should take the ranitidine 150 mg, the equate brand that can be purchased at [facility] for a rather inexpensive price and take one twice daily before breakfast and supper. He should really try this before seeing the ear nose and throat specialist to see if this helps his symptoms at all.    Arrie Senate MD

## 2016-05-10 LAB — BMP8+EGFR
BUN / CREAT RATIO: 14 (ref 10–24)
BUN: 13 mg/dL (ref 8–27)
CALCIUM: 9.5 mg/dL (ref 8.6–10.2)
CHLORIDE: 102 mmol/L (ref 96–106)
CO2: 27 mmol/L (ref 18–29)
CREATININE: 0.91 mg/dL (ref 0.76–1.27)
GFR calc Af Amer: 96 mL/min/{1.73_m2} (ref >59)
GFR calc non Af Amer: 83 mL/min/{1.73_m2} (ref >59)
GLUCOSE: 76 mg/dL (ref 65–99)
Potassium: 3.8 mmol/L (ref 3.5–5.2)
Sodium: 143 mmol/L (ref 134–144)

## 2016-05-10 LAB — CBC WITH DIFFERENTIAL/PLATELET
BASOS ABS: 0 10*3/uL (ref 0.0–0.2)
Basos: 0 %
EOS (ABSOLUTE): 0.1 10*3/uL (ref 0.0–0.4)
EOS: 2 %
HEMATOCRIT: 41.6 % (ref 37.5–51.0)
HEMOGLOBIN: 14.1 g/dL (ref 12.6–17.7)
Immature Grans (Abs): 0 10*3/uL (ref 0.0–0.1)
Immature Granulocytes: 0 %
LYMPHS ABS: 1.3 10*3/uL (ref 0.7–3.1)
Lymphs: 30 %
MCH: 29.1 pg (ref 26.6–33.0)
MCHC: 33.9 g/dL (ref 31.5–35.7)
MCV: 86 fL (ref 79–97)
MONOCYTES: 13 %
MONOS ABS: 0.6 10*3/uL (ref 0.1–0.9)
NEUTROS ABS: 2.5 10*3/uL (ref 1.4–7.0)
Neutrophils: 55 %
Platelets: 199 10*3/uL (ref 150–379)
RBC: 4.85 x10E6/uL (ref 4.14–5.80)
RDW: 13.6 % (ref 12.3–15.4)
WBC: 4.5 10*3/uL (ref 3.4–10.8)

## 2016-05-10 LAB — HEPATIC FUNCTION PANEL
ALK PHOS: 72 IU/L (ref 39–117)
ALT: 24 IU/L (ref 0–44)
AST: 17 IU/L (ref 0–40)
Albumin: 4.6 g/dL (ref 3.5–4.8)
BILIRUBIN, DIRECT: 0.13 mg/dL (ref 0.00–0.40)
Bilirubin Total: 0.5 mg/dL (ref 0.0–1.2)
TOTAL PROTEIN: 6.4 g/dL (ref 6.0–8.5)

## 2016-05-10 LAB — LIPID PANEL
CHOLESTEROL TOTAL: 190 mg/dL (ref 100–199)
Chol/HDL Ratio: 3.7 ratio units (ref 0.0–5.0)
HDL: 52 mg/dL (ref >39)
LDL Calculated: 119 mg/dL — ABNORMAL HIGH (ref 0–99)
TRIGLYCERIDES: 96 mg/dL (ref 0–149)
VLDL CHOLESTEROL CAL: 19 mg/dL (ref 5–40)

## 2016-05-10 LAB — VITAMIN D 25 HYDROXY (VIT D DEFICIENCY, FRACTURES): VIT D 25 HYDROXY: 47 ng/mL (ref 30.0–100.0)

## 2016-05-17 ENCOUNTER — Other Ambulatory Visit: Payer: Self-pay | Admitting: Family Medicine

## 2016-05-22 ENCOUNTER — Telehealth: Payer: Self-pay | Admitting: Family Medicine

## 2016-05-22 ENCOUNTER — Other Ambulatory Visit: Payer: Self-pay

## 2016-05-22 MED ORDER — DOXAZOSIN MESYLATE 2 MG PO TABS: 2.0000 mg | ORAL_TABLET | Freq: Every day | ORAL | 1 refills | Status: DC

## 2016-05-22 NOTE — Telephone Encounter (Signed)
Patient states he needs his doxazo/sin sent [facility] of [facility]. Rx sent to correct pharmacy.

## 2016-05-24 DIAGNOSIS — R053 Chronic cough: Secondary | ICD-10-CM | POA: Insufficient documentation

## 2016-05-24 DIAGNOSIS — R05 Cough: Secondary | ICD-10-CM | POA: Insufficient documentation

## 2016-05-24 DIAGNOSIS — K219 Gastro-esophageal reflux disease without esophagitis: Secondary | ICD-10-CM | POA: Diagnosis not present

## 2016-06-15 DIAGNOSIS — N3281 Overactive bladder: Secondary | ICD-10-CM | POA: Diagnosis not present

## 2016-06-15 DIAGNOSIS — R972 Elevated prostate specific antigen [PSA]: Secondary | ICD-10-CM | POA: Diagnosis not present

## 2016-06-15 DIAGNOSIS — N401 Enlarged prostate with lower urinary tract symptoms: Secondary | ICD-10-CM | POA: Diagnosis not present

## 2016-06-15 DIAGNOSIS — R351 Nocturia: Secondary | ICD-10-CM | POA: Diagnosis not present

## 2016-06-28 ENCOUNTER — Telehealth: Payer: Self-pay | Admitting: Family Medicine

## 2016-06-28 NOTE — Telephone Encounter (Signed)
Pt called about a refill that will be coming through the fax

## 2016-06-29 ENCOUNTER — Telehealth: Payer: Self-pay | Admitting: Family Medicine

## 2016-06-29 MED ORDER — LOSARTAN POTASSIUM 100 MG PO TABS: ORAL_TABLET | ORAL | 1 refills | Status: DC

## 2016-06-29 MED ORDER — AMLODIPINE BESYLATE 10 MG PO TABS: ORAL_TABLET | ORAL | 3 refills | Status: DC

## 2016-06-29 MED ORDER — TAMSULOSIN HCL 0.4 MG PO CAPS: 0.4000 mg | ORAL_CAPSULE | Freq: Two times a day (BID) | ORAL | 3 refills | Status: DC

## 2016-06-29 NOTE — Telephone Encounter (Signed)
Patient states that he needs refills on medications. Refills sent to pharmacy

## 2016-07-04 ENCOUNTER — Telehealth: Payer: Self-pay | Admitting: Family Medicine

## 2016-07-04 NOTE — Telephone Encounter (Signed)
Patient aware that omeprazole can be purchased OTC

## 2016-07-19 ENCOUNTER — Other Ambulatory Visit: Payer: Self-pay | Admitting: *Deleted

## 2016-07-19 ENCOUNTER — Other Ambulatory Visit: Payer: Self-pay | Admitting: Family Medicine

## 2016-07-19 MED ORDER — LOSARTAN POTASSIUM 100 MG PO TABS: ORAL_TABLET | ORAL | 1 refills | Status: DC

## 2016-08-04 ENCOUNTER — Other Ambulatory Visit: Payer: Self-pay | Admitting: Family Medicine

## 2016-08-10 ENCOUNTER — Telehealth: Payer: Self-pay | Admitting: *Deleted

## 2016-08-10 ENCOUNTER — Encounter: Payer: Self-pay | Admitting: Family Medicine

## 2016-08-10 ENCOUNTER — Ambulatory Visit (INDEPENDENT_AMBULATORY_CARE_PROVIDER_SITE_OTHER): Payer: Medicare Other | Admitting: Family Medicine

## 2016-08-10 VITALS — BP 127/75 | HR 79 | Temp 97.9°F | Ht 65.0 in | Wt 201.0 lb

## 2016-08-10 DIAGNOSIS — Z23 Encounter for immunization: Secondary | ICD-10-CM | POA: Diagnosis not present

## 2016-08-10 DIAGNOSIS — M545 Low back pain, unspecified: Secondary | ICD-10-CM

## 2016-08-10 DIAGNOSIS — R413 Other amnesia: Secondary | ICD-10-CM

## 2016-08-10 DIAGNOSIS — J45991 Cough variant asthma: Secondary | ICD-10-CM | POA: Diagnosis not present

## 2016-08-10 DIAGNOSIS — E785 Hyperlipidemia, unspecified: Secondary | ICD-10-CM | POA: Diagnosis not present

## 2016-08-10 MED ORDER — DONEPEZIL HCL 5 MG PO TABS: 5.0000 mg | ORAL_TABLET | Freq: Every day | ORAL | 0 refills | Status: DC

## 2016-08-10 NOTE — Patient Instructions (Signed)
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 Start the Aricept 5 mg once daily and return to the office in 4 weeks and we will increase this to 10 mg daily after getting on 10 mg daily we will add Namenda to this medication and all of this will be an 1 by mouth. Flu shot that you received today may make your arm sore Continue with aggressive therapeutic lifestyle changes and weight loss as much as possible

## 2016-08-10 NOTE — Telephone Encounter (Signed)
I discussed the issue with Dr. Laurance Flatten, the patient has developed some memory problems, may have some issues previously with ADD. We will get the patient in or an evaluation. I have referred his name to our new patient coordinator.

## 2016-08-10 NOTE — Progress Notes (Signed)
Subjective:    Patient ID: Ricky [name], male    DOB: November 29, 1940, 75 y.o.   MRN: [id number]  HPI Patient here today for memory issues. He states that his memory is getting worse. The patient is pleasant and alert. He describes issues with his memory especially recently in counting some money that he had that he was going to deposit and that he could never get a cannulated correctly and he was very concerned about this and help him and we had to get his wife to The money so it could be deposited. He says he has short-term memory loss and forgets things that have happened recently. He is especially bad with remembering names. I have known this patient for years and I do think there is a large component of attention deficit disorder with this patient. He immediately shook his head and agreed to that. He seems to wander a lot with his thought processes. He denies any chest pain. He denies any problems with shortness of breath other than periodic coughing and he is been diagnosed in the past with cough very and asthma by the pulmonologist. He has no problems with his intestinal tract including nausea vomiting diarrhea blood in the stool or black tarry bowel movements. He is followed by the urologist regularly for his BPH and his father had a history of prostate cancer.    Patient Active Problem List   Diagnosis Date Noted  . Cough variant asthma vs UACS  11/12/2015  . Hyperlipidemia 10/15/2015  . Vitamin D deficiency 03/12/2014  . BPH (benign prostatic hyperplasia) 10/01/2013  . Erectile dysfunction 10/01/2013  . PERSONAL HX COLONIC POLYPS 01/25/2010  . 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 Essential hypertension 01/13/2009   Outpatient Encounter Prescriptions as of 08/10/2016  Medication Sig  . amLODipine (NORVASC) 10 MG tablet TAKE 1 BY MOUTH DAILY  . Cholecalciferol (VITAMIN D PO) Take 1 capsule by mouth daily.   Marland Kitchen doxazosin (CARDURA) 2 MG tablet Take 1 tablet (2 mg total) by mouth  daily.  Marland Kitchen FIBER FORMULA PO Take 3 capsules by mouth 2 (two) times daily.  . fluticasone (FLONASE) 50 MCG/ACT nasal spray One to 2 sprays each nostril at bedtime and stay on this medication for that at least the next 4-6 weeks  . losartan (COZAAR) 100 MG tablet TAKE 1 BY MOUTH DAILY  . potassium chloride SA (K-DUR,KLOR-CON) 20 MEQ tablet TAKE 1 TABLET BY MOUTH 3 TIMES DAILY  . tamsulosin (FLOMAX) 0.4 MG CAPS capsule Take 1 capsule (0.4 mg total) by mouth 2 (two) times daily.   No facility-administered encounter medications on file as of 08/10/2016.      Review of Systems  Constitutional: Negative.   HENT: Negative.   Eyes: Negative.   Respiratory: Negative.   Cardiovascular: Negative.   Gastrointestinal: Negative.   Endocrine: Negative.   Genitourinary: Negative.   Musculoskeletal: Negative.   Skin: Negative.   Allergic/Immunologic: Negative.   Neurological: Negative.   Hematological: Negative.   Psychiatric/Behavioral: Negative.        Memory issues, worries a lot       Objective:   Physical Exam  Constitutional: He is oriented to person, place, and time. He appears well-developed and well-nourished. No distress.  The patient is pleasant and alert.  HENT:  Head: Normocephalic and atraumatic.  Right Ear: External ear normal.  Left Ear: External ear normal.  Nose: Nose normal.  Mouth/Throat: Oropharynx is clear and moist. No oropharyngeal exudate.  Eyes: Conjunctivae and  EOM are normal. Pupils are equal, round, and reactive to light. Right eye exhibits no discharge. Left eye exhibits no discharge. No scleral icterus.  Neck: Normal range of motion. Neck supple. No thyromegaly present.  No bruits or thyromegaly  Cardiovascular: Normal rate, regular rhythm, normal heart sounds and intact distal pulses.   No murmur heard. The heart is regular at 72/m  Pulmonary/Chest: Effort normal and breath sounds normal. No respiratory distress. He has no wheezes. He has no rales.  Clear  anteriorly and posteriorly  Abdominal: Soft. Bowel sounds are normal. He exhibits no mass. There is no tenderness. There is no rebound and no guarding.  Genitourinary:  Genitourinary Comments: The patient has a regular follow-up with his urologist because of his increased PSA.  Musculoskeletal: Normal range of motion. He exhibits no edema.  Lymphadenopathy:    He has no cervical adenopathy.  Neurological: He is alert and oriented to person, place, and time. He has normal reflexes. No cranial nerve deficit.  Skin: Skin is warm and dry. No rash noted.  Psychiatric: He has a normal mood and affect. His behavior is normal. Judgment and thought content normal.  Nursing note and vitals reviewed.   BP 127/75 (BP Location: Left Arm)   Pulse 79   Temp 97.9 F (36.6 C) (Oral)   Ht 5\' 5"  (1.651 m)   Wt 201 lb (91.2 kg)   BMI 33.45 kg/m        Assessment & Plan:  1. Bilateral low back pain without sciatica, unspecified chronicity -Continue taking Tylenol as needed for pain and avoid NSAIDs as these can raise the blood pressure in your take some  2. Memory impairment -Start Aricept 5 mg once daily and return to the office in 4 weeks to increase to 10 mg  3. Cough variant asthma vs UACS  -If the cough persists, refer back to pulmonology  4. Hyperlipidemia with target LDL less than 100 -Continue with aggressive therapeutic lifestyle changes  No orders of the defined types were placed in this encounter.  Patient Instructions  We will talk with the neurologist and find out the best route to go to further evaluate the possibility of attention deficit disorder in adults. Start the Aricept 5 mg once daily and return to the office in 4 weeks and we will increase this to 10 mg daily after getting on 10 mg daily we will add Namenda to this medication and all of this will be an 1 by mouth. Flu shot that you received today may make your arm sore Continue with aggressive therapeutic lifestyle  changes and weight loss as much as possible   Arrie Senate MD

## 2016-08-11 LAB — CBC WITH DIFFERENTIAL/PLATELET
Basophils Absolute: 0 10*3/uL (ref 0.0–0.2)
Basos: 0 %
EOS (ABSOLUTE): 0.1 10*3/uL (ref 0.0–0.4)
EOS: 2 %
HEMATOCRIT: 43.4 % (ref 37.5–51.0)
Hemoglobin: 14.8 g/dL (ref 12.6–17.7)
Immature Grans (Abs): 0 10*3/uL (ref 0.0–0.1)
Immature Granulocytes: 0 %
LYMPHS ABS: 1.2 10*3/uL (ref 0.7–3.1)
Lymphs: 26 %
MCH: 29.2 pg (ref 26.6–33.0)
MCHC: 34.1 g/dL (ref 31.5–35.7)
MCV: 86 fL (ref 79–97)
MONOS ABS: 0.6 10*3/uL (ref 0.1–0.9)
Monocytes: 13 %
NEUTROS ABS: 2.6 10*3/uL (ref 1.4–7.0)
Neutrophils: 59 %
Platelets: 184 10*3/uL (ref 150–379)
RBC: 5.07 x10E6/uL (ref 4.14–5.80)
RDW: 14 % (ref 12.3–15.4)
WBC: 4.5 10*3/uL (ref 3.4–10.8)

## 2016-08-11 LAB — THYROID PANEL WITH TSH
Free Thyroxine Index: 2 (ref 1.2–4.9)
T3 UPTAKE RATIO: 27 % (ref 24–39)
T4 TOTAL: 7.5 ug/dL (ref 4.5–12.0)
TSH: 1.19 u[IU]/mL (ref 0.450–4.500)

## 2016-08-11 LAB — BMP8+EGFR
BUN / CREAT RATIO: 14 (ref 10–24)
BUN: 17 mg/dL (ref 8–27)
CO2: 23 mmol/L (ref 18–29)
CREATININE: 1.21 mg/dL (ref 0.76–1.27)
Calcium: 9.5 mg/dL (ref 8.6–10.2)
Chloride: 103 mmol/L (ref 96–106)
GFR calc Af Amer: 67 mL/min/{1.73_m2} (ref >59)
GFR calc non Af Amer: 58 mL/min/{1.73_m2} — ABNORMAL LOW (ref >59)
GLUCOSE: 80 mg/dL (ref 65–99)
Potassium: 3.8 mmol/L (ref 3.5–5.2)
SODIUM: 145 mmol/L — AB (ref 134–144)

## 2016-08-11 LAB — VITAMIN B12: VITAMIN B 12: 450 pg/mL (ref 211–946)

## 2016-08-11 LAB — HEPATIC FUNCTION PANEL
ALBUMIN: 4.2 g/dL (ref 3.5–4.8)
ALT: 18 IU/L (ref 0–44)
AST: 18 IU/L (ref 0–40)
Alkaline Phosphatase: 91 IU/L (ref 39–117)
Bilirubin Total: 0.5 mg/dL (ref 0.0–1.2)
Bilirubin, Direct: 0.16 mg/dL (ref 0.00–0.40)
Total Protein: 6.3 g/dL (ref 6.0–8.5)

## 2016-08-18 ENCOUNTER — Ambulatory Visit (INDEPENDENT_AMBULATORY_CARE_PROVIDER_SITE_OTHER): Payer: Medicare Other

## 2016-08-18 ENCOUNTER — Encounter: Payer: Self-pay | Admitting: Family Medicine

## 2016-08-18 ENCOUNTER — Ambulatory Visit (INDEPENDENT_AMBULATORY_CARE_PROVIDER_SITE_OTHER): Payer: Medicare Other | Admitting: Family Medicine

## 2016-08-18 VITALS — BP 153/87 | HR 63 | Temp 97.0°F | Ht 65.0 in | Wt 204.0 lb

## 2016-08-18 DIAGNOSIS — M79645 Pain in left finger(s): Secondary | ICD-10-CM
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
## 2016-08-28 ENCOUNTER — Telehealth: Payer: Self-pay | Admitting: Family Medicine

## 2016-08-28 ENCOUNTER — Encounter: Payer: Self-pay | Admitting: Nurse Practitioner

## 2016-08-28 ENCOUNTER — Ambulatory Visit (INDEPENDENT_AMBULATORY_CARE_PROVIDER_SITE_OTHER): Payer: Medicare Other | Admitting: Nurse Practitioner

## 2016-08-28 ENCOUNTER — Ambulatory Visit: Payer: Medicare Other | Admitting: Nurse Practitioner

## 2016-08-28 VITALS — BP 149/87 | HR 58 | Temp 97.4°F | Ht 65.0 in | Wt 206.0 lb

## 2016-08-28 DIAGNOSIS — J209 Acute bronchitis, unspecified: Secondary | ICD-10-CM | POA: Diagnosis not present

## 2016-08-28 MED ORDER — HYDROCODONE-HOMATROPINE 5-1.5 MG/5ML PO SYRP: 5.0000 mL | ORAL_SOLUTION | Freq: Four times a day (QID) | ORAL | 0 refills | Status: DC | PRN

## 2016-08-28 MED ORDER — ALBUTEROL SULFATE HFA 108 (90 BASE) MCG/ACT IN AERS: 2.0000 | INHALATION_SPRAY | Freq: Four times a day (QID) | RESPIRATORY_TRACT | 0 refills | Status: DC | PRN

## 2016-08-28 MED ORDER — AZITHROMYCIN 250 MG PO TABS: ORAL_TABLET | ORAL | 0 refills | Status: DC

## 2016-08-28 NOTE — Patient Instructions (Signed)

## 2016-08-28 NOTE — Telephone Encounter (Signed)
Patient aware of upcoming appointments with Dr. Laurance Flatten.

## 2016-08-28 NOTE — Addendum Note (Signed)
Addended by: Chevis Pretty on: 08/28/2016 03:10 PM   Modules accepted: Orders

## 2016-08-28 NOTE — Progress Notes (Addendum)
Subjective:     Rhian Yearta is a 75 y.o. male who presents for evaluation of sinus pain. Symptoms include: clear rhinorrhea, congestion, cough, headaches, nasal congestion and sore throat. Onset of symptoms was 3 days ago. Symptoms have been gradually worsening since that time. Past history is significant for occasional episodes of bronchitis. Patient is a non-smoker.  The following portions of the patient's history were reviewed and updated as appropriate: allergies, current medications, past family history, past medical history, past social history, past surgical history and problem list.  Review of Systems Pertinent items noted in HPI and remainder of comprehensive ROS otherwise negative.   Objective:    BP (!) 149/87   Pulse (!) 58   Temp 97.4 F (36.3 C) (Oral)   Ht 5\' 5"  (1.651 m)   Wt 206 lb (93.4 kg)   BMI 34.28 kg/m  General appearance: alert and cooperative Eyes: conjunctivae/corneas clear. PERRL, EOM's intact. Fundi benign. Ears: normal TM's and external ear canals both ears Nose: clear discharge, moderate congestion, turbinates red, no sinus tenderness Throat: lips, mucosa, and tongue normal; teeth and gums normal Neck: no adenopathy, no carotid bruit, no JVD, supple, symmetrical, trachea midline and thyroid not enlarged, symmetric, no tenderness/mass/nodules Lungs: clear to auscultation bilaterally and deep wet cough Heart: regular rate and rhythm, S1, S2 normal, no murmur, click, rub or gallop    Assessment:    Acute bacterial bronchitis.    Plan:   1. Take meds as prescribed 2. Use a cool mist humidifier especially during the winter months and when heat has been humid. 3. Use saline nose sprays frequently 4. Saline irrigations of the nose can be very helpful if done frequently.  * 4X daily for 1 week*  * Use of a nettie pot can be helpful with this. Follow directions with this* 5. Drink plenty of fluids 6. Keep thermostat turn down low 7.For any cough or  congestion  Use plain Mucinex- regular strength or max strength is fine   * Children- consult with Pharmacist for dosing 8. For fever or aces or pains- take tylenol or ibuprofen appropriate for age and weight.  * for fevers greater than 101 orally you may alternate ibuprofen and tylenol every  3 hours.   Meds ordered this encounter  Medications  . VESICARE 10 MG tablet    Sig: TK 1 T PO QD    Refill:  0  . azithromycin (ZITHROMAX) 250 MG tablet    Sig: Two tablets day one, then one tablet daily next 4 days.    Dispense:  6 tablet    Refill:  0    Order Specific Question:   Supervising Provider    Answer:   VINCENT, CAROL L [[id number]]  . HYDROcodone-homatropine (HYCODAN) 5-1.5 MG/5ML syrup    Sig: Take 5 mLs by mouth every 6 (six) hours as needed for cough.    Dispense:  120 mL    Refill:  0    Order Specific Question:   Supervising Provider    Answer:   VINCENT, CAROL L [[id number]]  . albuterol (PROVENTIL HFA;VENTOLIN HFA) 108 (90 Base) MCG/ACT inhaler    Sig: Inhale 2 puffs into the lungs every 6 (six) hours as needed for wheezing or shortness of breath.    Dispense:  1 Inhaler    Refill:  0    Order Specific Question:   Supervising Provider    Answer:   Eustaquio Maize Layton, FNP

## 2016-09-05 ENCOUNTER — Telehealth: Payer: Self-pay | Admitting: Family Medicine

## 2016-09-05 NOTE — Telephone Encounter (Signed)
Pt needed to cancel his appt on the 30th as he had to get on a plane.

## 2016-09-07 ENCOUNTER — Ambulatory Visit: Payer: Medicare Other | Admitting: Family Medicine

## 2016-09-13 DIAGNOSIS — R972 Elevated prostate specific antigen [PSA]: Secondary | ICD-10-CM | POA: Diagnosis not present

## 2016-09-20 DIAGNOSIS — R972 Elevated prostate specific antigen [PSA]: Secondary | ICD-10-CM | POA: Diagnosis not present

## 2016-09-20 DIAGNOSIS — R351 Nocturia: Secondary | ICD-10-CM | POA: Diagnosis not present

## 2016-09-20 DIAGNOSIS — R3915 Urgency of urination: Secondary | ICD-10-CM | POA: Diagnosis not present

## 2016-09-20 DIAGNOSIS — N401 Enlarged prostate with lower urinary tract symptoms: Secondary | ICD-10-CM | POA: Diagnosis not present

## 2016-09-21 ENCOUNTER — Encounter: Payer: Self-pay | Admitting: Family Medicine

## 2016-09-21 ENCOUNTER — Ambulatory Visit (INDEPENDENT_AMBULATORY_CARE_PROVIDER_SITE_OTHER): Payer: Medicare Other | Admitting: Family Medicine

## 2016-09-21 VITALS — BP 128/78 | HR 78 | Temp 97.0°F | Ht 65.0 in | Wt 208.0 lb

## 2016-09-21 DIAGNOSIS — E785 Hyperlipidemia, unspecified: Secondary | ICD-10-CM

## 2016-09-21 DIAGNOSIS — E559 Vitamin D deficiency, unspecified: Secondary | ICD-10-CM

## 2016-09-21 DIAGNOSIS — N400 Enlarged prostate without lower urinary tract symptoms: Secondary | ICD-10-CM | POA: Diagnosis not present

## 2016-09-21 DIAGNOSIS — F988 Other specified behavioral and emotional disorders with onset usually occurring in childhood and adolescence: Secondary | ICD-10-CM | POA: Diagnosis not present

## 2016-09-21 DIAGNOSIS — R972 Elevated prostate specific antigen [PSA]: Secondary | ICD-10-CM | POA: Diagnosis not present

## 2016-09-21 DIAGNOSIS — R413 Other amnesia: Secondary | ICD-10-CM | POA: Diagnosis not present

## 2016-09-21 DIAGNOSIS — N393 Stress incontinence (female) (male): Secondary | ICD-10-CM

## 2016-09-21 DIAGNOSIS — J45991 Cough variant asthma: Secondary | ICD-10-CM | POA: Diagnosis not present

## 2016-09-21 DIAGNOSIS — I10 Essential (primary) hypertension: Secondary | ICD-10-CM

## 2016-09-21 LAB — LIPID PANEL
CHOLESTEROL TOTAL: 168 mg/dL (ref 100–199)
Chol/HDL Ratio: 3.7 ratio units (ref 0.0–5.0)
HDL: 45 mg/dL (ref >39)
LDL Calculated: 106 mg/dL — ABNORMAL HIGH (ref 0–99)
Triglycerides: 84 mg/dL (ref 0–149)
VLDL CHOLESTEROL CAL: 17 mg/dL (ref 5–40)

## 2016-09-21 NOTE — Progress Notes (Signed)
Subjective:    Patient ID: Ricky [name], male    DOB: Feb 12, 1941, 75 y.o.   MRN: [id number]  HPI  Pt here for follow up and management of chronic medical problems which includes hyperlipidemia and hypertension. He is taking medications regularly.The patient did well today with no specific complaints. He has a history of hypertension and BPH. He sees the urologist regularly. He has had a cholesterol panel and we will review this with him during the visit today. He also has an appointment with the neurologist for tomorrow. Dr. Jannifer Franklin already has a copy of these blood work results. The purpose of the visit to see the neurologist was for the possibility of attention deficit disorder) adult. The patient also has some problems with memory impairment and we have started Aricept 5 mg and will increase this to 10 mg if he is doing okay with the 5 mg. In talking to him he asked to discontinue the 5 mg of Aricept because he was having side effects which I think may been according to the patient some constipation or loose bowel movements. So he is currently not taking the Aricept. He does complain of some frontal headaches at times and this is the first time his talk to me about that. He denies any chest pain but does have a history of cough very and asthma and has been seen recently for more cough and congestion. He has seen Dr. Christinia Gully in the past about his cough variant asthma. He denies any trouble with his intestinal track currently although he stop the Aricept his diarrhea/constipation seem to get better. He is also seeing the urologist and has a prostate biopsy scheduled for early January because of an elevated PSA. The patient mentions that on he is had some urinary incontinence and leakage especially at nighttime. He will follow-up on this with the urologist. Reviewed with him today and he will take a copy of this with him when he sees the neurologist tomorrow.    Patient Active Problem List   Diagnosis Date Noted  . Cough variant asthma vs UACS  11/12/2015  . Hyperlipidemia 10/15/2015  . Vitamin D deficiency 03/12/2014  . BPH (benign prostatic hyperplasia) 10/01/2013  . Erectile dysfunction 10/01/2013  . PERSONAL HX COLONIC POLYPS 01/25/2010  . 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 Essential hypertension 01/13/2009   Outpatient Encounter Prescriptions as of 09/21/2016  Medication Sig  . amLODipine (NORVASC) 10 MG tablet TAKE 1 BY MOUTH DAILY  . Cholecalciferol (VITAMIN D PO) Take 1 capsule by mouth daily.   Marland Kitchen doxazosin (CARDURA) 2 MG tablet Take 1 tablet (2 mg total) by mouth daily.  Marland Kitchen FIBER FORMULA PO Take 3 capsules by mouth 2 (two) times daily.  Marland Kitchen losartan (COZAAR) 100 MG tablet TAKE 1 BY MOUTH DAILY  . potassium chloride SA (K-DUR,KLOR-CON) 20 MEQ tablet TAKE 1 TABLET BY MOUTH 3 TIMES DAILY  . tamsulosin (FLOMAX) 0.4 MG CAPS capsule Take 1 capsule (0.4 mg total) by mouth 2 (two) times daily.  . VESICARE 10 MG tablet TK 1 T PO QD  . fluticasone (FLONASE) 50 MCG/ACT nasal spray One to 2 sprays each nostril at bedtime and stay on this medication for that at least the next 4-6 weeks (Patient not taking: Reported on 09/21/2016)  . [DISCONTINUED] albuterol (PROVENTIL HFA;VENTOLIN HFA) 108 (90 Base) MCG/ACT inhaler Inhale 2 puffs into the lungs every 6 (six) hours as needed for wheezing or shortness of breath.  . [DISCONTINUED] azithromycin (ZITHROMAX) 250  MG tablet Two tablets day one, then one tablet daily next 4 days.  . [DISCONTINUED] donepezil (ARICEPT) 5 MG tablet Take 1 tablet (5 mg total) by mouth at bedtime.  . [DISCONTINUED] HYDROcodone-homatropine (HYCODAN) 5-1.5 MG/5ML syrup Take 5 mLs by mouth every 6 (six) hours as needed for cough.   No facility-administered encounter medications on file as of 09/21/2016.      Review of Systems  Constitutional: Negative.   HENT: Negative.   Eyes: Negative.   Respiratory: Negative.   Cardiovascular: Negative.     Gastrointestinal: Negative.   Endocrine: Negative.   Genitourinary: Negative.   Musculoskeletal: Negative.   Skin: Negative.   Allergic/Immunologic: Negative.   Neurological: Negative.   Hematological: Negative.   Psychiatric/Behavioral: Negative.        Objective:   Physical Exam  Constitutional: He is oriented to person, place, and time. He appears well-developed and well-nourished. No distress.  HENT:  Head: Normocephalic and atraumatic.  Right Ear: External ear normal.  Left Ear: External ear normal.  Mouth/Throat: Oropharynx is clear and moist. No oropharyngeal exudate.  Nasal turbinate congestion bilaterally  Eyes: Conjunctivae and EOM are normal. Pupils are equal, round, and reactive to light. Right eye exhibits no discharge. Left eye exhibits no discharge. No scleral icterus.  Neck: Normal range of motion. Neck supple. No thyromegaly present.  no bruits thyromegaly or anterior cervical adenopathy  Cardiovascular: Normal rate, regular rhythm, normal heart sounds and intact distal pulses.   No murmur heard. Heart has a regular rate and rhythm at 72/m  Pulmonary/Chest: Effort normal and breath sounds normal. No respiratory distress. He has no wheezes. He has no rales. He exhibits no tenderness.  Dry cough. No wheezes or increased congestion. No axillary adenopathy.  Abdominal: Soft. Bowel sounds are normal. He exhibits no mass. There is no tenderness. There is no rebound and no guarding.  Genitourinary:  Genitourinary Comments: Follow-up with urology as planned  Musculoskeletal: Normal range of motion. He exhibits no edema.  Lymphadenopathy:    He has no cervical adenopathy.  Neurological: He is alert and oriented to person, place, and time. He has normal reflexes. No cranial nerve deficit.  There is some concern with the patient about his memory. I do think after knowing this patient for many years that he may have a component of adult ADD and will have the neurologist to  further evaluate this.  Skin: Skin is warm and dry. No rash noted.  Psychiatric: He has a normal mood and affect. His behavior is normal. Judgment and thought content normal.  Nursing note and vitals reviewed.   BP 128/78 (BP Location: Left Arm)   Pulse 78   Temp 97 F (36.1 C) (Oral)   Ht 5\' 5"  (1.651 m)   Wt 208 lb (94.3 kg)   BMI 34.61 kg/m        Assessment & Plan:  1. Hyperlipidemia with target LDL less than 100 -A lipid profile is pending from the visit today.  2. Vitamin D deficiency -Continue current treatment  3. Essential hypertension -The blood pressure is good today and he will continue with current treatment  4. Benign prostatic hyperplasia, unspecified whether lower urinary tract symptoms present -Follow-up with urology as planned and especially for prostate biopsy because of elevated PSA  5. Cough variant asthma vs UACS  -If the patient uses cool mist humidification and reduces the use of the overhead fan and his cough does not get better he will need a return visit with  his pulmonologist.  6. 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 Stress incontinence of urine -Follow-up with urology as planned  9. Elevated PSA -Follow-up with urology as planned for biopsy  Patient Instructions                       Medicare Annual Wellness Visit  [facility] and the medical providers at [facility] strive to bring you the best medical care.  In doing so we not only want to address your current medical conditions and concerns but also to detect new conditions early and prevent illness, disease and health-related problems.    Medicare offers a yearly Wellness Visit which allows our clinical staff to assess your need for preventative services including immunizations, lifestyle education, counseling to decrease risk of preventable diseases and  screening for fall risk and other medical concerns.    This visit is provided free of charge (no copay) for all Medicare recipients. The clinical pharmacists at [facility] have begun to conduct these Wellness Visits which will also include a thorough review of all your medications.    As you primary medical provider recommend that you make an appointment for your Annual Wellness Visit if you have not done so already this year.  You may set up this appointment before you leave today or you may call back [phone]) and schedule an appointment.  Please make sure when you call that you mention that you are scheduling your Annual Wellness Visit with the clinical pharmacist so that the appointment may be made for the proper length of time.     Continue current medications. Continue good therapeutic lifestyle changes which include good diet and exercise. Fall precautions discussed with patient. If an FOBT was given today- please return it to our front desk. If you are over 67 years old - you may need Prevnar 52 or the adult Pneumonia vaccine.  **Flu shots are available--- please call and schedule a FLU-CLINIC appointment**  After your visit with [place] today you will receive a survey in the mail or online from [employer] regarding your care with [place]. Please take a moment to fill this out. Your feedback is very important to [place] as you can help [place] better understand your patient needs as well as improve your experience and satisfaction. WE CARE ABOUT YOU!!!   The patient should follow up with his urologist as planned for the biopsy because of the elevated PSA He should keep his appointment with the neurologist tomorrow because of the evaluation for memory impairment and possible adult ADD. He also has headaches which I think may be secondary to allergic rhinitis and he should get back on his Flonase and take one spray each nostril daily at bedtime He should keep the house as cool as  possible, avoid the use of overhead fans, and use cool mist humidification in his house and especially in the room where his with heater is located. He should take Mucinex plain, blue and white in color twice daily with a large glass of water and as mentioned above restart his Flonase 1 spray each nostril at bedtime  Arrie Senate MD

## 2016-09-21 NOTE — Addendum Note (Signed)
Addended by: Zannie Cove on: 09/21/2016 08:40 AM   Modules accepted: Orders

## 2016-09-21 NOTE — Patient Instructions (Addendum)
Medicare Annual Wellness Visit  [facility] and the medical providers at [facility] strive to bring you the best medical care.  In doing so we not only want to address your current medical conditions and concerns but also to detect new conditions early and prevent illness, disease and health-related problems.    Medicare offers a yearly Wellness Visit which allows our clinical staff to assess your need for preventative services including immunizations, lifestyle education, counseling to decrease risk of preventable diseases and screening for fall risk and other medical concerns.    This visit is provided free of charge (no copay) for all Medicare recipients. The clinical pharmacists at [facility] have begun to conduct these Wellness Visits which will also include a thorough review of all your medications.    As you primary medical provider recommend that you make an appointment for your Annual Wellness Visit if you have not done so already this year.  You may set up this appointment before you leave today or you may call back [id number]) and schedule an appointment.  Please make sure when you call that you mention that you are scheduling your Annual Wellness Visit with the clinical pharmacist so that the appointment may be made for the proper length of time.     Continue current medications. Continue good therapeutic lifestyle changes which include good diet and exercise. Fall precautions discussed with patient. If an FOBT was given today- please return it to our front desk. If you are over 56 years old - you may need Prevnar 44 or the adult Pneumonia vaccine.  **Flu shots are available--- please call and schedule a FLU-CLINIC appointment**  After your visit with [place] today you will receive a survey in the mail or online from [employer] regarding your care with [place]. Please take a moment to fill this out. Your feedback is very  important to [place] as you can help [place] better understand your patient needs as well as improve your experience and satisfaction. WE CARE ABOUT YOU!!!   The patient should follow up with his urologist as planned for the biopsy because of the elevated PSA He should keep his appointment with the neurologist tomorrow because of the evaluation for memory impairment and possible adult ADD. He also has headaches which I think may be secondary to allergic rhinitis and he should get back on his Flonase and take one spray each nostril daily at bedtime He should keep the house as cool as possible, avoid the use of overhead fans, and use cool mist humidification in his house and especially in the room where his with heater is located. He should take Mucinex plain, blue and white in color twice daily with a large glass of water and as mentioned above restart his Flonase 1 spray each nostril at bedtime

## 2016-09-22 ENCOUNTER — Ambulatory Visit (INDEPENDENT_AMBULATORY_CARE_PROVIDER_SITE_OTHER): Payer: Medicare Other | Admitting: Neurology

## 2016-09-22 ENCOUNTER — Encounter: Payer: Self-pay | Admitting: Neurology

## 2016-09-22 VITALS — BP 141/88 | HR 68 | Ht 67.0 in | Wt 203.5 lb

## 2016-09-22 DIAGNOSIS — R413 Other amnesia: Secondary | ICD-10-CM | POA: Diagnosis not present

## 2016-09-22 DIAGNOSIS — G4489 Other headache syndrome: Secondary | ICD-10-CM | POA: Diagnosis not present

## 2016-09-22 MED ORDER — ALPRAZOLAM 0.5 MG PO TABS: ORAL_TABLET | ORAL | 0 refills | Status: DC

## 2016-09-22 NOTE — Patient Instructions (Signed)
   We will check blood work today and get MRI of the brain. 

## 2016-09-22 NOTE — Progress Notes (Signed)
Reason for visit: Memory disorder, headache  Referring physician: Dr. Shon Hough is a 75 y.o. male  History of present illness:  Ricky [name] is a 75 year old right-handed white male with a history of lifelong issues with a learning disorder. The patient apparently went through high school and into college without knowing how to read. The patient entered the [facility] [employer], and he taught himself how to read at that time and then went back to college and completed his college education. The patient has required taking notes throughout his life to keep up with issues, but he has been relatively successful in business. The patient has begun having some new cognitive issues over the last year or so according to the wife. The patient himself has noted a change is over the last 2 months. The patient has noted some problems with short-term memory, he will try to remember a few items and then within several minutes he has to go back and check again. The patient is running a small business at [employer], he feels somewhat overwhelmed with this issue. The patient has developed headaches over the last 2-3 months that are mainly bifrontal in nature, but may also involve the back of the head and neck. The patient reports no issues with sleep, he rests well at night. At times he may have some difficulty with directions with driving. He is able to keep up with finances but he has trouble learning new software on the computer. The patient denies a significant issues with balance but he has had some alteration in balance following total knee replacements bilaterally. The patient has had some urinary urgency and incontinence, he has had an elevation of PSA level and will be going for a prostate biopsy in the near future. The patient had been placed on Aricept, but he was only on the medication for a week or 2 before he stopped it. He does not believe he was having any side effects on the medication. The patient comes to  this office for further evaluation.  Past Medical History:  Diagnosis Date  . Allergy   . BPH (benign prostatic hyperplasia)   . Cataract   . Chronic cough   . Colon polyps   . Hypertension     Past Surgical History:  Procedure Laterality Date  . COLONOSCOPY  2011  . EYE SURGERY Right    cataracts  . EYE SURGERY Left    cataracts  . HAND SURGERY Left 1992  . JOINT REPLACEMENT Bilateral 2010   bilat knees  . KNEE ARTHROSCOPY Bilateral 1960's and 70's  . ROTATOR CUFF REPAIR Right 2004  . TOE SURGERY Left 1970   little toe  . VASECTOMY      Family History  Problem Relation Age of Onset  . Healthy Mother   . Cancer Father     prostate  . Cancer Brother   . Alcohol abuse Brother   . Bipolar disorder Brother   . Colon cancer Neg Hx   . Esophageal cancer Neg Hx   . Rectal cancer Neg Hx   . Stomach cancer Neg Hx     Social history:  reports that he has never smoked. He has never used smokeless tobacco. He reports that he drinks alcohol. He reports that he does not use drugs.  Medications:  Prior to Admission medications   Medication Sig Start Date End Date Taking? Authorizing Provider  amLODipine (NORVASC) 10 MG tablet TAKE 1 BY MOUTH DAILY 06/29/16  Yes Chipper Herb, MD  Cholecalciferol (VITAMIN D PO) Take 1 capsule by mouth daily.    Yes Historical Provider, MD  doxazosin (CARDURA) 2 MG tablet Take 1 tablet (2 mg total) by mouth daily. 05/22/16  Yes Chipper Herb, MD  FIBER FORMULA PO Take 3 capsules by mouth 2 (two) times daily.   Yes Historical Provider, MD  fluticasone (FLONASE) 50 MCG/ACT nasal spray One to 2 sprays each nostril at bedtime and stay on this medication for that at least the next 4-6 weeks 09/29/15  Yes Chipper Herb, MD  losartan (COZAAR) 100 MG tablet TAKE 1 BY MOUTH DAILY 07/19/16  Yes Chipper Herb, MD  potassium chloride SA (K-DUR,KLOR-CON) 20 MEQ tablet TAKE 1 TABLET BY MOUTH 3 TIMES DAILY 08/04/16  Yes Chipper Herb, MD  tamsulosin  (FLOMAX) 0.4 MG CAPS capsule Take 1 capsule (0.4 mg total) by mouth 2 (two) times daily. 06/29/16  Yes Chipper Herb, MD     No Known Allergies  ROS:  Out of a complete 14 system review of symptoms, the patient complains only of the following symptoms, and all other reviewed systems are negative.  Hearing loss, ringing in the ears Cough Incontinence of the bladder Feeling cold Memory loss, headache Restless legs  Blood pressure (!) 141/88, pulse 68, height 5\' 7"  (1.702 m), weight 203 lb 8 oz (92.3 kg).  Physical Exam  General: The patient is alert and cooperative at the time of the examination.  Eyes: Pupils are equal, round, and reactive to light. Discs are flat bilaterally.  Neck: The neck is supple, no carotid bruits are noted.  Respiratory: The respiratory examination is clear.  Cardiovascular: The cardiovascular examination reveals a regular rate and rhythm, no obvious murmurs or rubs are noted.  Neuromuscular: Range of movement of the cervical spine is relatively full.  Skin: Extremities are without significant edema.  Neurologic Exam  Mental status: The patient is alert and oriented x 3 at the time of the examination. The patient has apparent normal recent and remote memory, with an apparently normal attention span and concentration ability. Mini-Mental Status Examination done today shows a total score 25/30.  Cranial nerves: Facial symmetry is present. There is good sensation of the face to pinprick and soft touch bilaterally. The strength of the facial muscles and the muscles to head turning and shoulder shrug are normal bilaterally. Speech is well enunciated, no aphasia or dysarthria is noted. Extraocular movements are full. Visual fields are full. The tongue is midline, and the patient has symmetric elevation of the soft palate. No obvious hearing deficits are noted.  Motor: The motor testing reveals 5 over 5 strength of all 4 extremities. Good symmetric motor tone  is noted throughout.  Sensory: Sensory testing is intact to pinprick, soft touch, vibration sensation, and position sense on all 4 extremities. No evidence of extinction is noted.  Coordination: Cerebellar testing reveals good finger-nose-finger and heel-to-shin bilaterally.  Gait and station: Gait is normal. Tandem gait is normal. Romberg is negative. No drift is seen.  Reflexes: Deep tendon reflexes are symmetric and normal bilaterally. Toes are downgoing bilaterally.   Assessment/Plan:  1. Memory disturbance  2. Headache  The patient has recently had onset of some issues with short-term memory, difficulty with keeping up with medications and appointments. The patient is feeling overwhelmed with business issues. The patient had been on Aricept, and does not appear that he had any tolerance issues on the medication. The patient will be  sent for a sedimentation rate and C-reactive protein given the onset of headache, MRI of the brain will be done. We may consider restarting the Aricept in the future. He will follow-up in 6 months. The patient does appear to have had a learning disability in the past that was well compensated.  Ricky Alexanders MD 09/22/2016 8:37 AM  [facility] [address] [place], [place] [address]  Phone [phone] Fax [phone]

## 2016-09-23 LAB — SEDIMENTATION RATE: SED RATE: 2 mm/h (ref 0–30)

## 2016-09-23 LAB — C-REACTIVE PROTEIN: CRP: 2.1 mg/L (ref 0.0–4.9)

## 2016-09-26 ENCOUNTER — Telehealth: Payer: Self-pay

## 2016-09-26 NOTE — Telephone Encounter (Signed)
-----   Message from Kathrynn Ducking, MD sent at 09/24/2016  2:38 PM EST -----  The blood work results are unremarkable. Please call the patient.  ----- Message ----- From: Lavone Neri Lab Results In Sent: 09/23/2016   5:40 AM To: Kathrynn Ducking, MD

## 2016-09-26 NOTE — Telephone Encounter (Signed)
Called pt w/ unremarkable lab results. Verbalized understanding and appreciation for call. 

## 2016-10-05 ENCOUNTER — Ambulatory Visit
Admission: RE | Admit: 2016-10-05 | Discharge: 2016-10-05 | Disposition: A | Discharge status: Home / Self Care | Payer: Medicare Other | Source: Ambulatory Visit | Attending: Neurology | Admitting: Neurology

## 2016-10-05 DIAGNOSIS — R413 Other amnesia: Secondary | ICD-10-CM | POA: Diagnosis not present

## 2016-10-05 DIAGNOSIS — G4489 Other headache syndrome: Secondary | ICD-10-CM

## 2016-10-10 ENCOUNTER — Telehealth: Payer: Self-pay | Admitting: Neurology

## 2016-10-10 DIAGNOSIS — R972 Elevated prostate specific antigen [PSA]: Secondary | ICD-10-CM | POA: Diagnosis not present

## 2016-10-10 DIAGNOSIS — N401 Enlarged prostate with lower urinary tract symptoms: Secondary | ICD-10-CM | POA: Diagnosis not present

## 2016-10-10 NOTE — Telephone Encounter (Signed)
  I called the patient. 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 No etiology for the headache is noted. He does not wish to go on any medications for the headache.  MRI brain 10/06/16:  IMPRESSION:  Abnormal MRI brain (without) demonstrating: 1. 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  2. Mild perisylvian atrophy. 3. No acute findings.

## 2016-10-17 DIAGNOSIS — C61 Malignant neoplasm of prostate: Secondary | ICD-10-CM | POA: Diagnosis not present

## 2016-10-18 ENCOUNTER — Encounter: Payer: Self-pay | Admitting: Radiation Oncology

## 2016-11-02 ENCOUNTER — Telehealth: Payer: Self-pay | Admitting: Family Medicine

## 2016-11-02 MED ORDER — DONEPEZIL HCL 10 MG PO TABS: 10.0000 mg | ORAL_TABLET | Freq: Every day | ORAL | 2 refills | Status: DC

## 2016-11-02 MED ORDER — DONEPEZIL HCL 5 MG PO TABS: 5.0000 mg | ORAL_TABLET | Freq: Every day | ORAL | 0 refills | Status: DC

## 2016-11-02 NOTE — Telephone Encounter (Signed)
Please restart Aricept. If he is not been taking 5 mg he should start with this for one month and then go to 10 mg.

## 2016-11-02 NOTE — Telephone Encounter (Signed)
Pt wants to go back on Aricept He wants dose increased to 10mg  Please advise

## 2016-11-02 NOTE — Telephone Encounter (Signed)
Left detailed message for pt 

## 2016-11-03 DIAGNOSIS — D1801 Hemangioma of skin and subcutaneous tissue: Secondary | ICD-10-CM | POA: Diagnosis not present

## 2016-11-03 DIAGNOSIS — L820 Inflamed seborrheic keratosis: Secondary | ICD-10-CM | POA: Diagnosis not present

## 2016-11-03 DIAGNOSIS — L309 Dermatitis, unspecified: Secondary | ICD-10-CM | POA: Diagnosis not present

## 2016-11-03 DIAGNOSIS — L578 Other skin changes due to chronic exposure to nonionizing radiation: Secondary | ICD-10-CM | POA: Diagnosis not present

## 2016-11-03 DIAGNOSIS — L304 Erythema intertrigo: Secondary | ICD-10-CM | POA: Diagnosis not present

## 2016-11-03 DIAGNOSIS — L821 Other seborrheic keratosis: Secondary | ICD-10-CM | POA: Diagnosis not present

## 2016-11-03 DIAGNOSIS — D225 Melanocytic nevi of trunk: Secondary | ICD-10-CM | POA: Diagnosis not present

## 2016-11-03 DIAGNOSIS — L739 Follicular disorder, unspecified: Secondary | ICD-10-CM | POA: Diagnosis not present

## 2016-11-03 DIAGNOSIS — L814 Other melanin hyperpigmentation: Secondary | ICD-10-CM | POA: Diagnosis not present

## 2016-11-13 ENCOUNTER — Encounter: Payer: Self-pay | Admitting: Radiation Oncology

## 2016-11-13 ENCOUNTER — Ambulatory Visit
Admission: RE | Admit: 2016-11-13 | Discharge: 2016-11-13 | Disposition: A | Discharge status: Home / Self Care | Payer: Medicare Other | Source: Ambulatory Visit | Attending: Radiation Oncology | Admitting: Radiation Oncology

## 2016-11-13 ENCOUNTER — Encounter: Payer: Self-pay | Admitting: Medical Oncology

## 2016-11-13 VITALS — BP 171/85 | HR 58 | Resp 18 | Ht 67.0 in | Wt 203.4 lb

## 2016-11-13 DIAGNOSIS — Z8042 Family history of malignant neoplasm of prostate: Secondary | ICD-10-CM | POA: Insufficient documentation

## 2016-11-13 DIAGNOSIS — I10 Essential (primary) hypertension: Secondary | ICD-10-CM | POA: Insufficient documentation

## 2016-11-13 DIAGNOSIS — R413 Other amnesia: Secondary | ICD-10-CM | POA: Diagnosis not present

## 2016-11-13 DIAGNOSIS — C61 Malignant neoplasm of prostate: Secondary | ICD-10-CM | POA: Insufficient documentation

## 2016-11-13 DIAGNOSIS — Z96653 Presence of artificial knee joint, bilateral: Secondary | ICD-10-CM | POA: Diagnosis not present

## 2016-11-13 DIAGNOSIS — Z79899 Other long term (current) drug therapy: Secondary | ICD-10-CM | POA: Diagnosis not present

## 2016-11-13 DIAGNOSIS — Z1503 Genetic susceptibility to malignant neoplasm of prostate: Secondary | ICD-10-CM | POA: Diagnosis not present

## 2016-11-13 HISTORY — DX: Deviated nasal septum: J34.2

## 2016-11-13 HISTORY — DX: Malignant neoplasm of prostate: C61

## 2016-11-13 NOTE — Progress Notes (Signed)
GU Location of Tumor / Histology: prostatic adenocarcinoma  If Prostate Cancer, Gleason Score is (3 + 4) and PSA is (9.02). 78 cc prostate volume.  Ricky [name] was diagnosed with prostate ca 10/10/2016 after a long history of a stable but, elevated PSA. Reports dysuria in the 70s related to an infection which resolved with antibiotics.  Biopsies of prostate (if applicable) revealed:    Past/Anticipated interventions by urology, if any: biopsy, CT of pelvis (negative), referral to Dr. Tammi Klippel to discuss XRT vs. Seeds.  Past/Anticipated interventions by medical oncology, if any: no  Weight changes, if any: no  Bowel/Bladder complaints, if any: IPSS 23. Nocturia x 4. Reports occasional hematuria. Reports leakage. Reports mild intermittent dysuria. States, "to completely empty my bladder I have to pinch and pee." He goes onto explain he holds the end of his penis until "it fills with urine" then release his bladder. Confirms he take Ditropan and Flomax as directed.  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  SAFETY ISSUES:  Prior radiation? no  Pacemaker/ICD? no  Possible current pregnancy? no  Is the patient on methotrexate?   Current Complaints / other details:  76 year old male. NKDA. Married. Father died of prostate ca. Brother with hx of prostate ca treated with hormone therapy. Complains of headaches he believes are related to Aricept. Also, reports dizziness with headaches. Confirm recent MRI was negative.

## 2016-11-13 NOTE — Progress Notes (Signed)
Radiation Oncology         [phone]) [phone] ________________________________  Initial outpatient Consultation  Name: Ricky [name] MRN: [id number]  Date: 11/13/2016  DOB: 04-Oct-1941  WJ:XBJYN, Elenore Rota, MD  Kathie Rhodes, MD   REFERRING PHYSICIAN: Kathie Rhodes, MD  DIAGNOSIS: 76 y.o. gentleman with stage T1c adenocarcinoma of the prostate with a Gleason's score of 3+4 and a PSA of 9.02    ICD-9-CM ICD-10-CM   1. Malignant neoplasm of prostate ([place]) 185 C61   2. Malignant neoplasm prostate ([place]) 185 C61     HISTORY OF PRESENT ILLNESS: Ricky [name] is a 76 y.o. male with a newly diagnosed prostate cancer. He was noted to have an elevated PSA of 9.02 on 09/13/16. He has been followed by Dr. Karsten Ro for many years and has previously had some type of microwave procedure of the prostate years ago, for what sounds like BPH. He was seen more recently due to his elevated PSA on 09/20/16 for discussion of working this up. A  digital rectal examination was performed on 10/10/16 revealing no nodularity.  The patient proceeded to transrectal ultrasound with 12 biopsies of the prostate on 10/10/16.  The prostate volume measured 78 cc.  Out of 12 core biopsies,10 were positive.  The maximum Gleason score was 3+4, and this was seen in left mid lateral, left apex lateral, left base, left apex, right base, right mid, and right mid lateral. A CT scan of the pelvis was performed on 10/17/16. This revealed no adenopathy or osseous metastasis to the pelvis.  The patient reviewed the biopsy results with his urologist and he has kindly been referred today for discussion of potential radiation treatment options.   PREVIOUS RADIATION THERAPY: No  PAST MEDICAL HISTORY:  Past Medical History:  Diagnosis Date  . Allergy   . BPH (benign prostatic hyperplasia)   . Cataract   . Chronic cough   . Colon polyps   . Deviated nasal septum   . Hypertension   . Memory difficulty 09/22/2016  . Prostate cancer ([place])        PAST SURGICAL HISTORY: Past Surgical History:  Procedure Laterality Date  . COLONOSCOPY  2011  . EYE SURGERY Right    cataracts  . EYE SURGERY Left    cataracts  . HAND SURGERY Left 1992  . JOINT REPLACEMENT Bilateral 2010   bilat knees  . KNEE ARTHROSCOPY Bilateral 1960's and 70's  . PROSTATE BIOPSY    . ROTATOR CUFF REPAIR Right 2004  . TOE SURGERY Left 1970   little toe  . VASECTOMY      FAMILY HISTORY:  Family History  Problem Relation Age of Onset  . Healthy Mother   . Cancer Father     prostate  . Cancer Brother   . Alcohol abuse Brother   . Bipolar disorder Brother   . Colon cancer Neg Hx   . Esophageal cancer Neg Hx   . Rectal cancer Neg Hx   . Stomach cancer Neg Hx     SOCIAL HISTORY:  Social History   Social History  . Marital status: Married    Spouse name: [name]  . Number of children: 3  . Years of education: BA   Occupational History  . Retired    Social History Main Topics  . Smoking status: Never Smoker  . Smokeless tobacco: Never Used  . Alcohol use Yes     Comment: rare  . Drug use: No  . Sexual activity: Yes    Partners:  Female     Comment: Married   Other Topics Concern  . Not on file   Social History Narrative   Lives at home w/ his wife   Right-handed   Caffeine: none  The patient is married, and lives in [place]. He is retired from working as an Clinical biochemist for [employer]: Patient has no known allergies.  MEDICATIONS:  Current Outpatient Prescriptions  Medication Sig Dispense Refill  . ALPRAZolam (XANAX) 0.5 MG tablet Take 2 tablets approximately 45 minutes prior to the MRI study, take a third tablet if needed. 3 tablet 0  . amLODipine (NORVASC) 10 MG tablet TAKE 1 BY MOUTH DAILY 90 tablet 3  . Cholecalciferol (VITAMIN D PO) Take 1 capsule by mouth daily.     Derrill Memo ON 12/03/2016] donepezil (ARICEPT) 10 MG tablet Take 1 tablet (10 mg total) by mouth at bedtime. 30 tablet 2  . donepezil  (ARICEPT) 5 MG tablet Take 1 tablet (5 mg total) by mouth at bedtime. 30 tablet 0  . doxazosin (CARDURA) 2 MG tablet Take 1 tablet (2 mg total) by mouth daily. 90 tablet 1  . FIBER FORMULA PO Take 3 capsules by mouth 2 (two) times daily.    . fluticasone (FLONASE) 50 MCG/ACT nasal spray One to 2 sprays each nostril at bedtime and stay on this medication for that at least the next 4-6 weeks 16 g 6  . losartan (COZAAR) 100 MG tablet TAKE 1 BY MOUTH DAILY 90 tablet 1  . potassium chloride SA (K-DUR,KLOR-CON) 20 MEQ tablet TAKE 1 TABLET BY MOUTH 3 TIMES DAILY 270 tablet 0  . tamsulosin (FLOMAX) 0.4 MG CAPS capsule Take 1 capsule (0.4 mg total) by mouth 2 (two) times daily. 90 capsule 3  . tolterodine (DETROL LA) 4 MG 24 hr capsule TK ONE C PO QHS  10   No current facility-administered medications for this encounter.     REVIEW OF SYSTEMS:  On review of systems, the patient reports that he is doing well overall. He denies any chest pain, shortness of breath, cough, fevers, chills, night sweats, unintended weight changes.  He denies any bowel disturbances, and denies abdominal pain, nausea or vomiting. He denies any new musculoskeletal or joint aches or pains. 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 A complete review of systems is obtained and is otherwise negative. The patient completed an IPSS and IIEF questionnaire. His IPSS score was 23 indicating severe urinary outflow obstructive symptoms. He notes nocturia x 4, hematuria, leakage, mild intermittent dysuria, and urinary hesitancy. Patient reports he is now "missing a muscle to pee". He notes he is unable to urinate unless "he holds the end of his penis until it fills with urine." He indicated that his erectile function is able to complete sexual activity most of the time.   PHYSICAL EXAM:  Wt Readings from Last 3 Encounters:  11/13/16 203 lb 6.4 oz (92.3 kg)  09/22/16 203 lb 8 oz (92.3 kg)  09/21/16 208  lb (94.3 kg)   Temp Readings from Last 3 Encounters:  09/21/16 97 F (36.1 C) (Oral)  08/28/16 97.4 F (36.3 C) (Oral)  08/18/16 97 F (36.1 C) (Oral)   BP Readings from Last 3 Encounters:  11/13/16 (!) 171/85  09/22/16 (!) 141/88  09/21/16 128/78   Pulse Readings from Last 3 Encounters:  11/13/16 (!) 58  09/22/16 68  09/21/16 78   Pain Scale 0/10 In general  this is a well appearing caucasian man in no acute distress. He is alert and oriented x4 and appropriate throughout the examination. HEENT reveals that the patient is normocephalic, atraumatic. EOMs are intact. PERRLA. Skin is intact without any evidence of gross lesions. Cardiovascular exam reveals a regular rate and rhythm, no clicks rubs or murmurs are auscultated. Chest is clear to auscultation bilaterally. Lymphatic assessment is performed and does not reveal any adenopathy in the cervical, supraclavicular, axillary, or inguinal chains. Abdomen has active bowel sounds in all quadrants and is intact. The abdomen is soft, non tender, non distended. Lower extremities are negative for pretibial pitting edema, deep calf tenderness, cyanosis or clubbing.   KPS = 90  100 - Normal; no complaints; no evidence of disease. 90   - Able to carry on normal activity; minor signs or symptoms of disease. 80   - Normal activity with effort; some signs or symptoms of disease. 71   - Cares for self; unable to carry on normal activity or to do active work. 60   - Requires occasional assistance, but is able to care for most of his personal needs. 50   - Requires considerable assistance and frequent medical care. 49   - Disabled; requires special care and assistance. 39   - Severely disabled; hospital admission is indicated although death not imminent. 66   - Very sick; hospital admission necessary; active supportive treatment necessary. 10   - Moribund; fatal processes progressing rapidly. 0     - Dead  Karnofsky DA, Abelmann [place], Craver LS  and Burchenal [facility] [phone]) The use of the nitrogen mustards in the palliative treatment of carcinoma: with particular reference to bronchogenic carcinoma Cancer 1 [phone]  LABORATORY DATA:  Lab Results  Component Value Date   WBC 4.5 08/10/2016   HGB 13.9 (A) 07/02/2014   HCT 43.4 08/10/2016   MCV 86 08/10/2016   PLT 184 08/10/2016   Lab Results  Component Value Date   NA 145 (H) 08/10/2016   K 3.8 08/10/2016   CL 103 08/10/2016   CO2 23 08/10/2016   Lab Results  Component Value Date   ALT 18 08/10/2016   AST 18 08/10/2016   ALKPHOS 91 08/10/2016   BILITOT 0.5 08/10/2016     RADIOGRAPHY: No results found.    IMPRESSION/PLAN: 1. 76 y.o. gentleman with intermediate risk, Stage T1c adenocarcinoma of the prostate with a Gleason Score of 3+4, and PSA of 9.02.  We reviewed his workup to date and reviewed that his Gleason's Score and PSA put him into the intermediate risk group.   He falls into a select sub-set of patients with intermediate risk disease who are eligible for seed implant with primary Gleason grade of 3 and less than half of one lobe positive for Gleason's 7 disease.   Accordingly he is eligible for a variety of potential treatment options including external beam radiation, and possibly radioactive seed implant.  We discussed radiation treatment in the management of prostate cancer with regard to the logistics and delivery of external beam radiation treatment as well as the logistics and delivery of prostate brachytherapy.  We compared and contrasted each of these approaches and also compared these against prostatectomy. His prostate is large at 78 cc by ultrasound and 85 cc by CT, and his urinary symptom score of IPSS=23 may be problematic for prostate seed implant, unless the patient would be willing to consider androgen deprivation. We discussed this option in great detail. The patient is most  interested in seed implant including androgen deprivation in order to downsize his  gland. We will share this with Dr. Karsten Ro and move forward with scheduling ADT and repeat volume study in approximately 3 months. If his prostate volume is at or below 60 cc, we will proceed with seed implant.  2. Possible genetic predisposition for malignancy. Patient has a suspicious personal and family history concerning for possible BRCA2 mutation. I will refer him for genetic counseling. 3. Symptoms concerning for pelvic floor relaxation. The patient was advised to contact urology for evaluation with her physical therapist.  The above documentation reflects my direct findings during this shared patient visit. Please see the separate note by Dr. Tammi Klippel on this date for the remainder of the patient's plan of care.   Carola Rhine, PAC   Please see above from Shona Simpson, PA-C from today's visit for more details of today's encounter.  I have personally performed a face to face diagnostic evaluation on this patient and devised the assessment and plan. ------------------------------------------------   Tyler Pita, MD [place] Director and Director of Stereotactic Radiosurgery Direct Dial: [phone]  Fax: [phone] [email or web address]  Skype  [email or web address]     This document serves as a record of services personally performed by Shona Simpson, PA-C and Tyler Pita, MD. It was created on their behalf by Bethann Humble, a trained medical scribe. The creation of this record is based on the scribe's personal observations and the provider's statements to them. This document has been checked and approved by the attending provider.

## 2016-11-13 NOTE — Progress Notes (Signed)
See progress note under physician encounter. 

## 2016-11-14 ENCOUNTER — Other Ambulatory Visit: Payer: Self-pay | Admitting: Family Medicine

## 2016-11-22 ENCOUNTER — Telehealth: Payer: Self-pay | Admitting: Family Medicine

## 2016-11-22 DIAGNOSIS — Z5111 Encounter for antineoplastic chemotherapy: Secondary | ICD-10-CM | POA: Diagnosis not present

## 2016-11-22 DIAGNOSIS — C61 Malignant neoplasm of prostate: Secondary | ICD-10-CM | POA: Diagnosis not present

## 2016-11-22 NOTE — Telephone Encounter (Signed)
Patient states he thinks that his donepezil 10mg  is supposed to increase to 15mg  when his pills run out an approximately a week. Patient states that the pharmacy called and told him he had a rx to pick up and it was donepezil 5mg . Patient has not started taking the 5mg  and wants to know if the 10mg  is supposed to be increased? Patient aware [facility] is not here but states that Roselyn Reef knows about it. Please advise.

## 2016-11-22 NOTE — Telephone Encounter (Signed)
Very detailed message left for pt = as of note from 11/02/16 he would take 5 mg for 1 month then go back up to 10 mg daily --- 15 mg is not an option and should not be taking that much.  LM to call back to speak with me with any further concerns.

## 2016-11-27 ENCOUNTER — Ambulatory Visit (HOSPITAL_BASED_OUTPATIENT_CLINIC_OR_DEPARTMENT_OTHER): Payer: Medicare Other | Admitting: Genetic Counselor

## 2016-11-27 ENCOUNTER — Encounter: Payer: Self-pay | Admitting: Genetic Counselor

## 2016-11-27 ENCOUNTER — Other Ambulatory Visit: Payer: Medicare Other

## 2016-11-27 DIAGNOSIS — Z315 Encounter for genetic counseling: Secondary | ICD-10-CM | POA: Diagnosis not present

## 2016-11-27 DIAGNOSIS — Z803 Family history of malignant neoplasm of breast: Secondary | ICD-10-CM

## 2016-11-27 DIAGNOSIS — Z8042 Family history of malignant neoplasm of prostate: Secondary | ICD-10-CM

## 2016-11-27 DIAGNOSIS — C61 Malignant neoplasm of prostate: Secondary | ICD-10-CM | POA: Diagnosis not present

## 2016-11-27 NOTE — Progress Notes (Addendum)
[facility]   Patient Name: Ricky [name] Patient DOB: 03-13-1941 Encounter Date: 11/27/2016  Referring Provider: Tyler Pita, MD  Primary Care Provider: Redge Gainer, MD  Reason for Visit: Evaluate for hereditary susceptibility to cancer   Ricky [name], a 76 y.o. male, is being seen at the [facility] due to a personal and family history of cancer. He presents to clinic today to discuss the possibility of a hereditary predisposition to cancer and discuss whether genetic testing is warranted.  History of Present Illness: Ricky [name] was diagnosed with prostate cancer (Gleason 7) at the age of 2.  He does not recall his exact treatment plan, but indicated that he is not a candidate for surgery.   He reported having a colonoscopy every 5 years and that few polyps have been removed in the past, but he did not know how many or what type(s).  Of note, he indicated he suffers from memory loss.  Past Medical History:  Diagnosis Date  . Allergy   . BPH (benign prostatic hyperplasia)   . Cataract   . Chronic cough   . Colon polyps   . Deviated nasal septum   . Family history of prostate cancer   . Hypertension   . Memory difficulty 09/22/2016  . Prostate CA ([place])   . Prostate cancer [facility])     Past Surgical History:  Procedure Laterality Date  . COLONOSCOPY  2011  . EYE SURGERY Right    cataracts  . EYE SURGERY Left    cataracts  . HAND SURGERY Left 1992  . JOINT REPLACEMENT Bilateral 2010   bilat knees  . KNEE ARTHROSCOPY Bilateral 1960's and 70's  . PROSTATE BIOPSY    . ROTATOR CUFF REPAIR Right 2004  . TOE SURGERY Left 1970   little toe  . VASECTOMY      Social History   Social History  . Marital status: Married    Spouse name: [name]  . Number of children: 3  . Years of education: BA   Occupational History  . Retired    Social History Main Topics  . Smoking status: Never Smoker  . Smokeless  tobacco: Never Used  . Alcohol use Yes     Comment: rare  . Drug use: No  . Sexual activity: Yes    Partners: Female     Comment: Married   Other Topics Concern  . Not on file   Social History Narrative   Lives at home w/ his wife   Right-handed   Caffeine: none     Family History:  During the visit, a 4-generation pedigree was obtained. Family tree will be scanned in the Media tab in Epic  Significant diagnoses include the following:  Family History  Problem Relation Age of Onset  . Healthy Mother   . Prostate cancer Father     Dx 41s; Deceased 69  . Prostate cancer Brother 32    currently 57  . Alcohol abuse Brother   . Bipolar disorder Brother   . Prostate cancer Paternal Uncle     unsure of age; possibly 2nd uncle also has prostate ca  . Prostate cancer Maternal Uncle     unsure of age  . Breast cancer Maternal Aunt     unsure of age  . Colon cancer Neg Hx   . Esophageal cancer Neg Hx   . Rectal cancer Neg Hx   . Stomach  cancer Neg Hx     Additionally, Ricky [name] has 3 sons (ages 3, 65 and 71). He has 3 other brothers (ages 22, 3, and 53). He has very little concrete information about the medical history of his family members attributed to memory loss.  Ricky [name] ancestry is Caucasian - NOS. There is no known Jewish ancestry and no consanguinity.  Assessment and Plan: Ricky [name] is a 76 y.o. male with a personal and family history of prostate cancer as noted above. This history is not highly suggestive of a hereditary predisposition to cancer, but given his Gleason 7 prostate cancer and several family members with prostate cancer, genetic testing is warranted. Genetic testing can determine whether he has a pathogenic mutation that would impact his future cancer screening and risk-reduction options. We reviewed the characteristics, features and inheritance patterns of hereditary cancer syndromes. We discussed the process of genetic testing, including insurance  coverage and implications of results: positive, negative and Variant of Uncertain Significance. A negative result will be reassuring.   Ricky [name] wished to pursue genetic testing and a blood sample will be sent for analysis of the 43 genes on [facility]'s Common Cancers panel (APC, ATM, AXIN2, BARD1, BMPR1A, BRCA1, BRCA2, BRIP1, CDH1, CDKN2A, CHEK2, DICER1, EPCAM, GREM1, HOXB13, KIT, MEN1, MLH1, MSH2, MSH6, MUTYH, NBN, NF1, PALB2, PDGFRA, PMS2, POLD1, POLE, PTEN, RAD50, RAD51C, RAD51D, SDHA, SDHB, SDHC, SDHD, SMAD4, SMARCA4, STK11, TP53, TSC1, TSC2, VHL). Results should be available in approximately 2-4 weeks, at which point we will contact him and address implications for him as well as address genetic testing for at-risk family members, if needed.    ADDENDUM: Ricky [name] was contact by Lillard Anes to disclose that his cost of testing would be $210. He declined to proceed with testing and the test was canceled.  Ricky [name] is encouraged to remain in contact with Cancer Genetics annually so that we can update the family history and inform him of any changes in cancer genetics and testing that may be of benefit for this family. [name] questions were answered to his satisfaction today and he is welcome to call with any additional questions or concerns. Thank you for the referral and allowing [place] to share in the care of your patient.   Dr. Jana Hakim was available for questions concerning this case. Total time spent by Ricky Berg, MS, CGC in face-to-face counseling was approximately 30 minutes.   Ricky Berg, MS, [place] Certified Genetic Counselor phone: [phone] [name]   ______________________________________________________________________ For Office Staff:  Number of people involved in session: 1 Was an Intern/ student involved with case: no

## 2016-11-28 ENCOUNTER — Encounter: Payer: Self-pay | Admitting: Podiatry

## 2016-11-28 ENCOUNTER — Other Ambulatory Visit: Payer: Self-pay | Admitting: Family Medicine

## 2016-11-28 ENCOUNTER — Ambulatory Visit (INDEPENDENT_AMBULATORY_CARE_PROVIDER_SITE_OTHER): Payer: Medicare Other | Admitting: Podiatry

## 2016-11-28 DIAGNOSIS — L608 Other nail disorders: Secondary | ICD-10-CM | POA: Diagnosis not present

## 2016-11-28 NOTE — Progress Notes (Signed)
Patient ID: Ricky [name], male   DOB: Oct 06, 1941, 76 y.o.   MRN: [id number]   Subjective: This patient presents again complaining of discomfort in the margins of the toenails primarily the hallux toenails right and left feet. He presents occasionally for debridement. In the past we have discussed possible permanent nail procedures, however, patient is not opting for this today  Objective: Orientated 3 DP pulse 2/4 bilaterally PT pulses 2/4 bilaterally Capillary reflex immediate bilaterally Sensation to 10 g monofilament wire intact 5/5 bilaterally Vibratory sensation reactive right nonreactive left Ankle reflexes reactive bilaterally No open skin lesions bilaterally The toenails are elongated, with incurvated and ingrowing medial lateral borders of the hallux toenails .Assessment: Incurvated toenails 6-10 with primary symptoms in the  hallux toenails Ingrowing medial lateral borders the right and left hallux toenails Plan: Debridement of toenails 10 and mechanically and electrically without anybleeding. Patient is wear permanent nail surgery, however, is declining this procedure  Reappoint at patient's request  Reappoint at patient's request

## 2016-11-30 ENCOUNTER — Other Ambulatory Visit: Payer: Self-pay | Admitting: Family Medicine

## 2016-12-07 ENCOUNTER — Telehealth: Payer: Self-pay | Admitting: Family Medicine

## 2016-12-07 DIAGNOSIS — R5383 Other fatigue: Secondary | ICD-10-CM

## 2016-12-07 NOTE — Telephone Encounter (Signed)
Will need current CPAPinfromation in order to place order

## 2016-12-08 NOTE — Telephone Encounter (Signed)
Has to have sleep study and be diagnosed with slepp apnea to get CPAP machine

## 2016-12-11 NOTE — Progress Notes (Signed)
ADDENDUM: Ricky [name] was contact by Lillard Anes to disclose that his cost of testing would be $210. He declined to proceed with testing and the test was canceled.

## 2016-12-13 ENCOUNTER — Ambulatory Visit: Payer: Medicare Other | Admitting: Genetic Counselor

## 2016-12-13 DIAGNOSIS — Z803 Family history of malignant neoplasm of breast: Secondary | ICD-10-CM

## 2016-12-13 DIAGNOSIS — C61 Malignant neoplasm of prostate: Secondary | ICD-10-CM

## 2016-12-13 DIAGNOSIS — Z8042 Family history of malignant neoplasm of prostate: Secondary | ICD-10-CM

## 2016-12-13 NOTE — Progress Notes (Signed)
REFERRING PROVIDER: Chipper Herb, MD Ricky [name], [place] [address]  PRIMARY PROVIDER:  Redge Gainer, MD  PRIMARY REASON FOR VISIT:  1. Family history of prostate cancer   2. Malignant neoplasm prostate ([place])   3. Family history of breast cancer      HISTORY OF PRESENT ILLNESS:   Ricky [name], a 76 y.o. male, was seen for a [place] cancer genetics consultation at the request of Ricky [name] due to a personal and family history of cancer.  Ricky [name] presents to clinic today to discuss the possibility of a hereditary predisposition to cancer, genetic testing, and to further clarify his future cancer risks, as well as potential cancer risks for family members.   In 2017, at the age of 58, Ricky [name] was diagnosed with prostate cancer, Gleason Score of 7. Ricky [name] called today to reschedule his genetic testing.    CANCER HISTORY:   No history exists.      Past Medical History:  Diagnosis Date  . Allergy   . BPH (benign prostatic hyperplasia)   . Cataract   . Chronic cough   . Colon polyps   . Deviated nasal septum   . Family history of prostate cancer   . Hypertension   . Memory difficulty 09/22/2016  . Prostate CA ([place])   . Prostate cancer [facility])     Past Surgical History:  Procedure Laterality Date  . COLONOSCOPY  2011  . EYE SURGERY Right    cataracts  . EYE SURGERY Left    cataracts  . HAND SURGERY Left 1992  . JOINT REPLACEMENT Bilateral 2010   bilat knees  . KNEE ARTHROSCOPY Bilateral 1960's and 70's  . PROSTATE BIOPSY    . ROTATOR CUFF REPAIR Right 2004  . TOE SURGERY Left 1970   little toe  . VASECTOMY      Social History   Social History  . Marital status: Married    Spouse name: Ricky [name]  . Number of children: 3  . Years of education: BA   Occupational History  . Retired    Social History Main Topics  . Smoking status: Never Smoker  . Smokeless tobacco: Never Used  . Alcohol use Yes     Comment: rare  . Drug use: No  . Sexual activity:  Yes    Partners: Female     Comment: Married   Other Topics Concern  . Not on file   Social History Narrative   Lives at home w/ his wife   Right-handed   Caffeine: none     FAMILY HISTORY:  We obtained a detailed, 4-generation family history.  Significant diagnoses are listed below: Family History  Problem Relation Age of Onset  . Healthy Mother   . Prostate cancer Father     Dx 13s; Deceased 25  . Prostate cancer Brother 72    currently 60  . Alcohol abuse Brother   . Bipolar disorder Brother   . Prostate cancer Paternal Uncle     unsure of age; possibly 2nd uncle also has prostate ca  . Prostate cancer Maternal Uncle     unsure of age  . Breast cancer Maternal Aunt     unsure of age  . Colon cancer Neg Hx   . Esophageal cancer Neg Hx   . Rectal cancer Neg Hx   . Stomach cancer Neg Hx     Ricky [name] is unaware of previous family history of genetic testing for hereditary cancer  risks. Patient's maternal ancestors are of Caucasian descent, and paternal ancestors are of Caucasian descent. There in no reported Ashkenazi Jewish ancestry. There is no known consanguinity.  GENETIC COUNSELING ASSESSMENT: Ricky [name] is a 76 y.o. male with a personal and family history of prostate cancer which is somewhat suggestive of a hereditary cancer syndrome and predisposition to cancer. We, therefore, discussed and recommended the following at today's visit.   DISCUSSION: We discussed that about 5-10% of prostate cancer is considered to be hereditary, most commonly due to BRCA mutations.  Ricky [name] was very concerned about a $210 bill he received from his previous test that he had canceled.  He did not bring the bill with him, so I was unable to determine if the bill was from [facility] or the testing laboratory, [facility].  I called [facility] and they confirmed that they determined that his out of pocket cost was going to be $205. As this test has been cancelled, he does not owe this currently. I  let the patient know that this was going to be the out of pocket cost if he wanted to proceed with testing.  Ricky [name] was concerned about the cost of his healthcare, and seemed to be confused on how genetic testing may or may not help him.  I tried to review sporadic and hereditary cancer, and that testing could tell him if his prostate cancer was due to a hereditary cause and if his sons needed to consider testing.  He did not seem to understand this.  We discussed that he should discuss testing with his spouse and his children to see if they wanted him to pursue testing.  He decided to do that and will call if he wants to pursue genetic testing.  PLAN: Despite our recommendation, Ricky [name] did not wish to pursue genetic testing at today's visit. We understand this decision, and remain available to coordinate genetic testing at any time in the future. We; therefore, recommend Ricky [name] continue to follow the cancer screening guidelines given by his primary healthcare provider.  Lastly, we encouraged Ricky [name] to remain in contact with cancer genetics annually so that we can continuously update the family history and inform him of any changes in cancer genetics and testing that may be of benefit for this family.   [name] questions were answered to his satisfaction today. Our contact information was provided should additional questions or concerns arise. Thank you for the referral and allowing [place] to share in the care of your patient.   Ricky [name] Ricky [name], [place], [facility] Certified Genetic Counselor [name] phone: [phone]  The patient was seen for a total of 60 minutes in face-to-face genetic counseling.  This patient was discussed with Drs. Magrinat, Lindi Adie and/or Burr Medico who agrees with the above.    _______________________________________________________________________ For Office Staff:  Number of people involved in session: 1 Was an Intern/ student involved with case: no

## 2016-12-18 ENCOUNTER — Other Ambulatory Visit: Payer: Self-pay | Admitting: Family Medicine

## 2016-12-18 ENCOUNTER — Telehealth: Payer: Self-pay | Admitting: Genetic Counselor

## 2016-12-18 ENCOUNTER — Other Ambulatory Visit: Payer: Medicare Other

## 2016-12-18 DIAGNOSIS — Z8042 Family history of malignant neoplasm of prostate: Secondary | ICD-10-CM | POA: Diagnosis not present

## 2016-12-18 DIAGNOSIS — Z803 Family history of malignant neoplasm of breast: Secondary | ICD-10-CM | POA: Diagnosis not present

## 2016-12-18 DIAGNOSIS — C61 Malignant neoplasm of prostate: Secondary | ICD-10-CM | POA: Diagnosis not present

## 2016-12-18 NOTE — Telephone Encounter (Signed)
The patient asked that I call him about the cost of testing.  He asked if I had found out how much this was going to cost.  I reminded him of our conversation that it would cost about $205, and that I had asked for him to talk with his relatives about whether they would want him to have this test.  He said he spoke with one son about it and he decided to go ahead.  He had many questions about co-insurance and why he was being charged this amount.  I offered to have him talk with financial counseling and he declined.  He asked how long it will take to get the results and how he would get them. I told him that I would call him as soon as I get them and can either release them to him via email or send them in a letter.

## 2016-12-20 ENCOUNTER — Ambulatory Visit (INDEPENDENT_AMBULATORY_CARE_PROVIDER_SITE_OTHER): Payer: Medicare Other | Admitting: *Deleted

## 2016-12-20 VITALS — BP 143/82 | HR 58 | Ht 65.5 in | Wt 204.0 lb

## 2016-12-20 DIAGNOSIS — Z0000 Encounter for general adult medical examination without abnormal findings: Secondary | ICD-10-CM

## 2016-12-20 NOTE — Progress Notes (Signed)
Subjective:   Ricky [name] is a 76 y.o. male who presents for an Initial Medicare Annual Wellness Visit. 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  [place] here in [place].  Ricky [name] is married to his second wife.  He has 3 sons, 2 sons live here and the other in [place], [place].   Ricky [name] states that he had a recent biopsy of his prostate which indicated that he has active prostate cancer, for which he will be receiving treatment.     Objective:    Today's Vitals   12/20/16 1242  BP: (!) 143/82  Pulse: (!) 58  Weight: 204 lb (92.5 kg)  Height: 5' 5.5" (1.664 m)   Body mass index is 33.43 kg/m.  Current Medications (verified) Outpatient Encounter Prescriptions as of 12/20/2016  Medication Sig  . amLODipine (NORVASC) 10 MG tablet TAKE 1 BY MOUTH DAILY  . Cholecalciferol (VITAMIN D PO) Take 1 capsule by mouth daily.   Marland Kitchen donepezil (ARICEPT) 10 MG tablet TAKE 1 TABLET(10 MG) BY MOUTH AT BEDTIME  . doxazosin (CARDURA) 2 MG tablet TAKE 1 TABLET BY MOUTH DAILY  . FIBER FORMULA PO Take 3 capsules by mouth 2 (two) times daily.  . fluticasone (FLONASE) 50 MCG/ACT nasal spray One to 2 sprays each nostril at bedtime and stay on this medication for that at least the next 4-6 weeks  . losartan (COZAAR) 100 MG tablet TAKE 1 BY MOUTH DAILY  . potassium chloride SA (K-DUR,KLOR-CON) 20 MEQ tablet TAKE 1 TABLET BY MOUTH 3 TIMES DAILY  . tamsulosin (FLOMAX) 0.4 MG CAPS capsule TAKE ONE CAPSULE BY MOUTH TWICE DAILY  . tolterodine (DETROL LA) 4 MG 24 hr capsule TK ONE C PO QHS  . [DISCONTINUED] ALPRAZolam (XANAX) 0.5 MG tablet Take 2 tablets approximately 45 minutes prior to the MRI study, take a third tablet if needed.  . [DISCONTINUED] donepezil (ARICEPT) 5 MG  tablet Take 1 tablet (5 mg total) by mouth at bedtime.   No facility-administered encounter medications on file as of 12/20/2016.     Allergies (verified) Patient has no known allergies.   History: Past Medical History:  Diagnosis Date  . Allergy   . BPH (benign prostatic hyperplasia)   . Cataract   . Chronic cough   . Colon polyps   . Deviated nasal septum   . Family history of prostate cancer   . Hypertension   . Memory difficulty 09/22/2016  . Prostate CA ([place])   . Prostate cancer [facility])    Past Surgical History:  Procedure Laterality Date  . COLONOSCOPY  2011  . EYE SURGERY Right    cataracts  . EYE SURGERY Left    cataracts  . HAND SURGERY Left 1992  . JOINT REPLACEMENT Bilateral 2010   bilat knees  . KNEE ARTHROSCOPY Bilateral 1960's and 70's  . PROSTATE BIOPSY    . ROTATOR CUFF REPAIR Right 2004  . TOE SURGERY Left 1970   little toe  . VASECTOMY     Family History  Problem Relation Age of Onset  . Healthy Mother   . Prostate cancer Father     Dx 48s; Deceased 44  . Prostate cancer Brother 2  currently 70  . Alcohol abuse Brother   . Bipolar disorder Brother   . Prostate cancer Paternal Uncle     unsure of age; possibly 2nd uncle also has prostate ca  . Prostate cancer Maternal Uncle     unsure of age  . Breast cancer Maternal Aunt     unsure of age  . Colon cancer Neg Hx   . Esophageal cancer Neg Hx   . Rectal cancer Neg Hx   . Stomach cancer Neg Hx    Social History   Occupational History  . Retired    Social History Main Topics  . Smoking status: Never Smoker  . Smokeless tobacco: Never Used  . Alcohol use Yes     Comment: rare  . Drug use: No  . Sexual activity: Yes    Partners: Female     Comment: Married   Tobacco Counseling Never used tobacco  Activities of Daily Living In your present state of health, do you have any difficulty performing the following activities: 12/20/2016 12/29/2015  Hearing? Tempie Donning  Vision? N N    Difficulty concentrating or making decisions? N N  Walking or climbing stairs? N Y  Dressing or bathing? N N  Doing errands, shopping? N N  Preparing Food and eating ? - N  Using the Toilet? - N  In the past six months, have you accidently leaked urine? - N  Do you have problems with loss of bowel control? - N  Managing your Medications? - N  Managing your Finances? - N  Housekeeping or managing your Housekeeping? - N  Some recent data might be hidden   No problems with preparing food or eating, using toilet, urine leakage, bowl control, managing medications, finances, or housekeeping.   Immunizations and Health Maintenance Immunization History  Administered Date(s) Administered  . Influenza, High Dose Seasonal PF 08/10/2016  . Influenza,inj,Quad PF,36+ Mos 07/20/2014     Patient Care Team: Chipper Herb, MD as PCP - General (Family Medicine) Celene Squibb, MD as Consulting Physician (Dermatology) Tanda Rockers, MD as Consulting Physician (Pulmonary Disease) Kathie Rhodes, MD as Consulting Physician (Urology) Rutherford Guys, MD as Consulting Physician (Ophthalmology)      Assessment:   This is a routine wellness examination for Ricky [name].  Hearing/Vision screen Patient has had hearing checked, and says he has a deficit but does not wish to have hearing aids.  He sees Dr. Gershon Crane yearly, had cataracts removed   Dietary issues and exercise activities discussed: Current Exercise Habits: The patient does not participate in regular exercise at present, Exercise limited by: orthopedic condition(s)  Patient states he has oatmeal and fruit for breakfast, a salad most days for lunch, and a meat and 2 vegetables for dinner.  He tries to avoid snacking on [name], chips, and baked goods.   Goals    . Exercise 3x per week (30 min per time)          Encouraged riding stationary bike building up to 30 minutes 3 times per week, and using light weigth dumbbells for resistance training.       Depression Screen PHQ 2/9 Scores 12/20/2016 11/13/2016 09/21/2016 08/28/2016  PHQ - 2 Score 0 0 0 0    Fall Risk Fall Risk  12/20/2016 11/13/2016 09/21/2016 08/28/2016 08/10/2016  Falls in the past year? Yes No Yes Yes Yes  Number falls in past yr: 2 or more - 2 or more 2 or more 1  Injury with Fall? - -  No No No  Risk for fall due to : History of fall(s);Impaired balance/gait - - - -  Risk for fall due to (comments): patient states he feels he shuffles his feet when walking which causes poor balance at times.  Discussed using cane when feeling unsteady, and making sure he picks his feet up using proper body mechanics for gait.   - - - -  Follow up Education provided;Falls prevention discussed - - - -    Cognitive Function: MMSE - Mini Mental State Exam 12/20/2016 09/22/2016 08/10/2016 12/29/2015  Not completed: - - - Refused  Orientation to time 4 4 4 5   Orientation to Place 5 3 5 5   Registration 3 3 3 3   Attention/ Calculation 5 3 3 5   Recall 3 3 3 3   Language- name 2 objects 2 2 2 2   Language- repeat 1 1 1 1   Language- follow 3 step command 3 3 3 3   Language- read & follow direction 1 1 1 1   Write a sentence 1 1 1 1   Copy design 1 1 1 1   Total score 29 25 27 30         Screening Tests Health Maintenance  Topic Date Due  . TETANUS/TDAP  03/22/2017 (Originally 06/10/2015)  . PNA vac Low Risk Adult (1 of 2 - PCV13) 03/22/2017 (Originally 06/27/2006)  . COLON CANCER SCREENING ANNUAL FOBT  12/31/2016  . COLONOSCOPY  08/08/2018  . INFLUENZA VACCINE  Completed   Patient states he wishes to update his tdap and prevnar vaccines when he sees Dr. Laurance Flatten in May.       Plan:     During the course of the visit Ricky [name] was educated and counseled about the following appropriate screening and preventive services:   Vaccines to include Pneumoccal, Influenza, Td, Zostavax- patient wishes to address when he sees Dr. Laurance Flatten in May.  Reminded patient of this appointment  02/13/17.  Electrocardiogram- recommend at follow up 02/13/17  Colorectal cancer screening- up to date  Cardiovascular disease screening-recommend at follow up 02/13/17  Glaucoma screening- up to date- will obtain from Dr. Kellie Moor office  Nutrition counseling- discussed today  Smoking cessation counseling- never smoker   Patient Instructions (the written plan) were given to the patient.   WYATT, AMY M, RN   12/20/2016   I have reviewed and agree with the above AWV documentation.   Arrie Senate MD

## 2016-12-20 NOTE — Progress Notes (Deleted)
Subjective:   Ricky [name] is a 76 y.o. male who presents for an Initial Medicare Annual Wellness Visit.  Review of Systems  ***      Objective:    There were no vitals filed for this visit. There is no height or weight on file to calculate BMI.  Current Medications (verified) Outpatient Encounter Prescriptions as of 12/20/2016  Medication Sig  . ALPRAZolam (XANAX) 0.5 MG tablet Take 2 tablets approximately 45 minutes prior to the MRI study, take a third tablet if needed.  Marland Kitchen amLODipine (NORVASC) 10 MG tablet TAKE 1 BY MOUTH DAILY  . Cholecalciferol (VITAMIN D PO) Take 1 capsule by mouth daily.   Marland Kitchen donepezil (ARICEPT) 10 MG tablet TAKE 1 TABLET(10 MG) BY MOUTH AT BEDTIME  . donepezil (ARICEPT) 5 MG tablet Take 1 tablet (5 mg total) by mouth at bedtime.  Marland Kitchen doxazosin (CARDURA) 2 MG tablet TAKE 1 TABLET BY MOUTH DAILY  . FIBER FORMULA PO Take 3 capsules by mouth 2 (two) times daily.  . fluticasone (FLONASE) 50 MCG/ACT nasal spray One to 2 sprays each nostril at bedtime and stay on this medication for that at least the next 4-6 weeks  . losartan (COZAAR) 100 MG tablet TAKE 1 BY MOUTH DAILY  . potassium chloride SA (K-DUR,KLOR-CON) 20 MEQ tablet TAKE 1 TABLET BY MOUTH 3 TIMES DAILY  . tamsulosin (FLOMAX) 0.4 MG CAPS capsule TAKE ONE CAPSULE BY MOUTH TWICE DAILY  . tolterodine (DETROL LA) 4 MG 24 hr capsule TK ONE C PO QHS   No facility-administered encounter medications on file as of 12/20/2016.     Allergies (verified) Patient has no known allergies.   History: Past Medical History:  Diagnosis Date  . Allergy   . BPH (benign prostatic hyperplasia)   . Cataract   . Chronic cough   . Colon polyps   . Deviated nasal septum   . Family history of prostate cancer   . Hypertension   . Memory difficulty 09/22/2016  . Prostate CA ([place])   . Prostate cancer [facility])    Past Surgical History:  Procedure Laterality Date  . COLONOSCOPY  2011  . EYE SURGERY Right    cataracts  . EYE  SURGERY Left    cataracts  . HAND SURGERY Left 1992  . JOINT REPLACEMENT Bilateral 2010   bilat knees  . KNEE ARTHROSCOPY Bilateral 1960's and 70's  . PROSTATE BIOPSY    . ROTATOR CUFF REPAIR Right 2004  . TOE SURGERY Left 1970   little toe  . VASECTOMY     Family History  Problem Relation Age of Onset  . Healthy Mother   . Prostate cancer Father     Dx 51s; Deceased 64  . Prostate cancer Brother 3    currently 54  . Alcohol abuse Brother   . Bipolar disorder Brother   . Prostate cancer Paternal Uncle     unsure of age; possibly 2nd uncle also has prostate ca  . Prostate cancer Maternal Uncle     unsure of age  . Breast cancer Maternal Aunt     unsure of age  . Colon cancer Neg Hx   . Esophageal cancer Neg Hx   . Rectal cancer Neg Hx   . Stomach cancer Neg Hx    Social History   Occupational History  . Retired    Social History Main Topics  . Smoking status: Never Smoker  . Smokeless tobacco: Never Used  . Alcohol use Yes  Comment: rare  . Drug use: No  . Sexual activity: Yes    Partners: Female     Comment: Married   Tobacco Counseling Counseling given: Not Answered   Activities of Daily Living In your present state of health, do you have any difficulty performing the following activities: 12/29/2015  Hearing? Y  Vision? N  Difficulty concentrating or making decisions? N  Walking or climbing stairs? Y  Dressing or bathing? N  Doing errands, shopping? N  Preparing Food and eating ? N  Using the Toilet? N  In the past six months, have you accidently leaked urine? N  Do you have problems with loss of bowel control? N  Managing your Medications? N  Managing your Finances? N  Housekeeping or managing your Housekeeping? N  Some recent data might be hidden    Immunizations and Health Maintenance Immunization History  Administered Date(s) Administered  . Influenza, High Dose Seasonal PF 08/10/2016  . Influenza,inj,Quad PF,36+ Mos 07/20/2014    There are no preventive care reminders to display for this patient.  Patient Care Team: Chipper Herb, MD as PCP - General (Family Medicine) Celene Squibb, MD as Consulting Physician (Dermatology) Tanda Rockers, MD as Consulting Physician (Pulmonary Disease) Kathie Rhodes, MD as Consulting Physician (Urology) Rutherford Guys, MD as Consulting Physician (Ophthalmology)  Indicate any recent Medical Services you may have received from other than [facility] providers in the past year (date may be approximate).    Assessment:   This is a routine wellness examination for Ricky [name]. ***  Hearing/Vision screen No exam data present  Dietary issues and exercise activities discussed:    Goals    None     Depression Screen PHQ 2/9 Scores 11/13/2016 09/21/2016 08/28/2016 08/18/2016  PHQ - 2 Score 0 0 0 0    Fall Risk Fall Risk  11/13/2016 09/21/2016 08/28/2016 08/10/2016 12/31/2015  Falls in the past year? No Yes Yes Yes Yes  Number falls in past yr: - 2 or more 2 or more 1 2 or more  Injury with Fall? - No No No No  Risk for fall due to : - - - - -  Follow up - - - - -    Cognitive Function: MMSE - Mini Mental State Exam 09/22/2016 08/10/2016 12/29/2015  Not completed: - - Refused  Orientation to time 4 4 5   Orientation to Place 3 5 5   Registration 3 3 3   Attention/ Calculation 3 3 5   Recall 3 3 3   Language- name 2 objects 2 2 2   Language- repeat 1 1 1   Language- follow 3 step command 3 3 3   Language- read & follow direction 1 1 1   Write a sentence 1 1 1   Copy design 1 1 1   Total score 25 27 30         Screening Tests Health Maintenance  Topic Date Due  . TETANUS/TDAP  03/22/2017 (Originally 06/10/2015)  . PNA vac Low Risk Adult (1 of 2 - PCV13) 03/22/2017 (Originally 06/27/2006)  . COLON CANCER SCREENING ANNUAL FOBT  12/31/2016  . COLONOSCOPY  08/08/2018  . INFLUENZA VACCINE  Completed        Plan:   ***  During the course of the visit Ricky [name] was educated and counseled about  the following appropriate screening and preventive services:   Vaccines to include Pneumoccal, Influenza, Hepatitis B, Td, Zostavax, HCV  Electrocardiogram  Colorectal cancer screening  Cardiovascular disease screening  Diabetes screening  Glaucoma screening  Nutrition counseling  Prostate cancer screening  Smoking cessation counseling  Patient Instructions (the written plan) were given to the patient.   Denyce Robert, RN   12/20/2016

## 2016-12-20 NOTE — Patient Instructions (Addendum)
Increase exercise to 3 times per week - using stationary bike and/or dumbells  Consider installing hand rail at stairs to your kitchen  Use cane on days you feel unsteady on your feet  Review information given on Advanced Directives Thank you for seeing Ricky [name] for your Annual Wellness Visit today.     Fall Prevention in the Home Falls can cause injuries and can affect people from all age groups. There are many simple things that you can do to make your home safe and to help prevent falls. What can I do on the outside of my home?  Regularly repair the edges of walkways and driveways and fix any cracks.  Remove high doorway thresholds.  Trim any shrubbery on the main path into your home.  Use bright outdoor lighting.  Clear walkways of debris and clutter, including tools and rocks.  Regularly check that handrails are securely fastened and in good repair. Both sides of any steps should have handrails.  Install guardrails along the edges of any raised decks or porches.  Have leaves, snow, and ice cleared regularly.  Use sand or salt on walkways during winter months.  In the garage, clean up any spills right away, including grease or oil spills. What can I do in the bathroom?  Use night lights.  Install grab bars by the toilet and in the tub and shower. Do not use towel bars as grab bars.  Use non-skid mats or decals on the floor of the tub or shower.  If you need to sit down while you are in the shower, use a plastic, non-slip stool.  Keep the floor dry. Immediately clean up any water that spills on the floor.  Remove soap buildup in the tub or shower on a regular basis.  Attach bath mats securely with double-sided non-slip rug tape.  Remove throw rugs and other tripping hazards from the floor. What can I do in the bedroom?  Use night lights.  Make sure that a bedside light is easy to reach.  Do not use oversized bedding that drapes onto the floor.  Have a firm chair  that has side arms to use for getting dressed.  Remove throw rugs and other tripping hazards from the floor. What can I do in the kitchen?  Clean up any spills right away.  Avoid walking on wet floors.  Place frequently used items in easy-to-reach places.  If you need to reach for something above you, use a sturdy step stool that has a grab bar.  Keep electrical cables out of the way.  Do not use floor polish or wax that makes floors slippery. If you have to use wax, make sure that it is non-skid floor wax.  Remove throw rugs and other tripping hazards from the floor. What can I do in the stairways?  Do not leave any items on the stairs.  Make sure that there are handrails on both sides of the stairs. Fix handrails that are broken or loose. Make sure that handrails are as long as the stairways.  Check any carpeting to make sure that it is firmly attached to the stairs. Fix any carpet that is loose or worn.  Avoid having throw rugs at the top or bottom of stairways, or secure the rugs with carpet tape to prevent them from moving.  Make sure that you have a light switch at the top of the stairs and the bottom of the stairs. If you do not have them, have them installed.  What are some other fall prevention tips?  Wear closed-toe shoes that fit well and support your feet. Wear shoes that have rubber soles or low heels.  When you use a stepladder, make sure that it is completely opened and that the sides are firmly locked. Have someone hold the ladder while you are using it. Do not climb a closed stepladder.  Add color or contrast paint or tape to grab bars and handrails in your home. Place contrasting color strips on the first and last steps.  Use mobility aids as needed, such as canes, walkers, scooters, and crutches.  Turn on lights if it is dark. Replace any light bulbs that burn out.  Set up furniture so that there are clear paths. Keep the furniture in the same spot.  Fix  any uneven floor surfaces.  Choose a carpet design that does not hide the edge of steps of a stairway.  Be aware of any and all pets.  Review your medicines with your healthcare provider. Some medicines can cause dizziness or changes in blood pressure, which increase your risk of falling. Talk with your health care provider about other ways that you can decrease your risk of falls. This may include working with a physical therapist or trainer to improve your strength, balance, and endurance. This information is not intended to replace advice given to you by your health care provider. Make sure you discuss any questions you have with your health care provider. Document Released: 09/15/2002 Document Revised: 02/22/2016 Document Reviewed: 10/30/2014 Elsevier Interactive Patient Education  2017 [employer].

## 2016-12-20 NOTE — Progress Notes (Deleted)
Subjective:   Ricky [name] is a 76 y.o. male who presents for an Initial Medicare Annual Wellness Visit.  Review of Systems  ***      Objective:    There were no vitals filed for this visit. There is no height or weight on file to calculate BMI.  Current Medications (verified) Outpatient Encounter Prescriptions as of 12/20/2016  Medication Sig  . amLODipine (NORVASC) 10 MG tablet TAKE 1 BY MOUTH DAILY  . Cholecalciferol (VITAMIN D PO) Take 1 capsule by mouth daily.   Marland Kitchen donepezil (ARICEPT) 10 MG tablet TAKE 1 TABLET(10 MG) BY MOUTH AT BEDTIME  . donepezil (ARICEPT) 5 MG tablet Take 1 tablet (5 mg total) by mouth at bedtime.  Marland Kitchen doxazosin (CARDURA) 2 MG tablet TAKE 1 TABLET BY MOUTH DAILY  . FIBER FORMULA PO Take 3 capsules by mouth 2 (two) times daily.  . fluticasone (FLONASE) 50 MCG/ACT nasal spray One to 2 sprays each nostril at bedtime and stay on this medication for that at least the next 4-6 weeks  . losartan (COZAAR) 100 MG tablet TAKE 1 BY MOUTH DAILY  . potassium chloride SA (K-DUR,KLOR-CON) 20 MEQ tablet TAKE 1 TABLET BY MOUTH 3 TIMES DAILY  . tamsulosin (FLOMAX) 0.4 MG CAPS capsule TAKE ONE CAPSULE BY MOUTH TWICE DAILY  . tolterodine (DETROL LA) 4 MG 24 hr capsule TK ONE C PO QHS  . [DISCONTINUED] ALPRAZolam (XANAX) 0.5 MG tablet Take 2 tablets approximately 45 minutes prior to the MRI study, take a third tablet if needed.   No facility-administered encounter medications on file as of 12/20/2016.     Allergies (verified) Patient has no known allergies.   History: Past Medical History:  Diagnosis Date  . Allergy   . BPH (benign prostatic hyperplasia)   . Cataract   . Chronic cough   . Colon polyps   . Deviated nasal septum   . Family history of prostate cancer   . Hypertension   . Memory difficulty 09/22/2016  . Prostate CA ([place])   . Prostate cancer [facility])    Past Surgical History:  Procedure Laterality Date  . COLONOSCOPY  2011  . EYE SURGERY Right    cataracts  . EYE SURGERY Left    cataracts  . HAND SURGERY Left 1992  . JOINT REPLACEMENT Bilateral 2010   bilat knees  . KNEE ARTHROSCOPY Bilateral 1960's and 70's  . PROSTATE BIOPSY    . ROTATOR CUFF REPAIR Right 2004  . TOE SURGERY Left 1970   little toe  . VASECTOMY     Family History  Problem Relation Age of Onset  . Healthy Mother   . Prostate cancer Father     Dx 77s; Deceased 75  . Prostate cancer Brother 71    currently 32  . Alcohol abuse Brother   . Bipolar disorder Brother   . Prostate cancer Paternal Uncle     unsure of age; possibly 2nd uncle also has prostate ca  . Prostate cancer Maternal Uncle     unsure of age  . Breast cancer Maternal Aunt     unsure of age  . Colon cancer Neg Hx   . Esophageal cancer Neg Hx   . Rectal cancer Neg Hx   . Stomach cancer Neg Hx    Social History   Occupational History  . Retired    Social History Main Topics  . Smoking status: Never Smoker  . Smokeless tobacco: Never Used  . Alcohol use Yes  Comment: rare  . Drug use: No  . Sexual activity: Yes    Partners: Female     Comment: Married   Tobacco Counseling Counseling given: Not Answered   Activities of Daily Living In your present state of health, do you have any difficulty performing the following activities: 12/29/2015  Hearing? Y  Vision? N  Difficulty concentrating or making decisions? N  Walking or climbing stairs? Y  Dressing or bathing? N  Doing errands, shopping? N  Preparing Food and eating ? N  Using the Toilet? N  In the past six months, have you accidently leaked urine? N  Do you have problems with loss of bowel control? N  Managing your Medications? N  Managing your Finances? N  Housekeeping or managing your Housekeeping? N  Some recent data might be hidden    Immunizations and Health Maintenance Immunization History  Administered Date(s) Administered  . Influenza, High Dose Seasonal PF 08/10/2016  . Influenza,inj,Quad PF,36+ Mos  07/20/2014   There are no preventive care reminders to display for this patient.  Patient Care Team: Chipper Herb, MD as PCP - General (Family Medicine) Celene Squibb, MD as Consulting Physician (Dermatology) Tanda Rockers, MD as Consulting Physician (Pulmonary Disease) Kathie Rhodes, MD as Consulting Physician (Urology) Rutherford Guys, MD as Consulting Physician (Ophthalmology)  Indicate any recent Medical Services you may have received from other than [facility] providers in the past year (date may be approximate).    Assessment:   This is a routine wellness examination for Ricky [name]. ***  Hearing/Vision screen No exam data present  Dietary issues and exercise activities discussed:    Goals    None     Depression Screen PHQ 2/9 Scores 11/13/2016 09/21/2016 08/28/2016 08/18/2016  PHQ - 2 Score 0 0 0 0    Fall Risk Fall Risk  11/13/2016 09/21/2016 08/28/2016 08/10/2016 12/31/2015  Falls in the past year? No Yes Yes Yes Yes  Number falls in past yr: - 2 or more 2 or more 1 2 or more  Injury with Fall? - No No No No  Risk for fall due to : - - - - -  Follow up - - - - -    Cognitive Function: MMSE - Mini Mental State Exam 09/22/2016 08/10/2016 12/29/2015  Not completed: - - Refused  Orientation to time 4 4 5   Orientation to Place 3 5 5   Registration 3 3 3   Attention/ Calculation 3 3 5   Recall 3 3 3   Language- name 2 objects 2 2 2   Language- repeat 1 1 1   Language- follow 3 step command 3 3 3   Language- read & follow direction 1 1 1   Write a sentence 1 1 1   Copy design 1 1 1   Total score 25 27 30         Screening Tests Health Maintenance  Topic Date Due  . TETANUS/TDAP  03/22/2017 (Originally 06/10/2015)  . PNA vac Low Risk Adult (1 of 2 - PCV13) 03/22/2017 (Originally 06/27/2006)  . COLON CANCER SCREENING ANNUAL FOBT  12/31/2016  . COLONOSCOPY  08/08/2018  . INFLUENZA VACCINE  Completed        Plan:   ***  During the course of the visit Ricky [name] was educated and  counseled about the following appropriate screening and preventive services:   Vaccines to include Pneumoccal, Influenza, Hepatitis B, Td, Zostavax, HCV  Electrocardiogram  Colorectal cancer screening  Cardiovascular disease screening  Diabetes screening  Glaucoma screening  Nutrition counseling  Prostate cancer screening  Smoking cessation counseling  Patient Instructions (the written plan) were given to the patient.   Denyce Robert, RN   12/20/2016

## 2016-12-20 NOTE — Progress Notes (Deleted)
Subjective:   Ricky [name] is a 76 y.o. male who presents for an Initial Medicare Annual Wellness Visit.       Objective:    Today's Vitals   12/20/16 1242  BP: (!) 143/82  Pulse: (!) 58  Weight: 204 lb (92.5 kg)  Height: 5' 5.5" (1.664 m)   Body mass index is 33.43 kg/m.  Current Medications (verified) Outpatient Encounter Prescriptions as of 12/20/2016  Medication Sig  . amLODipine (NORVASC) 10 MG tablet TAKE 1 BY MOUTH DAILY  . Cholecalciferol (VITAMIN D PO) Take 1 capsule by mouth daily.   Marland Kitchen donepezil (ARICEPT) 10 MG tablet TAKE 1 TABLET(10 MG) BY MOUTH AT BEDTIME  . donepezil (ARICEPT) 5 MG tablet Take 1 tablet (5 mg total) by mouth at bedtime.  Marland Kitchen doxazosin (CARDURA) 2 MG tablet TAKE 1 TABLET BY MOUTH DAILY  . FIBER FORMULA PO Take 3 capsules by mouth 2 (two) times daily.  . fluticasone (FLONASE) 50 MCG/ACT nasal spray One to 2 sprays each nostril at bedtime and stay on this medication for that at least the next 4-6 weeks  . losartan (COZAAR) 100 MG tablet TAKE 1 BY MOUTH DAILY  . potassium chloride SA (K-DUR,KLOR-CON) 20 MEQ tablet TAKE 1 TABLET BY MOUTH 3 TIMES DAILY  . tamsulosin (FLOMAX) 0.4 MG CAPS capsule TAKE ONE CAPSULE BY MOUTH TWICE DAILY  . tolterodine (DETROL LA) 4 MG 24 hr capsule TK ONE C PO QHS  . [DISCONTINUED] ALPRAZolam (XANAX) 0.5 MG tablet Take 2 tablets approximately 45 minutes prior to the MRI study, take a third tablet if needed.   No facility-administered encounter medications on file as of 12/20/2016.     Allergies (verified) Patient has no known allergies.   History: Past Medical History:  Diagnosis Date  . Allergy   . BPH (benign prostatic hyperplasia)   . Cataract   . Chronic cough   . Colon polyps   . Deviated nasal septum   . Family history of prostate cancer   . Hypertension   . Memory difficulty 09/22/2016  . Prostate CA ([place])   . Prostate cancer [facility])    Past Surgical History:  Procedure Laterality Date  . COLONOSCOPY   2011  . EYE SURGERY Right    cataracts  . EYE SURGERY Left    cataracts  . HAND SURGERY Left 1992  . JOINT REPLACEMENT Bilateral 2010   bilat knees  . KNEE ARTHROSCOPY Bilateral 1960's and 70's  . PROSTATE BIOPSY    . ROTATOR CUFF REPAIR Right 2004  . TOE SURGERY Left 1970   little toe  . VASECTOMY     Family History  Problem Relation Age of Onset  . Healthy Mother   . Prostate cancer Father     Dx 83s; Deceased 66  . Prostate cancer Brother 65    currently 30  . Alcohol abuse Brother   . Bipolar disorder Brother   . Prostate cancer Paternal Uncle     unsure of age; possibly 2nd uncle also has prostate ca  . Prostate cancer Maternal Uncle     unsure of age  . Breast cancer Maternal Aunt     unsure of age  . Colon cancer Neg Hx   . Esophageal cancer Neg Hx   . Rectal cancer Neg Hx   . Stomach cancer Neg Hx    Social History   Occupational History  . Retired    Social History Main Topics  . Smoking status: Never Smoker  .  Smokeless tobacco: Never Used  . Alcohol use Yes     Comment: rare  . Drug use: No  . Sexual activity: Yes    Partners: Female     Comment: Married   Tobacco Counseling Counseling given: Not Answered   Activities of Daily Living In your present state of health, do you have any difficulty performing the following activities: 12/29/2015  Hearing? Y  Vision? N  Difficulty concentrating or making decisions? N  Walking or climbing stairs? Y  Dressing or bathing? N  Doing errands, shopping? N  Preparing Food and eating ? N  Using the Toilet? N  In the past six months, have you accidently leaked urine? N  Do you have problems with loss of bowel control? N  Managing your Medications? N  Managing your Finances? N  Housekeeping or managing your Housekeeping? N  Some recent data might be hidden    Immunizations and Health Maintenance Immunization History  Administered Date(s) Administered  . Influenza, High Dose Seasonal PF 08/10/2016  .  Influenza,inj,Quad PF,36+ Mos 07/20/2014   There are no preventive care reminders to display for this patient.  Patient Care Team: Chipper Herb, MD as PCP - General (Family Medicine) Celene Squibb, MD as Consulting Physician (Dermatology) Tanda Rockers, MD as Consulting Physician (Pulmonary Disease) Kathie Rhodes, MD as Consulting Physician (Urology) Rutherford Guys, MD as Consulting Physician (Ophthalmology)  Indicate any recent Medical Services you may have received from other than [facility] providers in the past year (date may be approximate).    Assessment:   This is a routine wellness examination for Ricky [name]. ***  Hearing/Vision screen No exam data present  Dietary issues and exercise activities discussed:    Goals    None     Depression Screen PHQ 2/9 Scores 11/13/2016 09/21/2016 08/28/2016 08/18/2016  PHQ - 2 Score 0 0 0 0    Fall Risk Fall Risk  11/13/2016 09/21/2016 08/28/2016 08/10/2016 12/31/2015  Falls in the past year? No Yes Yes Yes Yes  Number falls in past yr: - 2 or more 2 or more 1 2 or more  Injury with Fall? - No No No No  Risk for fall due to : - - - - -  Follow up - - - - -    Cognitive Function: MMSE - Mini Mental State Exam 09/22/2016 08/10/2016 12/29/2015  Not completed: - - Refused  Orientation to time 4 4 5   Orientation to Place 3 5 5   Registration 3 3 3   Attention/ Calculation 3 3 5   Recall 3 3 3   Language- name 2 objects 2 2 2   Language- repeat 1 1 1   Language- follow 3 step command 3 3 3   Language- read & follow direction 1 1 1   Write a sentence 1 1 1   Copy design 1 1 1   Total score 25 27 30         Screening Tests Health Maintenance  Topic Date Due  . TETANUS/TDAP  03/22/2017 (Originally 06/10/2015)  . PNA vac Low Risk Adult (1 of 2 - PCV13) 03/22/2017 (Originally 06/27/2006)  . COLON CANCER SCREENING ANNUAL FOBT  12/31/2016  . COLONOSCOPY  08/08/2018  . INFLUENZA VACCINE  Completed        Plan:   ***  During the course of the visit  Ricky [name] was educated and counseled about the following appropriate screening and preventive services:   Vaccines to include Pneumoccal, Influenza, Hepatitis B, Td, Zostavax, HCV  Electrocardiogram  Colorectal cancer screening  Cardiovascular disease screening  Diabetes screening  Glaucoma screening  Nutrition counseling  Prostate cancer screening  Smoking cessation counseling  Patient Instructions (the written plan) were given to the patient.   Denyce Robert, RN   12/20/2016

## 2016-12-21 ENCOUNTER — Other Ambulatory Visit: Payer: Self-pay | Admitting: Family Medicine

## 2016-12-22 NOTE — Telephone Encounter (Signed)
Patient aware.

## 2016-12-22 NOTE — Telephone Encounter (Signed)
eep tudy ordered

## 2016-12-22 NOTE — Telephone Encounter (Signed)
Sleep study ordered

## 2016-12-28 DIAGNOSIS — C61 Malignant neoplasm of prostate: Secondary | ICD-10-CM | POA: Diagnosis not present

## 2016-12-28 DIAGNOSIS — R351 Nocturia: Secondary | ICD-10-CM | POA: Diagnosis not present

## 2016-12-28 DIAGNOSIS — N3281 Overactive bladder: Secondary | ICD-10-CM | POA: Diagnosis not present

## 2016-12-28 DIAGNOSIS — N401 Enlarged prostate with lower urinary tract symptoms: Secondary | ICD-10-CM | POA: Diagnosis not present

## 2016-12-29 ENCOUNTER — Ambulatory Visit: Payer: Self-pay | Admitting: Genetic Counselor

## 2016-12-29 ENCOUNTER — Encounter: Payer: Self-pay | Admitting: Genetic Counselor

## 2016-12-29 DIAGNOSIS — D225 Melanocytic nevi of trunk: Secondary | ICD-10-CM | POA: Diagnosis not present

## 2016-12-29 DIAGNOSIS — Z1379 Encounter for other screening for genetic and chromosomal anomalies: Secondary | ICD-10-CM

## 2016-12-29 DIAGNOSIS — C61 Malignant neoplasm of prostate: Secondary | ICD-10-CM

## 2016-12-29 DIAGNOSIS — L821 Other seborrheic keratosis: Secondary | ICD-10-CM | POA: Diagnosis not present

## 2016-12-29 DIAGNOSIS — Z803 Family history of malignant neoplasm of breast: Secondary | ICD-10-CM

## 2016-12-29 DIAGNOSIS — L814 Other melanin hyperpigmentation: Secondary | ICD-10-CM | POA: Diagnosis not present

## 2016-12-29 DIAGNOSIS — L570 Actinic keratosis: Secondary | ICD-10-CM | POA: Diagnosis not present

## 2016-12-29 DIAGNOSIS — Z8042 Family history of malignant neoplasm of prostate: Secondary | ICD-10-CM

## 2016-12-29 NOTE — Progress Notes (Signed)
HPI: Mr. Vanhorn was previously seen in the [place] clinic due to a personal and family history of cancer and concerns regarding a hereditary predisposition to cancer. Please refer to our prior cancer genetics clinic note for more information regarding Mr. Lavallee medical, social and family histories, and our assessment and recommendations, at the time. Mr. Burbach recent genetic test results were disclosed to him, as were recommendations warranted by these results. These results and recommendations are discussed in more detail below.  CANCER HISTORY:    Malignant neoplasm prostate ([place])   11/13/2016 Initial Diagnosis    Malignant neoplasm prostate ([place])     12/27/2016 Genetic Testing    Negative genetic testing on the common Hereditary cancer panel.  The Hereditary Gene Panel offered by [facility] includes sequencing and/or deletion duplication testing of the following 43 genes: APC, ATM, AXIN2, BARD1, BMPR1A, BRCA1, BRCA2, BRIP1, CDH1, CDKN2A (p14ARF), CDKN2A (p16INK4a), CHEK2, DICER1, EPCAM (Deletion/duplication testing only), GREM1 (promoter region deletion/duplication testing only), KIT, MEN1, MLH1, MSH2, MSH6, MUTYH, NBN, NF1, PALB2, PDGFRA, PMS2, POLD1, POLE, PTEN, RAD50, RAD51C, RAD51D, SDHB, SDHC, SDHD, SMAD4, SMARCA4. STK11, TP53, TSC1, TSC2, and VHL.  The following gene was evaluated for sequence changes only: SDHA and HOXB13 c.251G>A variant only.  The report date is December 27, 2016.        FAMILY HISTORY:  We obtained a detailed, 4-generation family history.  Significant diagnoses are listed below: Family History  Problem Relation Age of Onset  . Healthy Mother   . Prostate cancer Father     Dx 67s; Deceased 67  . Prostate cancer Brother 68    currently 68  . Alcohol abuse Brother   . Bipolar disorder Brother   . Prostate cancer Paternal Uncle     unsure of age; possibly 2nd uncle also has prostate ca  . Prostate cancer Maternal Uncle     unsure of age  . Breast  cancer Maternal Aunt     unsure of age  . Colon cancer Neg Hx   . Esophageal cancer Neg Hx   . Rectal cancer Neg Hx   . Stomach cancer Neg Hx     Mr. Wix is unaware of previous family history of genetic testing for hereditary cancer risks. Patient's maternal ancestors are of Caucasian descent, and paternal ancestors are of Caucasian descent. There in no reported Ashkenazi Jewish ancestry. There is no known consanguinity.  GENETIC TEST RESULTS: Genetic testing reported out on December 27, 2016 through the Common Hereditary cancer panel found no deleterious mutations.  The Hereditary Gene Panel offered by [facility] includes sequencing and/or deletion duplication testing of the following 43 genes: APC, ATM, AXIN2, BARD1, BMPR1A, BRCA1, BRCA2, BRIP1, CDH1, CDKN2A (p14ARF), CDKN2A (p16INK4a), CHEK2, DICER1, EPCAM (Deletion/duplication testing only), GREM1 (promoter region deletion/duplication testing only), KIT, MEN1, MLH1, MSH2, MSH6, MUTYH, NBN, NF1, PALB2, PDGFRA, PMS2, POLD1, POLE, PTEN, RAD50, RAD51C, RAD51D, SDHB, SDHC, SDHD, SMAD4, SMARCA4. STK11, TP53, TSC1, TSC2, and VHL.  The following gene was evaluated for sequence changes only: SDHA and HOXB13 c.251G>A variant only.  The test report has been scanned into EPIC and is located under the Molecular Pathology section of the Results Review tab.   We discussed with Mr. Inclan that since the current genetic testing is not perfect, it is possible there may be a gene mutation in one of these genes that current testing cannot detect, but that chance is small. We also discussed, that it is possible that another gene that has not yet been  discovered, or that we have not yet tested, is responsible for the cancer diagnoses in the family, and it is, therefore, important to remain in touch with cancer genetics in the future so that we can continue to offer Mr. Dave the most up to date genetic testing.   CANCER SCREENING RECOMMENDATIONS: This result is reassuring and  indicates that Mr. Mabe likely does not have an increased risk for a future cancer due to a mutation in one of these genes. This normal test also suggests that Mr. Bjorkman cancer was most likely not due to an inherited predisposition associated with one of these genes.  Most cancers happen by chance and this negative test suggests that his cancer falls into this category.  We, therefore, recommended he continue to follow the cancer management and screening guidelines provided by his oncology and primary healthcare provider.   RECOMMENDATIONS FOR FAMILY MEMBERS: Women in this family might be at some increased risk of developing cancer, over the general population risk, simply due to the family history of cancer. We recommended women in this family have a yearly mammogram beginning at age 58, or 66 years younger than the earliest onset of cancer, an annual clinical breast exam, and perform monthly breast self-exams. Women in this family should also have a gynecological exam as recommended by their primary provider. All family members should have a colonoscopy by age 95.  FOLLOW-UP: Lastly, we discussed with Mr. Yuan that cancer genetics is a rapidly advancing field and it is possible that new genetic tests will be appropriate for him and/or his family members in the future. We encouraged him to remain in contact with cancer genetics on an annual basis so we can update his personal and family histories and let him know of advances in cancer genetics that may benefit this family.   Our contact number was provided. Mr. Schellhorn questions were answered to his satisfaction, and he knows he is welcome to call us at anytime with additional questions or concerns.   Roma Kayser, MS, [facility] Certified Genetic Counselor [email or web address]

## 2017-01-03 ENCOUNTER — Ambulatory Visit: Payer: Medicare Other

## 2017-01-09 DIAGNOSIS — N410 Acute prostatitis: Secondary | ICD-10-CM | POA: Diagnosis not present

## 2017-01-09 DIAGNOSIS — D2932 Benign neoplasm of left epididymis: Secondary | ICD-10-CM | POA: Diagnosis not present

## 2017-01-10 ENCOUNTER — Encounter (HOSPITAL_COMMUNITY): Payer: Self-pay

## 2017-02-07 ENCOUNTER — Other Ambulatory Visit: Payer: Self-pay | Admitting: Family Medicine

## 2017-02-07 MED ORDER — POTASSIUM CHLORIDE CRYS ER 20 MEQ PO TBCR: 20.0000 meq | EXTENDED_RELEASE_TABLET | Freq: Three times a day (TID) | ORAL | 0 refills | Status: DC

## 2017-02-07 NOTE — Telephone Encounter (Signed)
Patient notified that 1 month refill was sent for potassium chloride.  Advised him to make sure to keep his follow up appointment with Dr. Laurance Flatten on 02/26/17.  Patient agreeable.

## 2017-02-12 ENCOUNTER — Ambulatory Visit: Payer: Medicare Other | Attending: Nurse Practitioner | Admitting: Neurology

## 2017-02-12 DIAGNOSIS — G4761 Periodic limb movement disorder: Secondary | ICD-10-CM | POA: Insufficient documentation

## 2017-02-12 DIAGNOSIS — I493 Ventricular premature depolarization: Secondary | ICD-10-CM | POA: Diagnosis not present

## 2017-02-12 DIAGNOSIS — R5383 Other fatigue: Secondary | ICD-10-CM | POA: Diagnosis not present

## 2017-02-12 DIAGNOSIS — Z79899 Other long term (current) drug therapy: Secondary | ICD-10-CM | POA: Diagnosis not present

## 2017-02-12 DIAGNOSIS — R0683 Snoring: Secondary | ICD-10-CM | POA: Diagnosis not present

## 2017-02-12 DIAGNOSIS — G4733 Obstructive sleep apnea (adult) (pediatric): Secondary | ICD-10-CM | POA: Diagnosis not present

## 2017-02-13 ENCOUNTER — Ambulatory Visit: Payer: Medicare Other | Admitting: Family Medicine

## 2017-02-18 NOTE — Procedures (Signed)
Mathews A. Merlene Laughter, MD     [email or web address]             NOCTURNAL POLYSOMNOGRAPHY   LOCATION: [facility]  Patient Name: Ricky, [name] Date: 02/12/2017 Gender: Male D.O.B: August 09, 1941 Age (years): 21 Referring Provider: Ronnald Collum Height (inches): 67 Interpreting Physician: Phillips Odor MD, ABSM Weight (lbs): 203 RPSGT: Rosebud Poles BMI: 32 MRN: [id number] Neck Size: 17.00 CLINICAL INFORMATION Sleep Study Type: NPSG  Indication for sleep study: Fatigue  Epworth Sleepiness Score: 11  SLEEP STUDY TECHNIQUE As per the AASM Manual for the Scoring of Sleep and Associated Events v2.3 (April 2016) with a hypopnea requiring 4% desaturations.  The channels recorded and monitored were frontal, central and occipital EEG, electrooculogram (EOG), submentalis EMG (chin), nasal and oral airflow, thoracic and abdominal wall motion, anterior tibialis EMG, snore microphone, electrocardiogram, and pulse oximetry.  MEDICATIONS Medications self-administered by patient taken the night of the study : N/A  Current Outpatient Prescriptions:  .  amLODipine (NORVASC) 10 MG tablet, TAKE 1 BY MOUTH DAILY, Disp: 90 tablet, Rfl: 3 .  Cholecalciferol (VITAMIN D PO), Take 1 capsule by mouth daily. , Disp: , Rfl:  .  donepezil (ARICEPT) 10 MG tablet, TAKE 1 TABLET(10 MG) BY MOUTH AT BEDTIME, Disp: 90 tablet, Rfl: 2 .  doxazosin (CARDURA) 2 MG tablet, TAKE 1 TABLET BY MOUTH DAILY, Disp: 90 tablet, Rfl: 0 .  FIBER FORMULA PO, Take 3 capsules by mouth 2 (two) times daily., Disp: , Rfl:  .  fluticasone (FLONASE) 50 MCG/ACT nasal spray, One to 2 sprays each nostril at bedtime and stay on this medication for that at least the next 4-6 weeks, Disp: 16 g, Rfl: 6 .  losartan (COZAAR) 100 MG tablet, TAKE 1 TABLET BY MOUTH DAILY, Disp: 90 tablet, Rfl: 0 .  potassium chloride SA (K-DUR,KLOR-CON) 20 MEQ tablet, TAKE 1 TABLET(20 MEQ) BY MOUTH THREE TIMES DAILY, Disp: 270 tablet, Rfl:  0 .  tamsulosin (FLOMAX) 0.4 MG CAPS capsule, TAKE ONE CAPSULE BY MOUTH TWICE DAILY, Disp: 180 capsule, Rfl: 0 .  tolterodine (DETROL LA) 4 MG 24 hr capsule, TK ONE C PO QHS, Disp: , Rfl: 10   SLEEP ARCHITECTURE The study was initiated at 10:10:12 PM and ended at 4:39:36 AM.  Sleep onset time was 10.3 minutes and the sleep efficiency was 78.0%. The total sleep time was 303.6 minutes.  Stage REM latency was 157.5 minutes.  The patient spent 2.14% of the night in stage N1 sleep, 45.16% in stage N2 sleep, 33.60% in stage N3 and 19.10% in REM.  Alpha intrusion was absent.  Supine sleep was 21.25%.  RESPIRATORY PARAMETERS The overall apnea/hypopnea index (AHI) was 19.2 per hour. There were 32 total apneas, including 15 obstructive, 3 central and 14 mixed apneas. There were 65 hypopneas and 2 RERAs.  The AHI during Stage REM sleep was 24.8 per hour.  AHI while supine was 16.7 per hour.  The mean oxygen saturation was 93.38%. The minimum SpO2 during sleep was 87.00%.  Moderate snoring was noted during this study.  CARDIAC DATA The 2 lead EKG demonstrated sinus rhythm. The mean heart rate was N/A beats per minute. Other EKG findings include: PVCs. LEG MOVEMENT DATA The total PLMS were 39 with a resulting PLMS index of 7.71. Associated arousal with leg movement index was 1.8.  IMPRESSIONS - Moderate obstructive sleep apnea is noted. A formal CPAP titration study is recommended.  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 No  significant associated arousals.   Delano Metz, MD Diplomate, American Board of Sleep Medicine.   ELECTRONICALLY SIGNED ON:  02/18/2017, 10:23 PM [place] PH: [phone]   FX: [phone] [place]

## 2017-02-26 ENCOUNTER — Ambulatory Visit (INDEPENDENT_AMBULATORY_CARE_PROVIDER_SITE_OTHER): Payer: Medicare Other | Admitting: Family Medicine

## 2017-02-26 ENCOUNTER — Encounter: Payer: Self-pay | Admitting: Family Medicine

## 2017-02-26 VITALS — BP 104/69 | HR 69 | Temp 97.2°F | Ht 65.5 in | Wt 200.0 lb

## 2017-02-26 DIAGNOSIS — E559 Vitamin D deficiency, unspecified: Secondary | ICD-10-CM

## 2017-02-26 DIAGNOSIS — E785 Hyperlipidemia, unspecified: Secondary | ICD-10-CM

## 2017-02-26 DIAGNOSIS — G4730 Sleep apnea, unspecified: Secondary | ICD-10-CM

## 2017-02-26 DIAGNOSIS — I10 Essential (primary) hypertension: Secondary | ICD-10-CM

## 2017-02-26 DIAGNOSIS — R413 Other amnesia: Secondary | ICD-10-CM

## 2017-02-26 DIAGNOSIS — C61 Malignant neoplasm of prostate: Secondary | ICD-10-CM

## 2017-02-26 DIAGNOSIS — J301 Allergic rhinitis due to pollen: Secondary | ICD-10-CM | POA: Diagnosis not present

## 2017-02-26 DIAGNOSIS — J45991 Cough variant asthma: Secondary | ICD-10-CM

## 2017-02-26 NOTE — Patient Instructions (Addendum)
Medicare Annual Wellness Visit  [facility] and the medical providers at [facility] strive to bring you the best medical care.  In doing so we not only want to address your current medical conditions and concerns but also to detect new conditions early and prevent illness, disease and health-related problems.    Medicare offers a yearly Wellness Visit which allows our clinical staff to assess your need for preventative services including immunizations, lifestyle education, counseling to decrease risk of preventable diseases and screening for fall risk and other medical concerns.    This visit is provided free of charge (no copay) for all Medicare recipients. The clinical pharmacists at [facility] have begun to conduct these Wellness Visits which will also include a thorough review of all your medications.    As you primary medical provider recommend that you make an appointment for your Annual Wellness Visit if you have not done so already this year.  You may set up this appointment before you leave today or you may call back [phone]) and schedule an appointment.  Please make sure when you call that you mention that you are scheduling your Annual Wellness Visit with the clinical pharmacist so that the appointment may be made for the proper length of time.     Continue current medications. Continue good therapeutic lifestyle changes which include good diet and exercise. Fall precautions discussed with patient. If an FOBT was given today- please return it to our front desk. If you are over 15 years old - you may need Prevnar 43 or the adult Pneumonia vaccine.  **Flu shots are available--- please call and schedule a FLU-CLINIC appointment**  After your visit with [place] today you will receive a survey in the mail or online from [employer] regarding your care with [place]. Please take a moment to fill this out. Your feedback is very  important to [place] as you can help [place] better understand your patient needs as well as improve your experience and satisfaction. WE CARE ABOUT YOU!!!   We will check with neurologist regarding her prescription and for equipment for CPAP and let you know about this Follow-up with urology as planned Continue with Nexium Try Flonase 1 spray each nostril at bedtime, this is over-the-counter. This may help nasal breathing.

## 2017-02-26 NOTE — Progress Notes (Signed)
Subjective:    Patient ID: Ricky [name], male    DOB: 10/28/1940, 76 y.o.   MRN: [id number]  HPI Pt here for follow up and management of chronic medical problems which includes hyperlipidemia and hypertension. He is taking medication regularly.The patient is doing well overall. He has had sleep studies which showed that he has sleep apnea he is uncertain as to wear the equipment for this will come from and we will look into this further. He denies any chest pain or shortness of breath and in fact says his breathing may be some better. He denies any trouble with swallowing but is taking Nexium now and thinks this may have helped he denies any problems with blood in the stool or black tarry bowel movements. He is passing his water well but does have prostate cancer and is to return to see the urologist tomorrow for the consideration of seed implants. He will also return to the office for fasting lab work.     Patient Active Problem List   Diagnosis Date Noted  . Genetic testing 12/29/2016  . Family history of breast cancer 12/13/2016  . Family history of prostate cancer   . Malignant neoplasm prostate ([place]) 11/13/2016  . Memory difficulty 09/22/2016  . Headache syndrome 09/22/2016  . Cough variant asthma vs UACS  11/12/2015  . Hyperlipidemia 10/15/2015  . Vitamin D deficiency 03/12/2014  . BPH (benign prostatic hyperplasia) 10/01/2013  . Erectile dysfunction 10/01/2013  . PERSONAL HX COLONIC POLYPS 01/25/2010  . 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 Essential hypertension 01/13/2009   Outpatient Encounter Prescriptions as of 02/26/2017  Medication Sig  . amLODipine (NORVASC) 10 MG tablet TAKE 1 BY MOUTH DAILY  . Cholecalciferol (VITAMIN D PO) Take 1 capsule by mouth daily.   Marland Kitchen donepezil (ARICEPT) 10 MG tablet TAKE 1 TABLET(10 MG) BY MOUTH AT BEDTIME  . doxazosin (CARDURA) 2 MG tablet TAKE 1 TABLET BY MOUTH DAILY  . FIBER FORMULA PO Take 3 capsules by mouth 2 (two) times  daily.  . fluticasone (FLONASE) 50 MCG/ACT nasal spray One to 2 sprays each nostril at bedtime and stay on this medication for that at least the next 4-6 weeks  . losartan (COZAAR) 100 MG tablet TAKE 1 TABLET BY MOUTH DAILY  . potassium chloride SA (K-DUR,KLOR-CON) 20 MEQ tablet TAKE 1 TABLET(20 MEQ) BY MOUTH THREE TIMES DAILY  . tamsulosin (FLOMAX) 0.4 MG CAPS capsule TAKE ONE CAPSULE BY MOUTH TWICE DAILY  . tolterodine (DETROL LA) 4 MG 24 hr capsule TK ONE C PO QHS  . meloxicam (MOBIC) 15 MG tablet Take 1 tablet by mouth daily.   No facility-administered encounter medications on file as of 02/26/2017.       Review of Systems  Constitutional: Negative.   HENT: Negative.   Eyes: Negative.   Respiratory: Negative.   Cardiovascular: Negative.   Gastrointestinal: Negative.   Endocrine: Negative.   Genitourinary: Negative.   Musculoskeletal: Negative.   Skin: Negative.   Allergic/Immunologic: Negative.   Neurological: Negative.   Hematological: Negative.   Psychiatric/Behavioral: Negative.        Objective:   Physical Exam  Constitutional: He is oriented to person, place, and time. He appears well-developed and well-nourished. No distress.  HENT:  Head: Normocephalic and atraumatic.  Right Ear: External ear normal.  Left Ear: External ear normal.  Mouth/Throat: Oropharynx is clear and moist. No oropharyngeal exudate.  Nasal turbinate congestion right greater than left  Eyes: Conjunctivae and EOM are normal.  Pupils are equal, round, and reactive to light. Right eye exhibits no discharge. Left eye exhibits no discharge. No scleral icterus.  Neck: Normal range of motion. Neck supple. No thyromegaly present.  No bruits audible today. No thyromegaly or anterior cervical adenopathy  Cardiovascular: Normal rate, regular rhythm and normal heart sounds.   No murmur heard. Pedal pulses on the right lower extremity were difficult to palpate. Heart is regular at 72/m  Pulmonary/Chest:  Effort normal and breath sounds normal. No respiratory distress. He has no wheezes. He has no rales. He exhibits no tenderness.  No axillary adenopathy  Abdominal: Soft. Bowel sounds are normal. He exhibits no mass. There is no tenderness. There is no rebound and no guarding.  There is an umbilical hernia. Minimal abdominal tenderness with palpation no liver or spleen enlargement and good inguinal pulses  Genitourinary: Rectum normal.  Genitourinary Comments: This is deferred to his urologist whom he is seeing tomorrow. He was told that he had a small right inguinal hernia that has not changed any.  Musculoskeletal: Normal range of motion. He exhibits no edema.  Bilateral knee replacement with good mobility bilaterally.  Lymphadenopathy:    He has no cervical adenopathy.  Neurological: He is alert and oriented to person, place, and time. He has normal reflexes. No cranial nerve deficit.  Skin: Skin is warm and dry. No rash noted.  Psychiatric: He has a normal mood and affect. His behavior is normal. Judgment and thought content normal.  Nursing note and vitals reviewed.  BP 104/69 (BP Location: Left Arm)   Pulse 69   Temp 97.2 F (36.2 C) (Oral)   Ht 5' 5.5" (1.664 m)   Wt 200 lb (90.7 kg)   BMI 32.78 kg/m         Assessment & Plan:  1. Hyperlipidemia with target LDL less than 100 -Continue with aggressive therapeutic lifestyle changes and current treatment pending results of lab work - CBC with Differential/Platelet; Future - NMR, lipoprofile; Future  2. Essential hypertension -Continue with current treatment as blood pressure is good today. - CBC with Differential/Platelet; Future - BMP8+EGFR; Future - Hepatic function panel; Future  3. Vitamin D deficiency -Continue with vitamin D replacement pending results of lab work - CBC with Differential/Platelet; Future - VITAMIN D 25 Hydroxy (Vit-D Deficiency, Fractures); Future  4. Memory impairment -This appears to be  stable.  5. Sleep apnea, unspecified type -Check with neurologist concerning the patient's need for equipment and who will prescribe this.  6. Prostate cancer [facility]) -Follow-up with urology as planned  7. Non-seasonal allergic rhinitis due to pollen -Consider Flonase 1 spray each nostril daily  8. Cough variant asthma vs UACS  -Continue with Nexium  Patient Instructions                       Medicare Annual Wellness Visit  [facility] and the medical providers at [facility] strive to bring you the best medical care.  In doing so we not only want to address your current medical conditions and concerns but also to detect new conditions early and prevent illness, disease and health-related problems.    Medicare offers a yearly Wellness Visit which allows our clinical staff to assess your need for preventative services including immunizations, lifestyle education, counseling to decrease risk of preventable diseases and screening for fall risk and other medical concerns.    This visit is provided free of charge (no copay) for all Medicare recipients. The  clinical pharmacists at [facility] have begun to conduct these Wellness Visits which will also include a thorough review of all your medications.    As you primary medical provider recommend that you make an appointment for your Annual Wellness Visit if you have not done so already this year.  You may set up this appointment before you leave today or you may call back ([phone]) and schedule an appointment.  Please make sure when you call that you mention that you are scheduling your Annual Wellness Visit with the clinical pharmacist so that the appointment may be made for the proper length of time.     Continue current medications. Continue good therapeutic lifestyle changes which include good diet and exercise. Fall precautions discussed with patient. If an FOBT was given today- please return  it to our front desk. If you are over 42 years old - you may need Prevnar 45 or the adult Pneumonia vaccine.  **Flu shots are available--- please call and schedule a FLU-CLINIC appointment**  After your visit with [place] today you will receive a survey in the mail or online from [employer] regarding your care with [place]. Please take a moment to fill this out. Your feedback is very important to [place] as you can help [place] better understand your patient needs as well as improve your experience and satisfaction. WE CARE ABOUT YOU!!!   We will check with neurologist regarding her prescription and for equipment for CPAP and let you know about this Follow-up with urology as planned Continue with Nexium Try Flonase 1 spray each nostril at bedtime, this is over-the-counter. This may help nasal breathing.  Arrie Senate MD

## 2017-03-01 DIAGNOSIS — N448 Other noninflammatory disorders of the testis: Secondary | ICD-10-CM | POA: Diagnosis not present

## 2017-03-01 DIAGNOSIS — R351 Nocturia: Secondary | ICD-10-CM | POA: Diagnosis not present

## 2017-03-01 DIAGNOSIS — N401 Enlarged prostate with lower urinary tract symptoms: Secondary | ICD-10-CM | POA: Diagnosis not present

## 2017-03-01 DIAGNOSIS — C61 Malignant neoplasm of prostate: Secondary | ICD-10-CM | POA: Diagnosis not present

## 2017-03-06 ENCOUNTER — Other Ambulatory Visit: Payer: Self-pay | Admitting: Family Medicine

## 2017-03-16 ENCOUNTER — Telehealth: Payer: Self-pay | Admitting: *Deleted

## 2017-03-16 NOTE — Telephone Encounter (Signed)
Mild obstructive sleep apnea- needs Cpap titration- do not know how to order. I though [facility] wold contact patient sine he did sleep study

## 2017-03-16 NOTE — Telephone Encounter (Signed)
Pt upset that he hasnt heard from his sleep study. He says he has called here about this 3 times. I do not see anything. Please read his sleep study and send me an order for what is needed and I will order

## 2017-03-20 ENCOUNTER — Other Ambulatory Visit: Payer: Self-pay

## 2017-03-20 ENCOUNTER — Other Ambulatory Visit: Payer: Self-pay | Admitting: Family Medicine

## 2017-03-20 DIAGNOSIS — G4730 Sleep apnea, unspecified: Secondary | ICD-10-CM

## 2017-03-20 NOTE — Telephone Encounter (Signed)
Spoke with Ricky [name] and let him know that we are waiting on a return call from  [place] at  [facility]. Ricky [name] will be in town tomorrow -Friday and would like to be able to pick up his CPAP while here. We will call him when this is ready.

## 2017-03-23 ENCOUNTER — Encounter: Payer: Self-pay | Admitting: Neurology

## 2017-03-23 ENCOUNTER — Ambulatory Visit (INDEPENDENT_AMBULATORY_CARE_PROVIDER_SITE_OTHER): Payer: Medicare Other | Admitting: Neurology

## 2017-03-23 VITALS — BP 132/78 | HR 64 | Ht 65.5 in | Wt 198.5 lb

## 2017-03-23 DIAGNOSIS — G4762 Sleep related leg cramps: Secondary | ICD-10-CM

## 2017-03-23 DIAGNOSIS — R413 Other amnesia: Secondary | ICD-10-CM

## 2017-03-23 DIAGNOSIS — G4489 Other headache syndrome: Secondary | ICD-10-CM | POA: Diagnosis not present

## 2017-03-23 HISTORY — DX: Sleep related leg cramps: G47.62

## 2017-03-23 NOTE — Progress Notes (Signed)
Reason for visit: Memory disorder  Ricky [name] is an 76 y.o. male  History of present illness:  [name] is a 76 year old right-handed white male with a history of a mild memory disorder. The patient has been placed on Aricept, he thinks that this is causing some troubles with gait instability and dizziness, but he believes that his memory has improved on the medication. The patient is not having any nausea or vomiting or diarrhea. He recently was diagnosed with prostate cancer, he is on hormone therapy, he is having hot flashes because of this. The patient reports some left shoulder pain and some low back pain that is chronic in nature. He also has a history of nocturnal leg cramps that is mainly affecting the feet. Otherwise, he seems to be doing fairly well. He is considering coming off the Aricept to see if this is the cause of his dizziness and gait instability. He is pleased with response that he has had on the medication.  Past Medical History:  Diagnosis Date  . Allergy   . BPH (benign prostatic hyperplasia)   . Cataract   . Chronic cough   . Colon polyps   . Deviated nasal septum   . Family history of prostate cancer   . Hypertension   . Memory difficulty 09/22/2016  . Prostate CA ([place])   . Prostate cancer [name])     Past Surgical History:  Procedure Laterality Date  . COLONOSCOPY  2011  . EYE SURGERY Right    cataracts  . EYE SURGERY Left    cataracts  . HAND SURGERY Left 1992  . JOINT REPLACEMENT Bilateral 2010   bilat knees  . KNEE ARTHROSCOPY Bilateral 1960's and 70's  . PROSTATE BIOPSY    . ROTATOR CUFF REPAIR Right 2004  . TOE SURGERY Left 1970   little toe  . VASECTOMY      Family History  Problem Relation Age of Onset  . Healthy Mother   . Prostate cancer Father        Dx 22s; Deceased 75  . Prostate cancer Brother 16       currently 60  . Alcohol abuse Brother   . Bipolar disorder Brother   . Prostate cancer Paternal Uncle        unsure of  age; possibly 2nd uncle also has prostate ca  . Prostate cancer Maternal Uncle        unsure of age  . Breast cancer Maternal Aunt        unsure of age  . Colon cancer Neg Hx   . Esophageal cancer Neg Hx   . Rectal cancer Neg Hx   . Stomach cancer Neg Hx     Social history:  reports that he has never smoked. He has never used smokeless tobacco. He reports that he drinks alcohol. He reports that he does not use drugs.   No Known Allergies  Medications:  Prior to Admission medications   Medication Sig Start Date End Date Taking? Authorizing Provider  amLODipine (NORVASC) 10 MG tablet TAKE 1 BY MOUTH DAILY 06/29/16  Yes Chipper Herb, MD  Cholecalciferol (VITAMIN D PO) Take 1 capsule by mouth daily.    Yes [provider]  donepezil (ARICEPT) 10 MG tablet TAKE 1 TABLET(10 MG) BY MOUTH AT BEDTIME 11/30/16  Yes Chipper Herb, MD  doxazosin (CARDURA) 2 MG tablet TAKE 1 TABLET BY MOUTH DAILY 12/18/16  Yes Chipper Herb, MD  FIBER FORMULA PO  Take 3 capsules by mouth 2 (two) times daily.   Yes [provider]  losartan (COZAAR) 100 MG tablet TAKE 1 TABLET BY MOUTH DAILY 03/20/17  Yes Chipper Herb, MD  meloxicam (MOBIC) 15 MG tablet Take 1 tablet by mouth daily. 02/06/17  Yes [provider]  potassium chloride SA (K-DUR,KLOR-CON) 20 MEQ tablet TAKE 1 TABLET(20 MEQ) BY MOUTH THREE TIMES DAILY 02/07/17  Yes Chipper Herb, MD  tamsulosin (FLOMAX) 0.4 MG CAPS capsule TAKE ONE CAPSULE BY MOUTH TWICE DAILY 11/28/16  Yes Chipper Herb, MD  tolterodine (DETROL LA) 4 MG 24 hr capsule TK ONE C PO QHS 10/24/16  Yes [provider]  esomeprazole (NEXIUM) 20 MG capsule Take 20 mg by mouth daily at 12 noon.    [provider]  fluticasone (FLONASE) 50 MCG/ACT nasal spray One to 2 sprays each nostril at bedtime and stay on this medication for that at least the next 4-6 weeks Patient not taking: Reported on 03/23/2017 09/29/15   Chipper Herb, MD  potassium  chloride SA (K-DUR,KLOR-CON) 20 MEQ tablet TAKE 1 TABLET(20 MEQ) BY MOUTH THREE TIMES DAILY Patient not taking: Reported on 03/23/2017 03/07/17   Chipper Herb, MD    ROS:  Out of a complete 14 system review of symptoms, the patient complains only of the following symptoms, and all other reviewed systems are negative.  Hearing loss Cold intolerance Moles  Blood pressure 132/78, pulse 64, height 5' 5.5" (1.664 m), weight 198 lb 8 oz (90 kg).  Physical Exam  General: The patient is alert and cooperative at the time of the examination.  Skin: No significant peripheral edema is noted.   Neurologic Exam  Mental status: The patient is alert and oriented x 3 at the time of the examination. The patient has apparent normal recent and remote memory, with an apparently normal attention span and concentration ability. Mini-Mental Status Examination done today shows total score 28/30.   Cranial nerves: Facial symmetry is present. Speech is normal, no aphasia or dysarthria is noted. Extraocular movements are full. Visual fields are full.  Motor: The patient has good strength in all 4 extremities.  Sensory examination: Soft touch sensation is symmetric on the face, arms, and legs.  Coordination: The patient has good finger-nose-finger and heel-to-shin bilaterally.  Gait and station: The patient has a normal gait. Tandem gait is normal. Romberg is negative. No drift is seen.  Reflexes: Deep tendon reflexes are symmetric.   MRI brain 10/06/16:  IMPRESSION:  Abnormal MRI brain (without) demonstrating: 1. 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  2. Mild perisylvian atrophy. 3. No acute findings.  * MRI scan images were reviewed online. I agree with the written report.    Assessment/Plan:  1. Mild memory disturbance  2. Nocturnal leg cramps  The patient seems to be doing well on Aricept, I have suggested that he continue the medication  but if he believes that he is having side effects he may cut back to 5 mg at night. The patient will follow-up in 6 months. If the nocturnal leg cramps become severe, he will call for a prescription.  Jill Alexanders MD 03/23/2017 9:11 AM  [facility] [address] [place], [place] [address]  Phone [phone] Fax [phone]

## 2017-03-30 ENCOUNTER — Encounter: Payer: Self-pay | Admitting: Neurology

## 2017-03-30 ENCOUNTER — Ambulatory Visit: Payer: Medicare Other | Attending: Family Medicine | Admitting: Neurology

## 2017-03-30 DIAGNOSIS — G4733 Obstructive sleep apnea (adult) (pediatric): Secondary | ICD-10-CM | POA: Insufficient documentation

## 2017-03-30 DIAGNOSIS — G4730 Sleep apnea, unspecified: Secondary | ICD-10-CM | POA: Diagnosis present

## 2017-04-02 ENCOUNTER — Other Ambulatory Visit: Payer: Self-pay | Admitting: Family Medicine

## 2017-04-07 NOTE — Procedures (Signed)
[place] A. Merlene Laughter, MD     [email or web address]             NOCTURNAL POLYSOMNOGRAPHY   LOCATION: [facility]   Patient Name: Ricky [name], Ricky [name] Date: 03/30/2017 Gender: Male D.O.B: January 19, 1941 Age (years): 68 Referring Provider: Redge Gainer Height (inches): 72 Interpreting Physician: Phillips Odor MD, ABSM Weight (lbs): 203 RPSGT: Peak, Robert BMI: 32 MRN: [id number] Neck Size: 17.00 CLINICAL INFORMATION The patient is referred for a split night study with BPAP. Most recent polysomnogram dated 02/12/2017 revealed an AHI of 19.2/h and RDI of 19.6/h.  MEDICATIONS Medications self-administered by patient taken the night of the study : N/A  Current Outpatient Prescriptions:  .  amLODipine (NORVASC) 10 MG tablet, TAKE 1 BY MOUTH DAILY, Disp: 90 tablet, Rfl: 3 .  Cholecalciferol (VITAMIN D PO), Take 1 capsule by mouth daily. , Disp: , Rfl:  .  donepezil (ARICEPT) 10 MG tablet, TAKE 1 TABLET(10 MG) BY MOUTH AT BEDTIME, Disp: 90 tablet, Rfl: 2 .  doxazosin (CARDURA) 2 MG tablet, TAKE 1 TABLET BY MOUTH DAILY, Disp: 90 tablet, Rfl: 0 .  esomeprazole (NEXIUM) 20 MG capsule, Take 20 mg by mouth daily at 12 noon., Disp: , Rfl:  .  FIBER FORMULA PO, Take 3 capsules by mouth 2 (two) times daily., Disp: , Rfl:  .  fluticasone (FLONASE) 50 MCG/ACT nasal spray, One to 2 sprays each nostril at bedtime and stay on this medication for that at least the next 4-6 weeks (Patient not taking: Reported on 03/23/2017), Disp: 16 g, Rfl: 6 .  losartan (COZAAR) 100 MG tablet, TAKE 1 TABLET BY MOUTH DAILY, Disp: 90 tablet, Rfl: 1 .  meloxicam (MOBIC) 15 MG tablet, Take 1 tablet by mouth daily., Disp: , Rfl: 0 .  potassium chloride SA (K-DUR,KLOR-CON) 20 MEQ tablet, TAKE 1 TABLET(20 MEQ) BY MOUTH THREE TIMES DAILY, Disp: 270 tablet, Rfl: 0 .  potassium chloride SA (K-DUR,KLOR-CON) 20 MEQ tablet, TAKE 1 TABLET(20 MEQ) BY MOUTH THREE TIMES DAILY (Patient not taking: Reported on 03/23/2017),  Disp: 90 tablet, Rfl: 1 .  tamsulosin (FLOMAX) 0.4 MG CAPS capsule, TAKE ONE CAPSULE BY MOUTH TWICE DAILY, Disp: 180 capsule, Rfl: 0 .  tamsulosin (FLOMAX) 0.4 MG CAPS capsule, TAKE ONE CAPSULE BY MOUTH TWICE DAILY, Disp: 180 capsule, Rfl: 0 .  tolterodine (DETROL LA) 4 MG 24 hr capsule, TK ONE C PO QHS, Disp: , Rfl: 10   SLEEP STUDY TECHNIQUE As per the AASM Manual for the Scoring of Sleep and Associated Events v2.3 (April 2016) with a hypopnea requiring 4% desaturations.  The channels recorded and monitored were frontal, central and occipital EEG, electrooculogram (EOG), submentalis EMG (chin), nasal and oral airflow, thoracic and abdominal wall motion, anterior tibialis EMG, snore microphone, electrocardiogram, and pulse oximetry. Bi-level positive airway pressure (BiPAP) was initiated when the patient met split night criteria and was titrated according to treat sleep-disordered breathing.  RESPIRATORY PARAMETERS Diagnostic  Total AHI (/hr): 19.5 RDI (/hr): 24.1 OA Index (/hr): 2.8 CA Index (/hr): 1.7 REM AHI (/hr): 46.3 NREM AHI (/hr): 12.7 Supine AHI (/hr): 44.0 Non-supine AHI (/hr): 16.48 Min O2 Sat (%): 85.00 Mean O2 (%): 94.05 Time below 88% (min): 2.2     Titration: The patient was titrated on conventional CPAP between 5 and 12. Unfortunate, he had difficulties breathing against regular CPAP and central apneas were noted. He was placed on bilevel pressures and did well with this.  Optimal IPAP Pressure (cm): 12 Optimal EPAP Pressure (cm): 8 AHI  at Optimal Pressure (/hr): 9.1 Min O2 at Optimal Pressure (%): 88.0 Sleep % at Optimal (%): 95 Supine % at Optimal (%): 41         SLEEP ARCHITECTURE The study was initiated at 9:39:05 PM and terminated at 4:31:49 AM. The total recorded time was 412.7 minutes. EEG confirmed total sleep time was 334.5 minutes yielding a sleep efficiency of 81.0%. Sleep onset after lights out was 4.3 minutes with a REM latency of 88.5 minutes. The patient spent  8.82% of the night in stage N1 sleep, 66.82% in stage N2 sleep, 4.19% in stage N3 and 20.18% in REM. Wake after sleep onset (WASO) was 74.0 minutes. The Arousal Index was 15.1/hour.  LEG MOVEMENT DATA The total Periodic Limb Movements of Sleep (PLMS) were 0. The PLMS index was 0.00 .  CARDIAC DATA The 2 lead EKG demonstrated sinus rhythm. The mean heart rate was 62.24 beats per minute. Other EKG findings include: None.   IMPRESSIONS - Moderate obstructive sleep apnea occurred during the diagnostic portion of the study (AHI = 19.5 /hour). The patient could not tolerate conventional CPAP but did well on bilevel pressures of 12/8.  Delano Metz, MD Diplomate, American Board of Sleep Medicine.  ELECTRONICALLY SIGNED ON:  04/07/2017, 10:21 PM [place] PH: [phone]   FX: [phone] [place]

## 2017-04-07 NOTE — Procedures (Signed)
[place] A. Merlene Laughter, MD     [email or web address]             NOCTURNAL POLYSOMNOGRAPHY   LOCATION: [facility]  Patient Name: Ricky [name] Date: 03/29/2017 Gender: Male D.O.B: 01/19/1928 Age (years): 89 Referring Provider: Javier Glazier Height (inches): 70 Interpreting Physician: Phillips Odor MD, ABSM Weight (lbs): 160 RPSGT: Peak, Robert BMI: 23 MRN: [id number] Neck Size: 16.00 CLINICAL INFORMATION Sleep Study Type: NPSG  Indication for sleep study: Fatigue  Epworth Sleepiness Score: 2  SLEEP STUDY TECHNIQUE As per the AASM Manual for the Scoring of Sleep and Associated Events v2.3 (April 2016) with a hypopnea requiring 4% desaturations.  The channels recorded and monitored were frontal, central and occipital EEG, electrooculogram (EOG), submentalis EMG (chin), nasal and oral airflow, thoracic and abdominal wall motion, anterior tibialis EMG, snore microphone, electrocardiogram, and pulse oximetry.  MEDICATIONS Medications self-administered by patient taken the night of the study : N/A  Current Outpatient Prescriptions:  .  amLODipine (NORVASC) 10 MG tablet, TAKE 1 BY MOUTH DAILY, Disp: 90 tablet, Rfl: 3 .  Cholecalciferol (VITAMIN D PO), Take 1 capsule by mouth daily. , Disp: , Rfl:  .  donepezil (ARICEPT) 10 MG tablet, TAKE 1 TABLET(10 MG) BY MOUTH AT BEDTIME, Disp: 90 tablet, Rfl: 2 .  doxazosin (CARDURA) 2 MG tablet, TAKE 1 TABLET BY MOUTH DAILY, Disp: 90 tablet, Rfl: 0 .  esomeprazole (NEXIUM) 20 MG capsule, Take 20 mg by mouth daily at 12 noon., Disp: , Rfl:  .  FIBER FORMULA PO, Take 3 capsules by mouth 2 (two) times daily., Disp: , Rfl:  .  fluticasone (FLONASE) 50 MCG/ACT nasal spray, One to 2 sprays each nostril at bedtime and stay on this medication for that at least the next 4-6 weeks (Patient not taking: Reported on 03/23/2017), Disp: 16 g, Rfl: 6 .  losartan (COZAAR) 100 MG tablet, TAKE 1 TABLET BY MOUTH DAILY, Disp: 90 tablet,  Rfl: 1 .  meloxicam (MOBIC) 15 MG tablet, Take 1 tablet by mouth daily., Disp: , Rfl: 0 .  potassium chloride SA (K-DUR,KLOR-CON) 20 MEQ tablet, TAKE 1 TABLET(20 MEQ) BY MOUTH THREE TIMES DAILY, Disp: 270 tablet, Rfl: 0 .  potassium chloride SA (K-DUR,KLOR-CON) 20 MEQ tablet, TAKE 1 TABLET(20 MEQ) BY MOUTH THREE TIMES DAILY (Patient not taking: Reported on 03/23/2017), Disp: 90 tablet, Rfl: 1 .  tamsulosin (FLOMAX) 0.4 MG CAPS capsule, TAKE ONE CAPSULE BY MOUTH TWICE DAILY, Disp: 180 capsule, Rfl: 0 .  tamsulosin (FLOMAX) 0.4 MG CAPS capsule, TAKE ONE CAPSULE BY MOUTH TWICE DAILY, Disp: 180 capsule, Rfl: 0 .  tolterodine (DETROL LA) 4 MG 24 hr capsule, TK ONE C PO QHS, Disp: , Rfl: 10   SLEEP ARCHITECTURE The study was initiated at 10:02:05 PM and ended at 5:10:04 AM.  Sleep onset time was 74.4 minutes and the sleep efficiency was 58.7%. The total sleep time was 251.0 minutes.  Stage REM latency was 77.0 minutes.  The patient spent 18.72% of the night in stage N1 sleep, 62.56% in stage N2 sleep, 0.80% in stage N3 and 17.92% in REM.  Alpha intrusion was absent.  Supine sleep was 28.77%.  RESPIRATORY PARAMETERS The overall apnea/hypopnea index (AHI) was 3.6 per hour. There were 13 total apneas, including 13 obstructive, 0 central and 0 mixed apneas. There were 2 hypopneas and 44 RERAs.  The AHI during Stage REM sleep was 0.0 per hour.  AHI while supine was 12.5 per hour.  The mean oxygen saturation  was 93.80%. The minimum SpO2 during sleep was 88.00%.  Moderate snoring was noted during this study.  CARDIAC DATA The 2 lead EKG demonstrated sinus rhythm. The mean heart rate was 54.80 beats per minute. Other EKG findings include: None. LEG MOVEMENT DATA The total PLMS were 207 with a resulting PLMS index of 49.47. Associated arousal with leg movement index was 4.8.  IMPRESSIONS - Severe periodic limb movements of sleep occurred during the study. - Abnormal sleep architecture is  noted with a reduced sleep efficiency and are reduced slow wave sleep.   Delano Metz, MD Diplomate, American Board of Sleep Medicine.  ELECTRONICALLY SIGNED ON:  04/07/2017, 10:10 PM [place] PH: [phone]   FX: [phone] [place]

## 2017-04-08 ENCOUNTER — Encounter: Payer: Self-pay | Admitting: Family Medicine

## 2017-04-08 DIAGNOSIS — G4730 Sleep apnea, unspecified: Secondary | ICD-10-CM | POA: Insufficient documentation

## 2017-04-12 DIAGNOSIS — R232 Flushing: Secondary | ICD-10-CM | POA: Diagnosis not present

## 2017-04-12 DIAGNOSIS — C61 Malignant neoplasm of prostate: Secondary | ICD-10-CM | POA: Diagnosis not present

## 2017-05-07 ENCOUNTER — Telehealth: Payer: Self-pay | Admitting: Radiation Oncology

## 2017-05-07 ENCOUNTER — Encounter: Payer: Self-pay | Admitting: Radiation Oncology

## 2017-05-07 NOTE — Progress Notes (Signed)
Per Alison's request I phone [facility] and spoke with Ander Purpura, RN for Dr. Karsten Ro. Lauren confirms the patient received Lupron 45 (good for 6 months) on 11/22/16. Also, Lauren reports the patient is scheduled for a repeat scan to determine prostate volume on 06/07/17.  Informed Shona Simpson, PA-C of these findings.

## 2017-05-07 NOTE — Telephone Encounter (Signed)
I called the patient back to review that he had lupron 45 mg on 11/22/16. He's due for repeat ultrasound of the prostate on 06/07/17. He still wants to keep his appt on 8/6 to meet with Dr. Tammi Klippel and to outline plans for treatment. We spent about 15 minutes discussing long term side effects of either radiotherapy option we would have to offer.

## 2017-05-07 NOTE — Telephone Encounter (Signed)
I spoke with Ricky [name] this morning. He has questions about how to proceed and would like to meet back with Dr. Tammi Klippel prior to committing to seed implant. He's been seen [facility] to discuss Proton [facility]. He's had a gland size of 78 cc by ultrasound in in the 80 range by CT previously. He was supposed to have an ADT injection in February followed by repeat ultrasound to assess gland size. He will come on 8/6 for an 8:30 am appt to meet with Dr. Tammi Klippel.

## 2017-05-08 ENCOUNTER — Telehealth: Payer: Self-pay | Admitting: Family Medicine

## 2017-05-08 NOTE — Telephone Encounter (Signed)
Last office note, labs and PSA faxed Dr.Sinesi Attn: Vaughan Basta [phone]

## 2017-05-11 ENCOUNTER — Encounter: Payer: Self-pay | Admitting: Radiation Oncology

## 2017-05-13 NOTE — Progress Notes (Signed)
Radiation Oncology         [phone] ________________________________  Name: Lando Alcalde MRN: [id number]  Date: 05/14/2017  DOB: 1940-11-16  Follow-Up Visit Note  CC: Chipper Herb, MD  Kathie Rhodes, MD  Diagnosis:   76 y.o. gentleman with stage T1c adenocarcinoma of the prostate with a Gleason's score of 3+4 and a PSA of 9.02    ICD-10-CM   1. Malignant neoplasm prostate ([place]) C61       Interval Since Last Radiation:  N/A - Not treated yet  Narrative:  The patient returns today for routine follow-up.  He was noted to have an elevated PSA of 9.02 on 09/13/16. He has been followed by Dr. Karsten Ro for many years and has previously had some type of microwave procedure of the prostate years ago, for what sounds like BPH. He was seen more recently due to his elevated PSA on 09/20/16 for discussion of working this up. A  digital rectal examination was performed on 10/10/16 revealing no nodularity.  The patient proceeded to transrectal ultrasound with 12 biopsies of the prostate on 10/10/16.  The prostate volume measured 78 cc.  Out of 12 core biopsies,10 were positive.  The maximum Gleason score was 3+4, and this was seen in left mid lateral, left apex lateral, left base, left apex, right base, right mid, and right mid lateral. A CT scan of the pelvis was performed on 10/17/16. This revealed no adenopathy or osseous metastasis to the pelvis.  I saw him in consultation and we decided to downsize the prostate for possible seed implant.  He had Lupron 45 mg on 11/22/16. He's due for repeat ultrasound of the prostate on 06/07/17.  He presents today because he would like to meet back prior to committing to seed implant. He's been seen at [facility] to discuss Proton therapy.   ALLERGIES:  has No Known Allergies.  Meds: Current Outpatient Prescriptions  Medication Sig Dispense Refill  . amLODipine (NORVASC) 10 MG tablet TAKE 1 BY MOUTH DAILY 90 tablet 3  . Cholecalciferol (VITAMIN D PO) Take 1  capsule by mouth daily.     Marland Kitchen donepezil (ARICEPT) 10 MG tablet TAKE 1 TABLET(10 MG) BY MOUTH AT BEDTIME 90 tablet 2  . doxazosin (CARDURA) 2 MG tablet TAKE 1 TABLET BY MOUTH DAILY 90 tablet 0  . esomeprazole (NEXIUM) 20 MG capsule Take 20 mg by mouth daily at 12 noon.    Marland Kitchen FIBER FORMULA PO Take 3 capsules by mouth 2 (two) times daily.    Marland Kitchen losartan (COZAAR) 100 MG tablet TAKE 1 TABLET BY MOUTH DAILY 90 tablet 1  . meloxicam (MOBIC) 15 MG tablet Take 1 tablet by mouth daily.  0  . potassium chloride SA (K-DUR,KLOR-CON) 20 MEQ tablet TAKE 1 TABLET(20 MEQ) BY MOUTH THREE TIMES DAILY 270 tablet 0  . potassium chloride SA (K-DUR,KLOR-CON) 20 MEQ tablet TAKE 1 TABLET(20 MEQ) BY MOUTH THREE TIMES DAILY 90 tablet 1  . tamsulosin (FLOMAX) 0.4 MG CAPS capsule TAKE ONE CAPSULE BY MOUTH TWICE DAILY 180 capsule 0  . tamsulosin (FLOMAX) 0.4 MG CAPS capsule TAKE ONE CAPSULE BY MOUTH TWICE DAILY 180 capsule 0  . tolterodine (DETROL LA) 4 MG 24 hr capsule TK ONE C PO QHS  10  . fluticasone (FLONASE) 50 MCG/ACT nasal spray One to 2 sprays each nostril at bedtime and stay on this medication for that at least the next 4-6 weeks (Patient not taking: Reported on 03/23/2017) 16 g 6   No  current facility-administered medications for this encounter.     Physical Findings: The patient is in no acute distress. Patient is alert and oriented.  weight is 201 lb 6.4 oz (91.4 kg). His blood pressure is 119/73 and his pulse is 69. His respiration is 16 and oxygen saturation is 100%. .  No significant changes.  Lab Findings: Lab Results  Component Value Date   WBC 4.5 08/10/2016   WBC 4.7 07/02/2014   WBC 5.0 01/29/2009   HGB 14.8 08/10/2016   HCT 43.4 08/10/2016   PLT 184 08/10/2016    Lab Results  Component Value Date   NA 145 (H) 08/10/2016   K 3.8 08/10/2016   CO2 23 08/10/2016   GLUCOSE 80 08/10/2016   GLUCOSE 113 (H) 01/28/2009   BUN 17 08/10/2016   CREATININE 1.21 08/10/2016   BILITOT 0.5 08/10/2016    ALKPHOS 91 08/10/2016   AST 18 08/10/2016   ALT 18 08/10/2016   PROT 6.3 08/10/2016   ALBUMIN 4.2 08/10/2016   CALCIUM 9.5 08/10/2016    Radiographic Findings: No results found.  Impression:  The patient has intermediate risk prostate cancer. He has previously discussed a variety of treatment options and expressed interest in prostate brachytherapy. However, due to a large gland size, he is receiving androgen deprivation for the purpose of downsizing the prostate gland. He will present for follow-up ultrasound to assess prostate volume on August 30. He presents today with several questions  Plan:  The patient asked several questions today comparing brachytherapy against external beam radiation treatment and more specifically brachytherapy against proton therapy. We reviewed the pros and cons of each of the discussed treatments as well as anticipated acute and late sequela. The patient would like to proceed with ultrasound on August 30 and we'll likely proceed with prostate brachytherapy thereafter.  _____________________________________  Sheral Apley. Tammi Klippel, M.D.

## 2017-05-14 ENCOUNTER — Ambulatory Visit
Admission: RE | Admit: 2017-05-14 | Discharge: 2017-05-14 | Disposition: A | Discharge status: Home / Self Care | Payer: Medicare Other | Source: Ambulatory Visit | Attending: Radiation Oncology | Admitting: Radiation Oncology

## 2017-05-14 ENCOUNTER — Encounter: Payer: Self-pay | Admitting: Radiation Oncology

## 2017-05-14 VITALS — BP 119/73 | HR 69 | Resp 16 | Wt 201.4 lb

## 2017-05-14 DIAGNOSIS — Z9289 Personal history of other medical treatment: Secondary | ICD-10-CM | POA: Diagnosis not present

## 2017-05-14 DIAGNOSIS — C61 Malignant neoplasm of prostate: Secondary | ICD-10-CM | POA: Diagnosis not present

## 2017-05-14 DIAGNOSIS — Z79899 Other long term (current) drug therapy: Secondary | ICD-10-CM | POA: Diagnosis not present

## 2017-05-14 DIAGNOSIS — R972 Elevated prostate specific antigen [PSA]: Secondary | ICD-10-CM | POA: Diagnosis not present

## 2017-05-14 NOTE — Progress Notes (Signed)
Weight and vitals stable. Denies pain. Patient scheduled for ultrasound on August 30 to determine if prostate size is smaller than 78 cc since Lupron 45 injection in February. Patient reports that he "put off" and has not rescheduled his consultation with [facility] to discuss proton therapy. LUTS are unchanged. Nocturia x 4. Reports occasional hematuria. Reports leakage. Reports mild intermittent dysuria  BP 119/73 (BP Location: Left Arm, Patient Position: Sitting, Cuff Size: Normal)   Pulse 69   Resp 16   Wt 201 lb 6.4 oz (91.4 kg)   SpO2 100%   BMI 31.54 kg/m  Wt Readings from Last 3 Encounters:  05/14/17 201 lb 6.4 oz (91.4 kg)  03/31/17 203 lb (92.1 kg)  03/23/17 198 lb 8 oz (90 kg)

## 2017-05-17 ENCOUNTER — Other Ambulatory Visit: Payer: Self-pay | Admitting: Family Medicine

## 2017-05-21 ENCOUNTER — Other Ambulatory Visit: Payer: Self-pay | Admitting: Family Medicine

## 2017-05-31 DIAGNOSIS — C61 Malignant neoplasm of prostate: Secondary | ICD-10-CM | POA: Diagnosis not present

## 2017-06-01 ENCOUNTER — Encounter (HOSPITAL_COMMUNITY): Payer: Self-pay

## 2017-06-07 ENCOUNTER — Telehealth: Payer: Self-pay | Admitting: *Deleted

## 2017-06-07 DIAGNOSIS — R232 Flushing: Secondary | ICD-10-CM | POA: Diagnosis not present

## 2017-06-07 DIAGNOSIS — C61 Malignant neoplasm of prostate: Secondary | ICD-10-CM | POA: Diagnosis not present

## 2017-06-07 NOTE — Telephone Encounter (Signed)
CALLED PATIENT TO REMIND OF APPT. FOR 06-08-17, SPOKE WITH PATIENT AND HE IS AWARE OF THIS APPT.

## 2017-06-08 ENCOUNTER — Inpatient Hospital Stay
Admission: RE | Admit: 2017-06-08 | Discharge: 2017-06-08 | Disposition: A | Discharge status: Home / Self Care | Payer: Medicare Other | Source: Ambulatory Visit | Attending: Radiation Oncology | Admitting: Radiation Oncology

## 2017-06-08 ENCOUNTER — Ambulatory Visit
Admission: RE | Admit: 2017-06-08 | Discharge: 2017-06-08 | Disposition: A | Discharge status: Home / Self Care | Payer: Medicare Other | Source: Ambulatory Visit | Attending: Radiation Oncology | Admitting: Radiation Oncology

## 2017-06-08 ENCOUNTER — Ambulatory Visit: Admission: RE | Admit: 2017-06-08 | Payer: Medicare Other | Source: Ambulatory Visit | Admitting: Radiation Oncology

## 2017-06-08 DIAGNOSIS — C61 Malignant neoplasm of prostate: Secondary | ICD-10-CM | POA: Diagnosis not present

## 2017-06-08 NOTE — Progress Notes (Signed)
  Radiation Oncology         (336) [phone] ________________________________  Name: Ricky [name] MRN: [id number]  Date: 06/08/2017  DOB: 1941/01/10  SIMULATION AND TREATMENT PLANNING NOTE PUBIC ARCH STUDY  VO:HKGOV, Estella Husk, MD  Kathie Rhodes, MD  DIAGNOSIS: 76 y.o. gentleman with stage T1c adenocarcinoma of the prostate with a Gleason's score of 3+4 and a PSA of 9.02     ICD-10-CM   1. Malignant neoplasm prostate ([place]) C61     COMPLEX SIMULATION:  The patient presented today for evaluation for possible prostate seed implant. He was brought to the radiation planning suite and placed supine on the CT couch. A 3-dimensional image study set was obtained in upload to the planning computer. There, on each axial slice, I contoured the prostate gland. Then, using three-dimensional radiation planning tools I reconstructed the prostate in view of the structures from the transperineal needle pathway to assess for possible pubic arch interference. In doing so, I did not appreciate any pubic arch interference. Also, the patient's prostate volume was estimated based on the drawn structure. The volume was 46 cc.  Given the pubic arch appearance and prostate volume, patient remains a good candidate to proceed with prostate seed implant. Today, he freely provided informed written consent to proceed.    PLAN: The patient will undergo prostate seed implant.   ________________________________  Sheral Apley. Tammi Klippel, M.D.

## 2017-06-15 ENCOUNTER — Other Ambulatory Visit: Payer: Self-pay | Admitting: Urology

## 2017-06-15 ENCOUNTER — Telehealth: Payer: Self-pay | Admitting: *Deleted

## 2017-06-15 NOTE — Telephone Encounter (Signed)
CALLED PATIENT TO INFORM OF IMPLANT DATE, SPOKE WITH PATIENT AND HE IS AWARE OF THIS DATE 

## 2017-06-26 ENCOUNTER — Other Ambulatory Visit: Payer: Self-pay | Admitting: Family Medicine

## 2017-07-01 ENCOUNTER — Other Ambulatory Visit: Payer: Self-pay | Admitting: Family Medicine

## 2017-07-02 ENCOUNTER — Telehealth: Payer: Self-pay | Admitting: Radiation Oncology

## 2017-07-02 NOTE — Telephone Encounter (Signed)
Ms. Thelen wanted to confirm husband's chest x-ray for his implant. Explained that one is needed before implant (scheduled on 11/2) and that it is by walk-in.

## 2017-07-03 ENCOUNTER — Other Ambulatory Visit: Payer: Self-pay | Admitting: Urology

## 2017-07-04 ENCOUNTER — Other Ambulatory Visit: Payer: Self-pay | Admitting: Family Medicine

## 2017-07-05 ENCOUNTER — Other Ambulatory Visit: Payer: Self-pay | Admitting: Urology

## 2017-07-06 ENCOUNTER — Other Ambulatory Visit: Payer: Self-pay | Admitting: Urology

## 2017-07-09 ENCOUNTER — Other Ambulatory Visit: Payer: Self-pay

## 2017-07-09 ENCOUNTER — Encounter (HOSPITAL_COMMUNITY)
Admission: RE | Admit: 2017-07-09 | Discharge: 2017-07-09 | Disposition: A | Discharge status: Home / Self Care | Payer: Medicare Other | Source: Ambulatory Visit | Attending: Urology | Admitting: Urology

## 2017-07-09 DIAGNOSIS — C61 Malignant neoplasm of prostate: Secondary | ICD-10-CM | POA: Diagnosis not present

## 2017-07-09 DIAGNOSIS — R918 Other nonspecific abnormal finding of lung field: Secondary | ICD-10-CM | POA: Diagnosis not present

## 2017-07-12 ENCOUNTER — Ambulatory Visit (INDEPENDENT_AMBULATORY_CARE_PROVIDER_SITE_OTHER): Payer: Medicare Other | Admitting: Family Medicine

## 2017-07-12 ENCOUNTER — Encounter: Payer: Self-pay | Admitting: Family Medicine

## 2017-07-12 VITALS — BP 116/74 | HR 69 | Temp 97.2°F | Ht 67.0 in | Wt 203.0 lb

## 2017-07-12 DIAGNOSIS — R413 Other amnesia: Secondary | ICD-10-CM

## 2017-07-12 DIAGNOSIS — C61 Malignant neoplasm of prostate: Secondary | ICD-10-CM

## 2017-07-12 DIAGNOSIS — E559 Vitamin D deficiency, unspecified: Secondary | ICD-10-CM | POA: Diagnosis not present

## 2017-07-12 DIAGNOSIS — N400 Enlarged prostate without lower urinary tract symptoms: Secondary | ICD-10-CM

## 2017-07-12 DIAGNOSIS — E785 Hyperlipidemia, unspecified: Secondary | ICD-10-CM | POA: Diagnosis not present

## 2017-07-12 DIAGNOSIS — I10 Essential (primary) hypertension: Secondary | ICD-10-CM | POA: Diagnosis not present

## 2017-07-12 DIAGNOSIS — Z23 Encounter for immunization: Secondary | ICD-10-CM | POA: Diagnosis not present

## 2017-07-12 NOTE — Addendum Note (Signed)
Addended by: Zannie Cove on: 07/12/2017 12:38 PM   Modules accepted: Orders

## 2017-07-12 NOTE — Addendum Note (Signed)
Addended by: Zannie Cove on: 07/12/2017 03:19 PM   Modules accepted: Orders

## 2017-07-12 NOTE — Patient Instructions (Addendum)
Medicare Annual Wellness Visit  [place] and the medical providers at [facility] strive to bring you the best medical care.  In doing so we not only want to address your current medical conditions and concerns but also to detect new conditions early and prevent illness, disease and health-related problems.    Medicare offers a yearly Wellness Visit which allows our clinical staff to assess your need for preventative services including immunizations, lifestyle education, counseling to decrease risk of preventable diseases and screening for fall risk and other medical concerns.    This visit is provided free of charge (no copay) for all Medicare recipients. The clinical pharmacists at [facility] have begun to conduct these Wellness Visits which will also include a thorough review of all your medications.    As you primary medical provider recommend that you make an appointment for your Annual Wellness Visit if you have not done so already this year.  You may set up this appointment before you leave today or you may call back [phone]) and schedule an appointment.  Please make sure when you call that you mention that you are scheduling your Annual Wellness Visit with the clinical pharmacist so that the appointment may be made for the proper length of time.     Continue current medications. Continue good therapeutic lifestyle changes which include good diet and exercise. Fall precautions discussed with patient. If an FOBT was given today- please return it to our front desk. If you are over 68 years old - you may need Prevnar 31 or the adult Pneumonia vaccine.  **Flu shots are available--- please call and schedule a FLU-CLINIC appointment**  After your visit with [place] today you will receive a survey in the mail or online from [employer] regarding your care with [place]. Please take a moment to fill this out. Your feedback is very  important to [place] as you can help [place] better understand your patient needs as well as improve your experience and satisfaction. WE CARE ABOUT YOU!!!   Follow-up regularly with urology and oncology Discuss with the urologist your symptoms of nocturia

## 2017-07-12 NOTE — Progress Notes (Signed)
Subjective:    Patient ID: Ricky [name], male    DOB: 09-08-1941, 76 y.o.   MRN: [id number]  HPI Pt here for follow up and management of chronic medical problems which includes hyperlipidemia and hypertension. He is taking medication regularly.The patient is being followed regularly because of his prostate cancer and is planning to have seed implants soon. He is also taking Aricept for his memory issues. He is on Flomax for his enlarged prostate symptoms. He is taking his blood pressure medicines regularly. His vital signs are stable and his blood pressure is good. He has no specific complaints today. The patient does have a history of sleep apnea and has been seeing the neurologist for this. The patient did receive some hormone shots for his prostate cancer and this is struck the prostate considerably and he plans to start the seed implants soon. He does still have some symptoms with nocturia and he will plan to talk to the urologist about this. He is taking his Aricept. He seems calmer today. His vital signs are stable. He denies any chest pain or shortness of breath but does tire more easily than usual and has had some hot flashes from the hormone shots. He denies any shortness of breath. He denies any trouble with his stomach including nausea vomiting diarrhea or blood in the stool. He is passing his water regularly without symptoms other than frequent nocturia. Family history wise one of his brothers has had a knee replacement.     Patient Active Problem List   Diagnosis Date Noted  . Sleep apnea in adult 04/08/2017  . Nocturnal leg cramps 03/23/2017  . Genetic testing 12/29/2016  . Family history of breast cancer 12/13/2016  . Family history of prostate cancer   . Malignant neoplasm prostate ([place]) 11/13/2016  . Memory difficulty 09/22/2016  . Headache syndrome 09/22/2016  . Cough variant asthma vs UACS  11/12/2015  . Hyperlipidemia 10/15/2015  . Vitamin D deficiency 03/12/2014  . BPH  (benign prostatic hyperplasia) 10/01/2013  . Erectile dysfunction 10/01/2013  . PERSONAL HX COLONIC POLYPS 01/25/2010  . 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 Essential hypertension 01/13/2009   Outpatient Encounter Prescriptions as of 07/12/2017  Medication Sig  . amLODipine (NORVASC) 10 MG tablet TAKE 1 TABLET BY MOUTH DAILY  . Cholecalciferol (VITAMIN D PO) Take 1 capsule by mouth daily.   Marland Kitchen donepezil (ARICEPT) 10 MG tablet TAKE 1 TABLET(10 MG) BY MOUTH AT BEDTIME  . doxazosin (CARDURA) 2 MG tablet TAKE 1 TABLET BY MOUTH DAILY  . esomeprazole (NEXIUM) 20 MG capsule Take 20 mg by mouth daily at 12 noon.  Marland Kitchen FIBER FORMULA PO Take 3 capsules by mouth 2 (two) times daily.  . fluticasone (FLONASE) 50 MCG/ACT nasal spray One to 2 sprays each nostril at bedtime and stay on this medication for that at least the next 4-6 weeks  . losartan (COZAAR) 100 MG tablet TAKE 1 TABLET BY MOUTH DAILY  . meloxicam (MOBIC) 15 MG tablet Take 1 tablet by mouth daily.  . potassium chloride SA (K-DUR,KLOR-CON) 20 MEQ tablet TAKE 1 TABLET(20 MEQ) BY MOUTH THREE TIMES DAILY  . tamsulosin (FLOMAX) 0.4 MG CAPS capsule TAKE ONE CAPSULE BY MOUTH TWICE DAILY  . tolterodine (DETROL LA) 4 MG 24 hr capsule TK ONE C PO QHS  . [DISCONTINUED] potassium chloride SA (K-DUR,KLOR-CON) 20 MEQ tablet TAKE 1 TABLET BY MOUTH 3 TIMES DAILY  . [DISCONTINUED] potassium chloride SA (K-DUR,KLOR-CON) 20 MEQ tablet TAKE 1 TABLET(20 MEQ)  BY MOUTH THREE TIMES DAILY  . [DISCONTINUED] tamsulosin (FLOMAX) 0.4 MG CAPS capsule TAKE ONE CAPSULE BY MOUTH TWICE DAILY  . [DISCONTINUED] tamsulosin (FLOMAX) 0.4 MG CAPS capsule TAKE ONE CAPSULE BY MOUTH TWICE DAILY   No facility-administered encounter medications on file as of 07/12/2017.      Review of Systems  Constitutional: Negative.   HENT: Negative.   Eyes: Negative.   Respiratory: Negative.   Cardiovascular: Negative.   Gastrointestinal: Negative.   Endocrine: Negative.     Genitourinary: Negative.   Musculoskeletal: Negative.   Skin: Negative.   Allergic/Immunologic: Negative.   Neurological: Negative.   Hematological: Negative.   Psychiatric/Behavioral: Negative.        Objective:   Physical Exam  Constitutional: He is oriented to person, place, and time. He appears well-developed and well-nourished. No distress.  The patient is pleasant calmer and alert  HENT:  Head: Normocephalic and atraumatic.  Right Ear: External ear normal.  Left Ear: External ear normal.  Nose: Nose normal.  Mouth/Throat: Oropharynx is clear and moist. No oropharyngeal exudate.  Eyes: Pupils are equal, round, and reactive to light. Conjunctivae and EOM are normal. Right eye exhibits no discharge. Left eye exhibits no discharge. No scleral icterus.  Neck: Normal range of motion. Neck supple. No thyromegaly present.  No bruits or thyromegaly  Cardiovascular: Normal rate, regular rhythm, normal heart sounds and intact distal pulses.   No murmur heard. The heart is regular at 72/m  Pulmonary/Chest: Effort normal and breath sounds normal. No respiratory distress. He has no wheezes. He has no rales. He exhibits no tenderness.  No axillary adenopathy Clear anteriorly and posteriorly  Abdominal: Soft. Bowel sounds are normal. He exhibits no mass. There is no tenderness. There is no rebound and no guarding.  No abdominal tenderness masses or bruits or inguinal adenopathy  Genitourinary:  Genitourinary Comments: The patient sees the urologist regularly because of his prostate cancer.  Musculoskeletal: Normal range of motion. He exhibits no edema or tenderness.  Lymphadenopathy:    He has no cervical adenopathy.  Neurological: He is alert and oriented to person, place, and time. He has normal reflexes. No cranial nerve deficit.  Skin: Skin is warm and dry. No rash noted.  Psychiatric: He has a normal mood and affect. His behavior is normal. Judgment and thought content normal.   Nursing note and vitals reviewed.  BP 116/74 (BP Location: Left Arm)   Pulse 69   Temp (!) 97.2 F (36.2 C) (Oral)   Ht 5\' 7"  (1.702 m)   Wt 203 lb (92.1 kg)   BMI 31.79 kg/m         Assessment & Plan:  1. Hyperlipidemia with target LDL less than 100 -Continue current treatment pending results of lab work. Continue with aggressive therapeutic lifestyle changes.  2. Essential hypertension -The blood pressure is good today and he will continue with current treatment  3. Vitamin D deficiency -Continue with vitamin D replacement pending results of lab work  4. Memory impairment -Continue with Aricept and follow-up with neurologist as planned  5. Prostate cancer [facility]) -Follow-up with urologist and oncologist as planned and especially for seed implants and discussed with urologist your nocturia.  6. Benign prostatic hyperplasia, unspecified whether lower urinary tract symptoms present -Follow-up with urology  No orders of the defined types were placed in this encounter.  Patient Instructions  Medicare Annual Wellness Visit  [facility] and the medical providers at [facility] strive to bring you the best medical care.  In doing so we not only want to address your current medical conditions and concerns but also to detect new conditions early and prevent illness, disease and health-related problems.    Medicare offers a yearly Wellness Visit which allows our clinical staff to assess your need for preventative services including immunizations, lifestyle education, counseling to decrease risk of preventable diseases and screening for fall risk and other medical concerns.    This visit is provided free of charge (no copay) for all Medicare recipients. The clinical pharmacists at [facility] have begun to conduct these Wellness Visits which will also include a thorough review of all your medications.    As you  primary medical provider recommend that you make an appointment for your Annual Wellness Visit if you have not done so already this year.  You may set up this appointment before you leave today or you may call back ([phone]) and schedule an appointment.  Please make sure when you call that you mention that you are scheduling your Annual Wellness Visit with the clinical pharmacist so that the appointment may be made for the proper length of time.     Continue current medications. Continue good therapeutic lifestyle changes which include good diet and exercise. Fall precautions discussed with patient. If an FOBT was given today- please return it to our front desk. If you are over 23 years old - you may need Prevnar 65 or the adult Pneumonia vaccine.  **Flu shots are available--- please call and schedule a FLU-CLINIC appointment**  After your visit with [place] today you will receive a survey in the mail or online from [employer] regarding your care with [place]. Please take a moment to fill this out. Your feedback is very important to [place] as you can help [place] better understand your patient needs as well as improve your experience and satisfaction. WE CARE ABOUT YOU!!!   Follow-up regularly with urology and oncology Discuss with the urologist your symptoms of nocturia    Arrie Senate MD   .

## 2017-07-13 LAB — BMP8+EGFR
BUN/Creatinine Ratio: 22 (ref 10–24)
BUN: 26 mg/dL (ref 8–27)
CALCIUM: 10 mg/dL (ref 8.6–10.2)
CHLORIDE: 106 mmol/L (ref 96–106)
CO2: 27 mmol/L (ref 20–29)
Creatinine, Ser: 1.16 mg/dL (ref 0.76–1.27)
GFR calc Af Amer: 70 mL/min/{1.73_m2} (ref >59)
GFR calc non Af Amer: 61 mL/min/{1.73_m2} (ref >59)
Glucose: 89 mg/dL (ref 65–99)
Potassium: 3.5 mmol/L (ref 3.5–5.2)
Sodium: 147 mmol/L — ABNORMAL HIGH (ref 134–144)

## 2017-07-13 LAB — HEPATIC FUNCTION PANEL
ALBUMIN: 4.4 g/dL (ref 3.5–4.8)
ALT: 24 IU/L (ref 0–44)
AST: 26 IU/L (ref 0–40)
Alkaline Phosphatase: 84 IU/L (ref 39–117)
BILIRUBIN TOTAL: 0.3 mg/dL (ref 0.0–1.2)
BILIRUBIN, DIRECT: 0.1 mg/dL (ref 0.00–0.40)
Total Protein: 6.3 g/dL (ref 6.0–8.5)

## 2017-07-13 LAB — CBC WITH DIFFERENTIAL/PLATELET
BASOS ABS: 0 10*3/uL (ref 0.0–0.2)
Basos: 0 %
EOS (ABSOLUTE): 0.1 10*3/uL (ref 0.0–0.4)
Eos: 3 %
Hematocrit: 36.6 % — ABNORMAL LOW (ref 37.5–51.0)
Hemoglobin: 12.6 g/dL — ABNORMAL LOW (ref 13.0–17.7)
IMMATURE GRANS (ABS): 0 10*3/uL (ref 0.0–0.1)
IMMATURE GRANULOCYTES: 0 %
LYMPHS: 31 %
Lymphocytes Absolute: 1.3 10*3/uL (ref 0.7–3.1)
MCH: 29.3 pg (ref 26.6–33.0)
MCHC: 34.4 g/dL (ref 31.5–35.7)
MCV: 85 fL (ref 79–97)
MONOS ABS: 0.5 10*3/uL (ref 0.1–0.9)
Monocytes: 12 %
NEUTROS PCT: 54 %
Neutrophils Absolute: 2.2 10*3/uL (ref 1.4–7.0)
PLATELETS: 199 10*3/uL (ref 150–379)
RBC: 4.3 x10E6/uL (ref 4.14–5.80)
RDW: 12.8 % (ref 12.3–15.4)
WBC: 4.1 10*3/uL (ref 3.4–10.8)

## 2017-07-13 LAB — LIPID PANEL
CHOLESTEROL TOTAL: 207 mg/dL — AB (ref 100–199)
Chol/HDL Ratio: 3.4 ratio (ref 0.0–5.0)
HDL: 61 mg/dL (ref >39)
LDL Calculated: 131 mg/dL — ABNORMAL HIGH (ref 0–99)
TRIGLYCERIDES: 75 mg/dL (ref 0–149)
VLDL CHOLESTEROL CAL: 15 mg/dL (ref 5–40)

## 2017-07-13 LAB — VITAMIN D 25 HYDROXY (VIT D DEFICIENCY, FRACTURES): Vit D, 25-Hydroxy: 42.1 ng/mL (ref 30.0–100.0)

## 2017-08-01 ENCOUNTER — Encounter (HOSPITAL_BASED_OUTPATIENT_CLINIC_OR_DEPARTMENT_OTHER): Payer: Self-pay | Admitting: *Deleted

## 2017-08-01 NOTE — Progress Notes (Signed)
NPO AFTER MN.  ARRIVE AT 0530.  GETTING LAB WORK DONE Friday 08-03-2017 (CBC, CMET, PT/INR, PTT).  CURRENT EKG AND CXR IN CHART AND EPIC.  WILL TAKE AM MEDS W/ SIPS OF WATER DOS AND DO FLEET ENEMA.

## 2017-08-02 ENCOUNTER — Telehealth: Payer: Self-pay | Admitting: *Deleted

## 2017-08-02 NOTE — Telephone Encounter (Signed)
CALLED PATIENT TO REMIND OF LABS FOR 08-03-17 FOR  PROCEDURE ON 08-10-17, SPOKE WITH PATIENT AND HE IS AWARE OF THIS APPT.

## 2017-08-02 NOTE — Telephone Encounter (Signed)
CALLED PATIENT TO REMIND OF LABS FOR 08-03-17 FOR PROCEDURE FOR 08-10-17, SPOKE WITH PATIENT AND HE IS AWARE OF THIS APPT.

## 2017-08-03 DIAGNOSIS — K219 Gastro-esophageal reflux disease without esophagitis: Secondary | ICD-10-CM | POA: Diagnosis not present

## 2017-08-03 DIAGNOSIS — I10 Essential (primary) hypertension: Secondary | ICD-10-CM | POA: Diagnosis not present

## 2017-08-03 DIAGNOSIS — I739 Peripheral vascular disease, unspecified: Secondary | ICD-10-CM | POA: Diagnosis not present

## 2017-08-03 DIAGNOSIS — C61 Malignant neoplasm of prostate: Secondary | ICD-10-CM | POA: Diagnosis not present

## 2017-08-03 DIAGNOSIS — G4730 Sleep apnea, unspecified: Secondary | ICD-10-CM | POA: Diagnosis not present

## 2017-08-03 DIAGNOSIS — M1990 Unspecified osteoarthritis, unspecified site: Secondary | ICD-10-CM | POA: Diagnosis not present

## 2017-08-03 DIAGNOSIS — Z79899 Other long term (current) drug therapy: Secondary | ICD-10-CM | POA: Diagnosis not present

## 2017-08-03 LAB — PROTIME-INR
INR: 1.06
Prothrombin Time: 13.7 seconds (ref 11.4–15.2)

## 2017-08-03 LAB — COMPREHENSIVE METABOLIC PANEL
ALT: 27 U/L (ref 17–63)
AST: 20 U/L (ref 15–41)
Albumin: 4.1 g/dL (ref 3.5–5.0)
Alkaline Phosphatase: 74 U/L (ref 38–126)
Anion gap: 9 (ref 5–15)
BUN: 19 mg/dL (ref 6–20)
CO2: 25 mmol/L (ref 22–32)
Calcium: 9.8 mg/dL (ref 8.9–10.3)
Chloride: 107 mmol/L (ref 101–111)
Creatinine, Ser: 1.13 mg/dL (ref 0.61–1.24)
GFR calc Af Amer: >60 mL/min (ref >60)
GFR calc non Af Amer: >60 mL/min (ref >60)
Glucose, Bld: 88 mg/dL (ref 65–99)
Potassium: 3.8 mmol/L (ref 3.5–5.1)
Sodium: 141 mmol/L (ref 135–145)
Total Bilirubin: 0.7 mg/dL (ref 0.3–1.2)
Total Protein: 6.7 g/dL (ref 6.5–8.1)

## 2017-08-03 LAB — CBC
HCT: 36.5 % — ABNORMAL LOW (ref 39.0–52.0)
Hemoglobin: 12.6 g/dL — ABNORMAL LOW (ref 13.0–17.0)
MCH: 28.8 pg (ref 26.0–34.0)
MCHC: 34.5 g/dL (ref 30.0–36.0)
MCV: 83.5 fL (ref 78.0–100.0)
Platelets: 193 10*3/uL (ref 150–400)
RBC: 4.37 MIL/uL (ref 4.22–5.81)
RDW: 12.7 % (ref 11.5–15.5)
WBC: 4.9 10*3/uL (ref 4.0–10.5)

## 2017-08-03 LAB — APTT: aPTT: 34 seconds (ref 24–36)

## 2017-08-06 NOTE — Progress Notes (Signed)
  Radiation Oncology         [phone] ________________________________  Name: Trevionne Advani MRN: [id number]  Date: 08/06/2017  DOB: December 19, 1940       Prostate Seed Implant  CC:Chipper Herb, MD  Kathie Rhodes, MD  DIAGNOSIS: 76 yo man with stage T1c adenocarcinoma of the prostate with a Gleason's score of 3+4 and a PSA of 9.02     ICD-10-CM   1. Prostate cancer (Impact) C61 DG Chest 2 View    DG Chest 2 View    PROCEDURE: Insertion of radioactive I-125 seeds into the prostate gland.  RADIATION DOSE: 145 Gy, definitive therapy.  TECHNIQUE: Kali Ambler was brought to the operating room with the urologist. He was placed in the dorsolithotomy position. He was catheterized and a rectal tube was inserted. The perineum was shaved, prepped and draped. The ultrasound probe was then introduced into the rectum to see the prostate gland.  TREATMENT DEVICE: A needle grid was attached to the ultrasound probe stand and anchor needles were placed.  3D PLANNING: The prostate was imaged in 3D using a sagittal sweep of the prostate probe. These images were transferred to the planning computer. There, the prostate, urethra and rectum were defined on each axial reconstructed image. Then, the software created an optimized 3D plan and a few seed positions were adjusted. The quality of the plan was reviewed using [facility] information for the target and the following two organs at risk:  Urethra and Rectum.  Then the accepted plan was uploaded to the seed Selectron afterloading unit.  PROSTATE VOLUME STUDY:  Using transrectal ultrasound the volume of the prostate was verified to be 44 cc.  SPECIAL TREATMENT PROCEDURE/SUPERVISION AND HANDLING: The Nucletron FIRST system was used to place the needles under sagittal guidance. A total of 19 needles were used to deposit 64 seeds in the prostate gland. The individual seed activity was 0.498 mCi.  SpaceOAR:  Yes  COMPLEX SIMULATION: At the end of the procedure, an  anterior radiograph of the pelvis was obtained to document seed positioning and count. Cystoscopy was performed to check the urethra and bladder.  MICRODOSIMETRY: At the end of the procedure, the patient was emitting 0.184 mR/hr at 1 meter. Accordingly, he was considered safe for hospital discharge.  PLAN: The patient will return to the radiation oncology clinic for post implant CT dosimetry in three weeks.   ________________________________  Sheral Apley Tammi Klippel, M.D.

## 2017-08-08 DIAGNOSIS — C61 Malignant neoplasm of prostate: Secondary | ICD-10-CM | POA: Diagnosis not present

## 2017-08-09 ENCOUNTER — Telehealth: Payer: Self-pay | Admitting: *Deleted

## 2017-08-09 NOTE — Telephone Encounter (Signed)
Called patient to remind of procedure for 08-10-17, lvm for a return call

## 2017-08-09 NOTE — Telephone Encounter (Signed)
Called patient to remind of procedure for 08-10-17, no answer will call later

## 2017-08-10 ENCOUNTER — Encounter (HOSPITAL_BASED_OUTPATIENT_CLINIC_OR_DEPARTMENT_OTHER)
Admission: RE | Disposition: A | Discharge status: Home / Self Care | Payer: Self-pay | Source: Ambulatory Visit | Attending: Urology

## 2017-08-10 ENCOUNTER — Ambulatory Visit (HOSPITAL_BASED_OUTPATIENT_CLINIC_OR_DEPARTMENT_OTHER): Payer: Medicare Other | Admitting: Anesthesiology

## 2017-08-10 ENCOUNTER — Encounter: Payer: Self-pay | Admitting: Radiation Oncology

## 2017-08-10 ENCOUNTER — Ambulatory Visit (HOSPITAL_COMMUNITY): Payer: Medicare Other

## 2017-08-10 ENCOUNTER — Encounter (HOSPITAL_BASED_OUTPATIENT_CLINIC_OR_DEPARTMENT_OTHER): Payer: Self-pay | Admitting: *Deleted

## 2017-08-10 ENCOUNTER — Ambulatory Visit (HOSPITAL_BASED_OUTPATIENT_CLINIC_OR_DEPARTMENT_OTHER)
Admission: RE | Admit: 2017-08-10 | Discharge: 2017-08-10 | Disposition: A | Discharge status: Home / Self Care | Payer: Medicare Other | Source: Ambulatory Visit | Attending: Urology | Admitting: Urology

## 2017-08-10 DIAGNOSIS — I10 Essential (primary) hypertension: Secondary | ICD-10-CM | POA: Insufficient documentation

## 2017-08-10 DIAGNOSIS — C61 Malignant neoplasm of prostate: Secondary | ICD-10-CM

## 2017-08-10 DIAGNOSIS — I739 Peripheral vascular disease, unspecified: Secondary | ICD-10-CM | POA: Diagnosis not present

## 2017-08-10 DIAGNOSIS — Z79899 Other long term (current) drug therapy: Secondary | ICD-10-CM | POA: Insufficient documentation

## 2017-08-10 DIAGNOSIS — G4730 Sleep apnea, unspecified: Secondary | ICD-10-CM | POA: Insufficient documentation

## 2017-08-10 DIAGNOSIS — E785 Hyperlipidemia, unspecified: Secondary | ICD-10-CM | POA: Diagnosis not present

## 2017-08-10 DIAGNOSIS — K219 Gastro-esophageal reflux disease without esophagitis: Secondary | ICD-10-CM | POA: Diagnosis not present

## 2017-08-10 DIAGNOSIS — M1990 Unspecified osteoarthritis, unspecified site: Secondary | ICD-10-CM | POA: Diagnosis not present

## 2017-08-10 DIAGNOSIS — N400 Enlarged prostate without lower urinary tract symptoms: Secondary | ICD-10-CM | POA: Diagnosis not present

## 2017-08-10 DIAGNOSIS — N529 Male erectile dysfunction, unspecified: Secondary | ICD-10-CM | POA: Diagnosis not present

## 2017-08-10 HISTORY — DX: Personal history of other medical treatment: Z92.89

## 2017-08-10 HISTORY — PX: SPACE OAR INSTILLATION: SHX6769

## 2017-08-10 HISTORY — DX: Hyperlipidemia, unspecified: E78.5

## 2017-08-10 HISTORY — DX: Benign prostatic hyperplasia with lower urinary tract symptoms: N40.1

## 2017-08-10 HISTORY — DX: Personal history of colonic polyps: Z86.010

## 2017-08-10 HISTORY — DX: Nocturia: R35.1

## 2017-08-10 HISTORY — DX: Obstructive sleep apnea (adult) (pediatric): G47.33

## 2017-08-10 HISTORY — DX: Personal history of adenomatous and serrated colon polyps: Z86.0101

## 2017-08-10 HISTORY — DX: Gastro-esophageal reflux disease without esophagitis: K21.9

## 2017-08-10 HISTORY — PX: CYSTOSCOPY: SHX5120

## 2017-08-10 HISTORY — DX: Unspecified osteoarthritis, unspecified site: M19.90

## 2017-08-10 HISTORY — DX: Other amnesia: R41.3

## 2017-08-10 HISTORY — DX: Flushing: R23.2

## 2017-08-10 HISTORY — PX: RADIOACTIVE SEED IMPLANT: SHX5150

## 2017-08-10 SURGERY — INSERTION, RADIATION SOURCE, PROSTATE
Anesthesia: General | Site: Rectum

## 2017-08-10 MED ORDER — PROPOFOL 10 MG/ML IV BOLUS
INTRAVENOUS | Status: DC | PRN
  Administered 2017-08-10: 150 mg via INTRAVENOUS
  Administered 2017-08-10: 50 mg via INTRAVENOUS

## 2017-08-10 MED ORDER — LIDOCAINE 2% (20 MG/ML) 5 ML SYRINGE
INTRAMUSCULAR | Status: AC
  Filled 2017-08-10: qty 5

## 2017-08-10 MED ORDER — ACETAMINOPHEN 325 MG PO TABS
325.0000 mg | ORAL_TABLET | ORAL | Status: DC | PRN
  Filled 2017-08-10: qty 2

## 2017-08-10 MED ORDER — PROPOFOL 10 MG/ML IV BOLUS
INTRAVENOUS | Status: AC
  Filled 2017-08-10: qty 40

## 2017-08-10 MED ORDER — LIDOCAINE 2% (20 MG/ML) 5 ML SYRINGE
INTRAMUSCULAR | Status: DC | PRN
  Administered 2017-08-10: 60 mg via INTRAVENOUS

## 2017-08-10 MED ORDER — FENTANYL CITRATE (PF) 100 MCG/2ML IJ SOLN
25.0000 ug | INTRAMUSCULAR | Status: DC | PRN
  Filled 2017-08-10: qty 1

## 2017-08-10 MED ORDER — TAMSULOSIN HCL 0.4 MG PO CAPS: 0.4000 mg | ORAL_CAPSULE | ORAL | 0 refills | Status: DC

## 2017-08-10 MED ORDER — EPHEDRINE SULFATE 50 MG/ML IJ SOLN
INTRAMUSCULAR | Status: DC | PRN
  Administered 2017-08-10 (×2): 10 mg via INTRAVENOUS

## 2017-08-10 MED ORDER — HYDROCODONE-ACETAMINOPHEN 10-325 MG PO TABS: 1.0000 | ORAL_TABLET | ORAL | 0 refills | Status: DC | PRN

## 2017-08-10 MED ORDER — SODIUM CHLORIDE 0.9 % IJ SOLN
INTRAMUSCULAR | Status: DC | PRN
  Administered 2017-08-10: 10 mL

## 2017-08-10 MED ORDER — FENTANYL CITRATE (PF) 100 MCG/2ML IJ SOLN
INTRAMUSCULAR | Status: AC
  Filled 2017-08-10: qty 2

## 2017-08-10 MED ORDER — CIPROFLOXACIN IN D5W 400 MG/200ML IV SOLN
400.0000 mg | INTRAVENOUS | Status: AC
  Administered 2017-08-10: 400 mg via INTRAVENOUS
  Filled 2017-08-10: qty 200

## 2017-08-10 MED ORDER — DEXAMETHASONE SODIUM PHOSPHATE 10 MG/ML IJ SOLN
INTRAMUSCULAR | Status: AC
  Filled 2017-08-10: qty 1

## 2017-08-10 MED ORDER — EPHEDRINE 5 MG/ML INJ
INTRAVENOUS | Status: AC
  Filled 2017-08-10: qty 10

## 2017-08-10 MED ORDER — FLEET ENEMA 7-19 GM/118ML RE ENEM
1.0000 | ENEMA | Freq: Once | RECTAL | Status: AC
  Administered 2017-08-10: 1 via RECTAL
  Filled 2017-08-10: qty 1

## 2017-08-10 MED ORDER — STERILE WATER FOR IRRIGATION IR SOLN
Status: DC | PRN
  Administered 2017-08-10: 3000 mL

## 2017-08-10 MED ORDER — MIDAZOLAM HCL 2 MG/2ML IJ SOLN
INTRAMUSCULAR | Status: AC
  Filled 2017-08-10: qty 2

## 2017-08-10 MED ORDER — CIPROFLOXACIN HCL 500 MG PO TABS: 500.0000 mg | ORAL_TABLET | Freq: Two times a day (BID) | ORAL | 0 refills | Status: DC

## 2017-08-10 MED ORDER — ONDANSETRON HCL 4 MG/2ML IJ SOLN
INTRAMUSCULAR | Status: AC
  Filled 2017-08-10: qty 2

## 2017-08-10 MED ORDER — DEXAMETHASONE SODIUM PHOSPHATE 10 MG/ML IJ SOLN
INTRAMUSCULAR | Status: DC | PRN
  Administered 2017-08-10: 10 mg via INTRAVENOUS

## 2017-08-10 MED ORDER — FENTANYL CITRATE (PF) 100 MCG/2ML IJ SOLN
INTRAMUSCULAR | Status: DC | PRN
  Administered 2017-08-10 (×2): 25 ug via INTRAVENOUS
  Administered 2017-08-10: 50 ug via INTRAVENOUS

## 2017-08-10 MED ORDER — TAMSULOSIN HCL 0.4 MG PO CAPS
ORAL_CAPSULE | ORAL | Status: AC
  Filled 2017-08-10: qty 1

## 2017-08-10 MED ORDER — ACETAMINOPHEN 160 MG/5ML PO SOLN
325.0000 mg | ORAL | Status: DC | PRN
  Filled 2017-08-10: qty 20.3

## 2017-08-10 MED ORDER — OXYCODONE HCL 5 MG/5ML PO SOLN
5.0000 mg | Freq: Once | ORAL | Status: DC | PRN
  Filled 2017-08-10: qty 5

## 2017-08-10 MED ORDER — ONDANSETRON HCL 4 MG/2ML IJ SOLN
INTRAMUSCULAR | Status: DC | PRN
Start: 2017-08-10 — End: 2017-08-10
  Administered 2017-08-10: 4 mg via INTRAVENOUS

## 2017-08-10 MED ORDER — TAMSULOSIN HCL 0.4 MG PO CAPS
0.4000 mg | ORAL_CAPSULE | Freq: Every day | ORAL | Status: DC
  Administered 2017-08-10: 0.4 mg via ORAL
  Filled 2017-08-10: qty 1

## 2017-08-10 MED ORDER — OXYCODONE HCL 5 MG PO TABS
5.0000 mg | ORAL_TABLET | Freq: Once | ORAL | Status: DC | PRN
  Filled 2017-08-10: qty 1

## 2017-08-10 MED ORDER — IOHEXOL 300 MG/ML  SOLN
INTRAMUSCULAR | Status: DC | PRN
  Administered 2017-08-10: 7 mL

## 2017-08-10 MED ORDER — CIPROFLOXACIN IN D5W 400 MG/200ML IV SOLN
INTRAVENOUS | Status: AC
  Filled 2017-08-10: qty 200

## 2017-08-10 MED ORDER — LACTATED RINGERS IV SOLN
INTRAVENOUS | Status: DC
  Administered 2017-08-10 (×2): via INTRAVENOUS
  Filled 2017-08-10: qty 1000

## 2017-08-10 SURGICAL SUPPLY — 30 items
BAG URINE DRAINAGE (UROLOGICAL SUPPLIES) ×4 IMPLANT
BLADE CLIPPER SURG (BLADE) ×4 IMPLANT
CATH FOLEY 2WAY SLVR  5CC 16FR (CATHETERS) ×1
CATH FOLEY 2WAY SLVR 5CC 16FR (CATHETERS) ×3 IMPLANT
CATH ROBINSON RED A/P 20FR (CATHETERS) ×4 IMPLANT
CLOTH BEACON ORANGE TIMEOUT ST (SAFETY) ×4 IMPLANT
COVER BACK TABLE 60X90IN (DRAPES) ×4 IMPLANT
COVER MAYO STAND STRL (DRAPES) ×4 IMPLANT
DRSG TEGADERM 4X4.75 (GAUZE/BANDAGES/DRESSINGS) ×4 IMPLANT
DRSG TEGADERM 8X12 (GAUZE/BANDAGES/DRESSINGS) ×4 IMPLANT
GAUZE SPONGE 4X4 8PLY STR LF (GAUZE/BANDAGES/DRESSINGS) ×4 IMPLANT
GLOVE BIO SURGEON STRL SZ8 (GLOVE) ×4 IMPLANT
GLOVE BIOGEL PI IND STRL 7.5 (GLOVE) ×9 IMPLANT
GLOVE BIOGEL PI INDICATOR 7.5 (GLOVE) ×3
GLOVE ECLIPSE 8.0 STRL XLNG CF (GLOVE) ×20 IMPLANT
GOWN STRL REUS W/TWL XL LVL3 (GOWN DISPOSABLE) ×8 IMPLANT
HOLDER FOLEY CATH W/STRAP (MISCELLANEOUS) IMPLANT
IMPL SPACEOAR SYSTEM 10ML (MISCELLANEOUS) ×3 IMPLANT
IMPLANT SPACEOAR SYSTEM 10ML (MISCELLANEOUS) ×4
IV NS 1000ML (IV SOLUTION)
IV NS 1000ML BAXH (IV SOLUTION) IMPLANT
KIT RM TURNOVER CYSTO AR (KITS) ×4 IMPLANT
NUCLETRON SELECTSEED I-125 ×256 IMPLANT
PACK CYSTO (CUSTOM PROCEDURE TRAY) ×4 IMPLANT
SURGILUBE 2OZ TUBE FLIPTOP (MISCELLANEOUS) ×4 IMPLANT
SUT BONE WAX W31G (SUTURE) ×4 IMPLANT
SYRINGE 10CC LL (SYRINGE) IMPLANT
TUBE CONNECTING 12X1/4 (SUCTIONS) IMPLANT
UNDERPAD 30X30 INCONTINENT (UNDERPADS AND DIAPERS) ×8 IMPLANT
WATER STERILE IRR 500ML POUR (IV SOLUTION) ×4 IMPLANT

## 2017-08-10 NOTE — H&P (Signed)
HPI: Ricky [name] is a 76 year-old male with prostate cancer.  His prostate cancer was diagnosed 10/10/2016. His PSA at his time of diagnosis was 9.02. His cancer was T!c, Gleason 3+3 = 6 in 3 cores and 3+4 = 7 in 7 cores.   His most recent PSA is 0.36. He has not undergone External Beam Radiation Therapy for treatment. He has undergone Hormonal Therapy for treatment.   He does not have urinary incontinence. He does not have problems with erectile dysfunction. He has not recently had unwanted weight loss. He is not having new bone pain.   Prostate cancer: He had a long history of an elevated but stable PSA. It then rose further in 12/17 to 9.02. No abnormality noted on DRE. Positive family history (father and brother)  TRUS/BX 10/10/16: Prostate volume - 78 cc  Pathology: Adenocarcinoma Gleason score 3+3 = 6 in 3 cores and 3+4 = 7 in 7 cores for a total of 10/12 cores positive.   Interval history 10/17/16: He returns today having undergone a prostate biopsy and reported no significant complications following his prostate biopsy.   Interval history 12/28/2016: Patient reports his obstructive and overactive bladder symptoms have worsened since Lupron was given last month. Currently waiting for ADT to cause atrophy and shrinking of the prostate before undergoing adjunctive therapy. Patient states he is going to have brachytherapy but I am presently unsure about this. Last Lupron was in February. He is complaining of feeling weird and having hot flashes at night. He also states having some intermittent burning. Unfortunately the patient is a poor historian but does continue on tamsulosin twice a day as well as tolterodine. Further try and evaluate root cause of his symptoms, patient also states not drinking much [place] in favor of continuously drinking sweet tea throughout the day and into the night. Denies gross hematuria. He denies fevers.   Interval history 02/27/17: We discussed the fact that in 2/18 he  received a 6 month Lupron injection and that we could potentially perform a prostate ultrasound for volume determination today if he wanted to but he wanted to wait for the full 6 months to achieve the greatest decrease in size of his prostate and then undergo his repeat volume determination with hopes of proceeding with radioactive seed implantation at that time. He has noted that the Lupron has resulted in improvement in his voiding symptoms. He was pleased to see this occurring.   Interval history 04/12/17: He came in today to talk about several concerns. The first is that he is having a great deal of hot flashes. He said they are frequent and he describes them as severe. 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 He was worried that he might be becoming dehydrated but said his urine has not become overly concentrated to suggest this. He said that he is urinating better and was pleased that this was occurring. He also mentioned that a friend of his had gone to [place] where he stayed for 2 months and received proton beam therapy. We discussed the fact that this isn't effective treatment but the IMRT performed here in [place] was equally as effective at treating cancer if that became necessary due to his prostate size.   Interval history 06/07/17: No new complaints today. Still having side effects from Lupron but tolerating these.     ALLERGIES: No Known Drug Allergies    MEDICATIONS: Meloxicam 15 mg tablet 1 unspecified PO  Daily  Meloxicam 15 mg tablet 1 unspecified PO Daily  Tamsulosin Hcl 0.4 mg capsule, ext release 24 hr  Amlodipine Besilate  Doxazosin Mesylate 2 mg tablet  Losartan Potassium 100 mg tablet  Potassium Chloride  Tolterodine Tartrate Er 4 mg capsule, ext release 24 hr 1 capsule PO Q HS  Vitamin D     Notes: Weight 200 lbs  Height 67"   GU PSH: Prostate Needle Biopsy - 10/10/2016      PSH Notes: Oral Surgery Tooth Extraction,  Throat Surgery, Knee Surgery   NON-GU PSH: Dental Surgery Procedure - 2015 Surgical Pathology, Gross And Microscopic Examination For Prostate Needle - 10/10/2016    GU PMH: Prostate Cancer (Stable), His PSA and testosterone will be checked today. I will then plan to perform transrectal ultrasound for volume determination as previously planned in August. - 04/12/2017, (Stable), He is going to return in 8/18 for TRUS to see if his prostate size has decreased below 60 cc and we could then proceed with radioactive seed implantation., - 03/01/2017, - 12/28/2016, I went over his pathology report with him today as well as the significance of his Gleason score, number and location of cores positive and percent of cores positive. We then discussed his Partin table results in detail and the significance of these predictions as far as prognosis and need for further workup are concerned. I then discussed with him the various options available including active surveillance and treatment for cure such as radiation and surgery. We briefly discussed the forms of radiation available. I also gave him written information outlining the disease, its workup and the options for treatment for him to review further. , - 10/17/2016, Bilateral, - 10/12/2016 Testis disorders, other noninflammatory disorders (Improving), Right, His right epididymis was noted to be entirely normal today. This was obviously an inflammatory condition. An ultrasound was therefore not indicated. - 03/01/2017 Acute prostatitis (Acute), He appears to have an acute prostatitis despite the fact that his urine is clear his prostate is quite tender and his symptoms are suggestive of this and start him on him. Antibiotic therapy and also on meloxicam. - 01/09/2017 Benign Neo Left epididymis, Right, The mass in the head of his right epididymis is most likely going to be either a benign lesion or a cyst/spermatocele but this needs to be evaluated further with an ultrasound. -  01/09/2017 BPH w/LUTS (Stable), He has BPH but primarily has irritative voiding symptoms. - 09/20/2016, Benign prostatic hyperplasia with urinary obstruction, - 2016 Nocturia (Stable), He is to have some urgency and urge incontinence. The Vesicare was expensive so I'm going to switch him to tolterodine. - 09/20/2016, Nocturia, - 2016 Urinary Urgency (Stable), In addition to his nocturia he does have some mild urge incontinence. 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 His father died of the disease is 61 years old.   He has some BPH but his only voiding complaint is that of nocturia. He says that he gets up at least 4 times every night and sometimes more frequently than that. He states his stream is fairly strong although  he will double void. He was on an alpha-blocker and has not been taking that. In 11/11 he restarted tamsulosin at a b.i.d. dosage. He was also placed on finasteride which was stopped in 9/14.  Current therapy: Tamsulosin 0.8 mg    Erectile dysfunction: He has had some worsening of his erectile dysfunction and had read that the finasteride can decrease his libido. He wanted to know if he can get off of this medication. He is still using Viagra for his ED.    He does have an inguinal hernia that occasionally gives him a slight "twinge" but overall is not a problem for him.    Gross hematuria: He developed gross hematuria with no change in his voiding pattern or flank pain. Evaluation with a CT scan and cystoscopy in 5/16 revealed an enlarged  prostate and a few sand grain sized bladder stones but no other abnormality.     NON-GU PMH: Flushing (Worsening), I offered to prescribe medication to help with his hot flashes but he declined this. We discussed the fact that these would eventually resolve once the effects of Lupron where off and his testosterone level returns to normal. He was reassured to hear this. - 04/12/2017, He reported to me today that he is having hot flashes and this is associated with a sensation of mild nausea when it occurs at times. I asked him if it was bad enough that he would want to consider medication to help mitigate these hot flashes and he indicated that he did not. I told them that over time once the effects of the Lupron are no longer present the hot flashes will resolve. He was encouraged to hear this., - 03/01/2017 Encounter for general adult medical examination without abnormal findings, Encounter for preventive health examination - 13-Jan-2014 Personal history of other diseases of the circulatory system, History of hypertension - 01/13/13    FAMILY HISTORY: Death In The Family Father - Father Family Health Status Number - Runs In Family Prostate Cancer - Father   SOCIAL HISTORY: Marital Status: Married Preferred Language: English; Ethnicity: Not Hispanic Or Latino; Race: White Current Smoking Status: Patient has never smoked.  Social Drinker.  Drinks 2 caffeinated drinks per day.     Notes: Being A Social Drinker, Alcohol Use, Occupation:, Tobacco Use, Caffeine Use, Marital History - Currently Married   REVIEW OF SYSTEMS:    GU Review Male:   Patient denies frequent urination, hard to postpone urination, burning/ pain with urination, get up at night to urinate, leakage of urine, stream starts and stops, trouble starting your stream, have to strain to urinate , erection problems, and penile pain.  Gastrointestinal (Upper):   Patient denies nausea, vomiting, and indigestion/ heartburn.  Gastrointestinal (Lower):    Patient denies diarrhea and constipation.  Constitutional:   Patient denies fever, night sweats, weight loss, and fatigue.  Skin:   Patient denies skin rash/ lesion and itching.  Eyes:   Patient denies blurred vision and double vision.  Ears/ Nose/ Throat:   Patient denies sore throat and sinus problems.  Hematologic/Lymphatic:   Patient denies swollen glands and easy bruising.  Cardiovascular:   Patient denies leg swelling and chest pains.  Respiratory:   Patient denies cough and shortness of breath.  Endocrine:   Patient denies excessive thirst.  Musculoskeletal:   Patient denies back pain and joint pain.  Neurological:   Patient denies headaches and dizziness.  Psychologic:   Patient denies depression and anxiety.   VITAL SIGNS:  Height  67 in / 170.18 cm  BP 120/74 mmHg  Pulse 88 /min   Physical Exam  Constitutional: Well nourished and well developed . No acute distress.   ENT:. The ears and nose are normal in appearance.   Neck: The appearance of the neck is normal and no neck mass is present.   Pulmonary: No respiratory distress and normal respiratory rhythm and effort.   Cardiovascular: Heart rate and rhythm are normal . No peripheral edema.   Abdomen: The abdomen is soft and nontender. No masses are palpated. No CVA tenderness. No hernias are palpable. No hepatosplenomegaly noted.   Rectal: Rectal exam demonstrates normal sphincter tone, no tenderness and no masses. Estimated prostate size is 1+. The prostate has no nodularity and is not tender. The left seminal vesicle is nonpalpable. The right seminal vesicle is nonpalpable. The perineum is normal on inspection.   Genitourinary: Examination of the penis demonstrates an indwelling catheter, but no discharge, no masses, no lesions and a normal meatus. The penis is uncircumcised. The scrotum is without lesions. The right epididymis is palpably normal and non-tender. The left epididymis is palpably normal and non-tender. The  right testis is non-tender and without masses. The left testis is non-tender and without masses.   Lymphatics: The femoral and inguinal nodes are not enlarged or tender.   Skin: Normal skin turgor, no visible rash and no visible skin lesions.   Neuro/Psych:. Mood and affect are appropriate.     PAST DATA REVIEWED:  Source Of History:  Patient  Lab Test Review:   PSA  Records Review:   Previous Patient Records, POC Tool   05/31/17 04/12/17 09/13/16 06/15/16 01/12/15 12/10/14 10/27/14 09/08/14  PSA  Total PSA 0.36 ng/mL 0.51 ng/mL 9.02  10.80  7.18  7.36  6.29  7.77   Free PSA   1.23   1.06  1.23  1.06  1.32   % Free PSA   14   15  17  17  17      04/12/17  Hormones  Testosterone, Total <10 ng/dL    PROCEDURES:         Prostate Ultrasound - 93790  Length:5.13cm Height:4.27cm Width:4.72cm Volume:54.12ml  Prostate:  1)Several Hyperechoic areas within prostate largest measuring 0.87cm and 0.43cm-----2)Several Cystic areas within prostate largest measuring 0.49cm      The transrectal ultrasound probe is introduced into the rectum, and the prostate is visualized. Ultrasonography is utilized throughout the procedure. At the conclusion of the procedure, the ultrasound probe is removed. The patient tolerates the procedure without complication.  He has had a significant decrease in the size of his prostate.   ASSESSMENT/PLAN:      ICD-10 Details  1 GU:   Prostate Cancer - C61 Stable - His prostate has not been downsized by approximately 30% to 54 cc. He should be able to undergo seed implantation at this time and has an appointment with Dr. Tammi Klippel tomorrow.  2 NON-GU:   Flushing - R23.2 Stable - He is still being bothered by the flushing but is tolerating it. His current testosterone level is castrate.   He is scheduled to undergo I-125 seed implant with placement of SpaceOAR.

## 2017-08-10 NOTE — Anesthesia Procedure Notes (Signed)
Procedure Name: LMA Insertion Date/Time: 08/10/2017 7:44 AM Performed by: Wanita Chamberlain Pre-anesthesia Checklist: Patient identified, Emergency Drugs available, Suction available, Patient being monitored and Timeout performed Patient Re-evaluated:Patient Re-evaluated prior to induction Oxygen Delivery Method: Circle system utilized Preoxygenation: Pre-oxygenation with 100% oxygen Induction Type: IV induction Ventilation: Mask ventilation without difficulty LMA: LMA inserted LMA Size: 4.0 Number of attempts: 1 Placement Confirmation: positive ETCO2 and breath sounds checked- equal and bilateral Tube secured with: Tape Dental Injury: Teeth and Oropharynx as per pre-operative assessment

## 2017-08-10 NOTE — Op Note (Signed)
PATIENT:  Ricky [name]  PRE-OPERATIVE DIAGNOSIS:  Adenocarcinoma of the prostate  POST-OPERATIVE DIAGNOSIS:  Same  PROCEDURE:  1. I-125 radioactive seed implantation 2. Cystoscopy  3. Placement of SpaceOAR  SURGEON:  Surgeon(s): Claybon Jabs  Radiation oncologist: Dr. Tyler Pita  ANESTHESIA:  General  EBL:  Minimal  DRAINS: None  INDICATION: Ricky [name] is a 76 year old male with biopsy-proven adenocarcinoma of the prostate.  His prostate was initially of a size that precluded placement of radioactive seeds and therefore he underwent Lupron therapy with significant downsizing of his prostate making seed implantation possible.  He presents today for I-125 seed implant and SpaceOAR.  Description of procedure: After informed consent the patient was brought to the major OR, placed on the table and administered general anesthesia. He was then moved to the modified lithotomy position with his perineum perpendicular to the floor. His perineum and genitalia were then sterilely prepped. An official timeout was then performed. A 16 French Foley catheter was then placed in the bladder and filled with dilute contrast, a rectal tube was placed in the rectum and the transrectal ultrasound probe was placed in the rectum and affixed to the stand. He was then sterilely draped.  Real time ultrasonography was used along with the seed planning software [employer] Prostate vs. 4.2.2.4. This was used to develop the seed plan including the number of needles as well as number of seeds required for complete and adequate coverage. Real-time ultrasonography was then used along with the previously developed plan and the Nucletron device to implant a total of 64 seeds using 19 needles. This proceeded without difficulty or complication.   I then proceeded with placement of SpaceOARby introducing a needle with the bevel angled inferiorly approximately 2 cm superior to the anus. This was  angled downward and under direct ultrasound was placed within the space between the prostatic capsule and rectum. This was confirmed with a small amount of sterile saline injected and this was performed under direct ultrasound. I then attached the SpaceOARto the needle and injected this in the space between the prostate and rectum with good placement noted.  A Foley catheter was then removed as well as the transrectal ultrasound probe and rectal probe. Flexible cystoscopy was then performed using the 17 French flexible scope which revealed a normal urethra throughout its length down to the sphincter which appeared intact. The prostatic urethra revealed bilobar hypertrophy but no evidence of obstruction, seeds, spacers or lesions. The bladder was then entered and fully and systematically inspected. The ureteral orifices were noted to be of normal configuration and position. The mucosa revealed no evidence of tumors. There were also no stones identified within the bladder. I noted no seeds or spacers on the floor of the bladder and retroflexion of the scope revealed no seeds protruding from the base of the prostate.  The cystoscope was then removed and the patient was awakened and taken to recovery room in stable and satisfactory condition. He tolerated procedure well and there were no intraoperative complications.

## 2017-08-10 NOTE — Anesthesia Preprocedure Evaluation (Addendum)
Anesthesia Evaluation  Patient identified by MRN, date of birth, ID band Patient awake    Reviewed: Allergy & Precautions, NPO status , Patient's Chart, lab work & pertinent test results  History of Anesthesia Complications Negative for: history of anesthetic complications  Airway Mallampati: II  TM Distance: >3 FB Neck ROM: Full    Dental  (+) Teeth Intact, Chipped,    Pulmonary neg shortness of breath, sleep apnea , neg recent URI,    breath sounds clear to auscultation       Cardiovascular hypertension, Pt. on medications (-) angina+ Peripheral Vascular Disease  (-) Past MI and (-) CHF (-) dysrhythmias  Rhythm:Regular     Neuro/Psych  Headaches, negative psych ROS   GI/Hepatic Neg liver ROS, GERD  Controlled and Medicated,  Endo/Other  negative endocrine ROS  Renal/GU Renal InsufficiencyRenal disease     Musculoskeletal  (+) Arthritis , Osteoarthritis,    Abdominal   Peds  Hematology negative hematology ROS (+)   Anesthesia Other Findings   Reproductive/Obstetrics                            Anesthesia Physical Anesthesia Plan  ASA: II  Anesthesia Plan: General   Post-op Pain Management:    Induction: Intravenous  PONV Risk Score and Plan: 2 and Ondansetron and Dexamethasone  Airway Management Planned: Oral ETT and LMA  Additional Equipment: None  Intra-op Plan:   Post-operative Plan: Extubation in OR  Informed Consent: I have reviewed the patients History and Physical, chart, labs and discussed the procedure including the risks, benefits and alternatives for the proposed anesthesia with the patient or authorized representative who has indicated his/her understanding and acceptance.   Dental advisory given  Plan Discussed with: CRNA and Surgeon  Anesthesia Plan Comments:         Anesthesia Quick Evaluation

## 2017-08-10 NOTE — Discharge Instructions (Signed)

## 2017-08-10 NOTE — Anesthesia Postprocedure Evaluation (Signed)
Anesthesia Post Note  Patient: Jadriel Saxer III  Procedure(s) Performed: RADIOACTIVE SEED IMPLANT/BRACHYTHERAPY IMPLANT (N/A Prostate) SPACE OAR INSTILLATION (N/A Rectum) CYSTOSCOPY FLEXIBLE (N/A Bladder)     Patient location during evaluation: PACU Anesthesia Type: General Level of consciousness: awake and alert Pain management: pain level controlled Vital Signs Assessment: post-procedure vital signs reviewed and stable Respiratory status: spontaneous breathing, nonlabored ventilation, respiratory function stable and patient connected to nasal cannula oxygen Cardiovascular status: blood pressure returned to baseline and stable Postop Assessment: no apparent nausea or vomiting Anesthetic complications: no    Last Vitals:  Vitals:   08/10/17 0945 08/10/17 1000  BP: 134/83 (!) 142/81  Pulse: 78 76  Resp: 17 19  Temp:    SpO2: 99% 96%    Last Pain:  Vitals:   08/10/17 1000  TempSrc:   PainSc: 3                  Maylen Waltermire

## 2017-08-10 NOTE — Transfer of Care (Signed)
Immediate Anesthesia Transfer of Care Note  Patient: Ricky [name]  Procedure(s) Performed: RADIOACTIVE SEED IMPLANT/BRACHYTHERAPY IMPLANT (N/A Prostate) SPACE OAR INSTILLATION (N/A Rectum) CYSTOSCOPY FLEXIBLE (N/A Bladder)  Patient Location: PACU  Anesthesia Type:General  Level of Consciousness: awake, alert , oriented and patient cooperative  Airway & Oxygen Therapy: Patient Spontanous Breathing and Patient connected to nasal cannula oxygen  Post-op Assessment: Report given to RN and Post -op Vital signs reviewed and stable  Post vital signs: Reviewed and stable  Last Vitals:  Vitals:   08/10/17 0559  BP: (!) 142/77  Pulse: 66  Resp: 16  Temp: 37 C  SpO2: 98%    Last Pain:  Vitals:   08/10/17 0559  TempSrc: Oral      Patients Stated Pain Goal: 7 (21/74/71 5953)  Complications: No apparent anesthesia complications

## 2017-08-13 ENCOUNTER — Encounter (HOSPITAL_BASED_OUTPATIENT_CLINIC_OR_DEPARTMENT_OTHER): Payer: Self-pay | Admitting: Urology

## 2017-08-15 DIAGNOSIS — L820 Inflamed seborrheic keratosis: Secondary | ICD-10-CM | POA: Diagnosis not present

## 2017-08-15 DIAGNOSIS — L570 Actinic keratosis: Secondary | ICD-10-CM | POA: Diagnosis not present

## 2017-08-15 DIAGNOSIS — D225 Melanocytic nevi of trunk: Secondary | ICD-10-CM | POA: Diagnosis not present

## 2017-08-15 DIAGNOSIS — L821 Other seborrheic keratosis: Secondary | ICD-10-CM | POA: Diagnosis not present

## 2017-08-16 ENCOUNTER — Other Ambulatory Visit: Payer: Self-pay | Admitting: Urology

## 2017-08-16 DIAGNOSIS — C61 Malignant neoplasm of prostate: Secondary | ICD-10-CM

## 2017-09-03 ENCOUNTER — Other Ambulatory Visit: Payer: Self-pay | Admitting: Family Medicine

## 2017-09-03 DIAGNOSIS — C61 Malignant neoplasm of prostate: Secondary | ICD-10-CM | POA: Diagnosis not present

## 2017-09-05 ENCOUNTER — Telehealth: Payer: Self-pay | Admitting: *Deleted

## 2017-09-05 ENCOUNTER — Ambulatory Visit: Payer: Medicare Other | Admitting: Urology

## 2017-09-05 ENCOUNTER — Ambulatory Visit (HOSPITAL_COMMUNITY)
Admission: RE | Admit: 2017-09-05 | Discharge: 2017-09-05 | Disposition: A | Discharge status: Home / Self Care | Payer: Medicare Other | Source: Ambulatory Visit | Attending: Urology | Admitting: Urology

## 2017-09-05 ENCOUNTER — Ambulatory Visit
Admission: RE | Admit: 2017-09-05 | Discharge: 2017-09-05 | Disposition: A | Discharge status: Home / Self Care | Payer: Medicare Other | Source: Ambulatory Visit | Attending: Radiation Oncology | Admitting: Radiation Oncology

## 2017-09-05 ENCOUNTER — Encounter: Payer: Self-pay | Admitting: Radiation Oncology

## 2017-09-05 ENCOUNTER — Other Ambulatory Visit: Payer: Self-pay

## 2017-09-05 VITALS — BP 136/93 | HR 74 | Resp 18 | Wt 207.2 lb

## 2017-09-05 DIAGNOSIS — C61 Malignant neoplasm of prostate: Secondary | ICD-10-CM | POA: Diagnosis not present

## 2017-09-05 DIAGNOSIS — Z79899 Other long term (current) drug therapy: Secondary | ICD-10-CM | POA: Diagnosis not present

## 2017-09-05 NOTE — Telephone Encounter (Signed)
Called patient to remind of appts. For 09-05-17, lvm for a return call

## 2017-09-05 NOTE — Progress Notes (Signed)
  Radiation Oncology         [phone] ________________________________  Name: Ricky [name] MRN: [id number]  Date: 09/05/2017  DOB: Jun 17, 1941  COMPLEX SIMULATION NOTE  NARRATIVE:  The patient was brought to the [facility] suite today following prostate seed implantation approximately one month ago.  Identity was confirmed.  All relevant records and images related to the planned course of therapy were reviewed.  Then, the patient was set-up supine.  CT images were obtained.  The CT images were loaded into the planning software.  Then the prostate and rectum were contoured.  Treatment planning then occurred.  The implanted iodine 125 seeds were identified by the physics staff for projection of radiation distribution  I have requested : 3D Simulation  I have requested a DVH of the following structures: Prostate and rectum.    ________________________________  Sheral Apley Tammi Klippel, M.D.  This document serves as a record of services personally performed by Tyler Pita, MD. It was created on his behalf by Rae Lips, a trained medical scribe. The creation of this record is based on the scribe's personal observations and the provider's statements to them. This document has been checked and approved by the attending provider.

## 2017-09-05 NOTE — Progress Notes (Signed)
Patient returns today for post seed follow up. Patient evaluated by Jiles Crocker on 09/03/2017. Pre seed IPSS 23. Post seed IPSS 24. Reports mild dysuria. Denies hematuria. Reports urinary urgency with leakage and incontinence. Reports taking Flomax bid with the second dose at 10 pm. In addition, patient confirms he is taking Detrol. Patient received Lupron 45 in February. Patient complains of hot flashes with nausea since receiving Lupron. Reports stinging perineal pain with prolonged sitting that is relieved with position change. Patient to follow up with Dr. Karsten Ro in three months. Patient scheduled for MRI today at 1300 to confirm spaceoar placement.   BP (!) 136/93 (BP Location: Left Arm, Patient Position: Sitting, Cuff Size: Normal)   Pulse 74   Resp 18   Wt 207 lb 3.2 oz (94 kg)   SpO2 100%   BMI 32.45 kg/m  Wt Readings from Last 3 Encounters:  09/05/17 207 lb 3.2 oz (94 kg)  08/10/17 209 lb (94.8 kg)  07/12/17 203 lb (92.1 kg)

## 2017-09-05 NOTE — Progress Notes (Signed)
Radiation Oncology         [phone]) [phone] ________________________________  Name: Ricky [name] MRN: [id number]  Date: 09/05/2017  DOB: 01/13/1941  Follow-Up Visit Note  CC: Chipper Herb, MD  Kathie Rhodes, MD  Diagnosis:   76 y.o. 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  Interval Since Last Radiation:  4 weeks - Insertion of radioactive I-125 seeds in the prostate gland, 145 Gy definitive therapy, with SpaceOAR  Narrative:  The patient returns today for routine follow-up.  He is complaining of increased urinary frequency and urinary hesitation symptoms. He filled out a questionnaire regarding urinary function today providing and overall IPSS score of 24 characterizing his symptoms as severe with mild dysuria at the end of his stream, frequency, and urgency with occasional incontinence if he postpones urination.  He denies hematuria. He is most bothered with nocturia x6/night. His pre-implant score was 23. He denies any bowel symptoms. He is using Flomax twice a day with his second dose at 10 PM. He was also recently prescribed Detrol.  He reports stinging perineal pain with prolonged sitting that is relieved with position change. He has been advised to follow up with Dr. Karsten Ro in three months but is unsure of the appointment date. He is scheduled for MRI today at 1300 to confirm spaceoar placement.   ALLERGIES:  has No Known Allergies.  Meds: Current Outpatient Medications  Medication Sig Dispense Refill  . amLODipine (NORVASC) 10 MG tablet TAKE 1 TABLET BY MOUTH DAILY (Patient taking differently: TAKE 1 TABLET BY MOUTH DAILY--- takes in pm) 90 tablet 0  . Cholecalciferol (VITAMIN D PO) Take 1 capsule by mouth daily.     . ciprofloxacin (CIPRO) 500 MG tablet Take 1 tablet (500 mg total) by mouth 2 (two) times daily. 6 tablet 0  . donepezil (ARICEPT) 10 MG tablet TAKE 1 TABLET(10 MG) BY  MOUTH AT BEDTIME 90 tablet 0  . doxazosin (CARDURA) 2 MG tablet TAKE 1 TABLET BY MOUTH DAILY (Patient taking differently: TAKE 1 TABLET BY MOUTH DAILY--- takes qhs) 90 tablet 0  . esomeprazole (NEXIUM) 20 MG capsule Take 20 mg by mouth every evening.     Marland Kitchen FIBER FORMULA PO Take 3 capsules by mouth 2 (two) times daily.    . fluticasone (FLONASE) 50 MCG/ACT nasal spray One to 2 sprays each nostril at bedtime and stay on this medication for that at least the next 4-6 weeks 16 g 6  . HYDROcodone-acetaminophen (NORCO) 10-325 MG tablet Take 1-2 tablets by mouth every 4 (four) hours as needed for moderate pain. Maximum dose per 24 hours - 8 pills 8 tablet 0  . losartan (COZAAR) 100 MG tablet TAKE 1 TABLET BY MOUTH DAILY (Patient taking differently: TAKE 1 TABLET BY MOUTH DAILY---takes  in pm) 90 tablet 0  . potassium chloride SA (K-DUR,KLOR-CON) 20 MEQ tablet TAKE 1 TABLET(20 MEQ) BY MOUTH THREE TIMES DAILY 90 tablet 0  . tamsulosin (FLOMAX) 0.4 MG CAPS capsule TAKE ONE CAPSULE BY MOUTH TWICE DAILY 180 capsule 0  . tamsulosin (FLOMAX) 0.4 MG CAPS capsule Take 1 capsule (0.4 mg total) by mouth daily after supper. 7 capsule 0  . tolterodine (DETROL LA) 4 MG 24 hr capsule TK ONE C PO QHS  10   No current facility-administered medications for this encounter.    Review of Systems: A 10+ POINT REVIEW OF SYSTEMS WAS OBTAINED including neurology,  dermatology, psychiatry, cardiac, respiratory, lymph, extremities, GI, GU, Musculoskeletal, constitutional, breasts, reproductive, HEENT.  All pertinent positives are noted in the HPI.  All others are negative.  Physical Findings: In general this is a well appearing Caucasian male in no acute distress. He's alert and oriented x4 and appropriate throughout the examination. Cardiopulmonary assessment is negative for acute distress and he exhibits normal effort.   Lab Findings: Lab Results  Component Value Date   WBC 4.9 08/03/2017   HGB 12.6 (L) 08/03/2017   HCT 36.5  (L) 08/03/2017   MCV 83.5 08/03/2017   PLT 193 08/03/2017    Radiographic Findings:  Patient underwent CT imaging in our clinic for post implant dosimetry. The CT was reviewed by Dr. Tammi Klippel and appears to demonstrate an adequate distribution of radioactive seeds throughout the prostate gland. There are no seeds in or near the rectum. He will have a prostate MRI following his appointment today and these images will be fused with the CT scan for further analysis. We suspect the final radiation plan and dosimetry will show appropriate coverage of the prostate gland.   Impression/Plan: The patient is recovering from the effects of radiation. His urinary symptoms should gradually improve over the next 4-6 months. We talked about this today. He is encouraged by his improvement already and is otherwise pleased with his outcome. We discussed taking 2 Flomax capsules at dinner to see if this will help eliminate some of the nocturia. We also talked about long-term follow-up for prostate cancer following seed implant. He understands that ongoing PSA determinations and digital rectal exams will help perform surveillance to rule out disease recurrence. He has a follow up appointment scheduled with Dr. Karsten Ro in February but is unsure of the specific date/time. He understands what to expect with his PSA measures. Patient was also educated today about some of the long-term effects from radiation including a small risk for rectal bleeding and possibly erectile dysfunction. We talked about some of the general management approaches to these potential complications. However, I did encourage the patient to contact our office or return at any point if he has questions or concerns related to his previous radiation and prostate cancer.    Nicholos Johns, PA-C    Tyler Pita, MD  [facility] Direct Dial: [phone]  Fax: [phone] [email or web address]  Skype  LinkedIn    Page Me    This document serves as a record of services personally performed by Tyler Pita, MD and Freeman Caldron, PA-C. It was created on their behalf by Rae Lips, a trained medical scribe. The creation of this record is based on the scribe's personal observations and the providers' statements to them. This document has been checked and approved by the attending providers.

## 2017-09-18 ENCOUNTER — Encounter: Payer: Self-pay | Admitting: Radiation Oncology

## 2017-09-24 ENCOUNTER — Ambulatory Visit: Payer: Medicare Other | Admitting: Adult Health

## 2017-09-24 ENCOUNTER — Encounter: Payer: Self-pay | Admitting: Adult Health

## 2017-09-24 VITALS — BP 134/78 | HR 71 | Ht 67.0 in | Wt 213.2 lb

## 2017-09-24 DIAGNOSIS — R413 Other amnesia: Secondary | ICD-10-CM

## 2017-09-24 NOTE — Progress Notes (Signed)
PATIENT: Ricky [name] DOB: October 23, 1940  REASON FOR VISIT: follow up HISTORY FROM: patient  HISTORY OF PRESENT ILLNESS: Today 09/24/17  Ricky [name] is a 76 year old male with a history of memory disturbance.  He returns today for follow-up.  He remains on Aricept 10 mg at bedtime.  He feels that his memory has remained stable.  He lives at home with his wife.  He is able to complete all ADLs independently.  He operates a Teacher, music without difficulty.  He is still he reports that he does not sleep well because he has to get up and go to the bathroom frequently.  Receiving treatment for prostate cancer.  He reports that he was on hormone therapy for several months and is now doing the radiation seeds.  He reports that he continues to have episodes of fatigue and hot flashes.  He is on Megace.  He reports that these episodes are infrequent.  He returns today for an evaluation.  HISTORY 03/23/17: Ricky [name] is a 76 year old right-handed white male with a history of a mild memory disorder. The patient has been placed on Aricept, he thinks that this is causing some troubles with gait instability and dizziness, but he believes that his memory has improved on the medication. The patient is not having any nausea or vomiting or diarrhea. He recently was diagnosed with prostate cancer, he is on hormone therapy, he is having hot flashes because of this. The patient reports some left shoulder pain and some low back pain that is chronic in nature. He also has a history of nocturnal leg cramps that is mainly affecting the feet. Otherwise, he seems to be doing fairly well. He is considering coming off the Aricept to see if this is the cause of his dizziness and gait instability. He is pleased with response that he has had on the medication.   REVIEW OF SYSTEMS: Out of a complete 14 system review of symptoms, the patient complains only of the following symptoms, and all other reviewed systems are  negative.  See HPI  ALLERGIES: No Known Allergies  HOME MEDICATIONS: Outpatient Medications Prior to Visit  Medication Sig Dispense Refill  . amLODipine (NORVASC) 10 MG tablet TAKE 1 TABLET BY MOUTH DAILY (Patient taking differently: TAKE 1 TABLET BY MOUTH DAILY--- takes in pm) 90 tablet 0  . Cholecalciferol (VITAMIN D PO) Take 1 capsule by mouth daily.     Marland Kitchen donepezil (ARICEPT) 10 MG tablet TAKE 1 TABLET(10 MG) BY MOUTH AT BEDTIME 90 tablet 0  . doxazosin (CARDURA) 2 MG tablet TAKE 1 TABLET BY MOUTH DAILY (Patient taking differently: TAKE 1 TABLET BY MOUTH DAILY--- takes qhs) 90 tablet 0  . esomeprazole (NEXIUM) 20 MG capsule Take 20 mg by mouth every evening.     Marland Kitchen FIBER FORMULA PO Take 3 capsules by mouth 2 (two) times daily.    . fluticasone (FLONASE) 50 MCG/ACT nasal spray One to 2 sprays each nostril at bedtime and stay on this medication for that at least the next 4-6 weeks 16 g 6  . HYDROcodone-acetaminophen (NORCO) 10-325 MG tablet Take 1-2 tablets by mouth every 4 (four) hours as needed for moderate pain. Maximum dose per 24 hours - 8 pills 8 tablet 0  . losartan (COZAAR) 100 MG tablet TAKE 1 TABLET BY MOUTH DAILY (Patient taking differently: TAKE 1 TABLET BY MOUTH DAILY---takes  in pm) 90 tablet 0  . megestrol (MEGACE) 20 MG tablet TK 1 T PO BID  5  . omeprazole (PRILOSEC) 40 MG capsule Take 40 mg by mouth.    . potassium chloride SA (K-DUR,KLOR-CON) 20 MEQ tablet TAKE 1 TABLET(20 MEQ) BY MOUTH THREE TIMES DAILY 90 tablet 0  . tamsulosin (FLOMAX) 0.4 MG CAPS capsule TAKE ONE CAPSULE BY MOUTH TWICE DAILY 180 capsule 0  . tamsulosin (FLOMAX) 0.4 MG CAPS capsule Take 1 capsule (0.4 mg total) by mouth daily after supper. 7 capsule 0  . tolterodine (DETROL LA) 4 MG 24 hr capsule TK ONE C PO QHS  10   No facility-administered medications prior to visit.     PAST MEDICAL HISTORY: Past Medical History:  Diagnosis Date  . Chronic cough   . Deviated nasal septum   . Enlarged  prostate with lower urinary tract symptoms (LUTS)   . Family history of prostate cancer   . GERD (gastroesophageal reflux disease)   . History of adenomatous polyp of colon    2008; 2011  . History of exercise stress test    01-14-2009 by dr hochrein-- normal w/ no ischemia  . Hot flashes    DUE TO HORMONE THERAPY FOR PROSTATE CANCER  . Hyperlipidemia   . Hypertension   . Mild memory disturbance    per dr Jannifer Franklin note (neurologist)  . Nocturia more than twice per night   . Nocturnal leg cramps 03/23/2017  . OA (osteoarthritis)   . OSA (obstructive sleep apnea)    moderate osa per study 02-12-2017  cpap recommended--- per pt never heard back from doctor for obtaining cpap  . Prostate cancer [facility]) urologist-  dr ottelin/  oncologist-  dr Tammi Klippel   dx 10-10-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, Gleason 3+4, PSA 9.02, vol 78cc started ADT to decrease size;  08/ 2018 prostate decrease size vol 46cc and PSA 0.36--- scheduled for radiative prostate seed implants    PAST SURGICAL HISTORY: Past Surgical History:  Procedure Laterality Date  . CATARACT EXTRACTION W/ INTRAOCULAR LENS  IMPLANT, BILATERAL  2016  . COLONOSCOPY  last one 08-08-2013  . CYSTOSCOPY N/A 08/10/2017   Procedure: CYSTOSCOPY FLEXIBLE;  Surgeon: Kathie Rhodes, MD;  Location: [facility];  Service: Urology;  Laterality: N/A;  NO SEEDS FOUND IN BLADDER  . HAND SURGERY Left 1992  . KNEE ARTHROSCOPY Bilateral 1960's and 70's  . PROSTATE BIOPSY  10-10-2016   dr Karsten Ro office  . RADIOACTIVE SEED IMPLANT N/A 08/10/2017   Procedure: RADIOACTIVE SEED IMPLANT/BRACHYTHERAPY IMPLANT;  Surgeon: Kathie Rhodes, MD;  Location: [facility];  Service: Urology;  Laterality: N/A;    64   SEEDS IMPLANTED  . ROTATOR CUFF REPAIR Right 2004  . SPACE OAR INSTILLATION N/A 08/10/2017   Procedure: SPACE OAR INSTILLATION;  Surgeon: Kathie Rhodes, MD;  Location[facility];  Service: Urology;  Laterality: N/A;  . TOE SURGERY Left  1970   little toe  . TOTAL KNEE ARTHROPLASTY Bilateral 01-25-2009   dr Wynelle Link [facility]    FAMILY HISTORY: Family History  Problem Relation Age of Onset  . Healthy Mother   . Prostate cancer Father        Dx 61s; Deceased 4  . Prostate cancer Brother 40       currently 27  . Alcohol abuse Brother   . Bipolar disorder Brother   . Prostate cancer Paternal Uncle        unsure of age; possibly 2nd uncle also has prostate ca  . Prostate cancer Maternal Uncle        unsure  of age  . Breast cancer Maternal Aunt        unsure of age  . Colon cancer Neg Hx   . Esophageal cancer Neg Hx   . Rectal cancer Neg Hx   . Stomach cancer Neg Hx     SOCIAL HISTORY: Social History   Socioeconomic History  . Marital status: Married    Spouse name: [name]  . Number of children: 3  . Years of education: BA  . Highest education level: Not on file  Social Needs  . Financial resource strain: Not on file  . Food insecurity - worry: Not on file  . Food insecurity - inability: Not on file  . Transportation needs - medical: Not on file  . Transportation needs - non-medical: Not on file  Occupational History  . Occupation: Retired  Tobacco Use  . Smoking status: Never Smoker  . Smokeless tobacco: Never Used  Substance and Sexual Activity  . Alcohol use: Yes    Comment: rare  . Drug use: No  . Sexual activity: Yes    Partners: Female    Comment: vasectomy  Other Topics Concern  . Not on file  Social History Narrative   Lives at home w/ his wife   Right-handed   Caffeine: none      PHYSICAL EXAM  Vitals:   09/24/17 0840  BP: 134/78  Pulse: 71  Weight: 213 lb 3.2 oz (96.7 kg)  Height: 5\' 7"  (1.702 m)   Body mass index is 33.39 kg/m.   MMSE - Mini Mental State Exam 03/23/2017 12/20/2016 09/22/2016  Not completed: - - -  Orientation to time 3 4 4   Orientation to Place 5 5 3   Registration 3 3 3   Attention/ Calculation 5 5 3   Recall 3 3 3   Language- name 2 objects 2 2 2    Language- repeat 1 1 1   Language- follow 3 step command 3 3 3   Language- read & follow direction 1 1 1   Write a sentence 1 1 1   Copy design 1 1 1   Total score 28 29 25      Generalized: Well developed, in no acute distress   Neurological examination  Mentation: Alert oriented to time, place, history taking. Follows all commands speech and language fluent Cranial nerve II-XII: Pupils were equal round reactive to light. Extraocular movements were full, visual field were full on confrontational test. Facial sensation and strength were normal. Uvula tongue midline. Head turning and shoulder shrug  were normal and symmetric. Motor: The motor testing reveals 5 over 5 strength of all 4 extremities. Good symmetric motor tone is noted throughout.  Sensory: Sensory testing is intact to soft touch on all 4 extremities. No evidence of extinction is noted.  Coordination: Cerebellar testing reveals good finger-nose-finger and heel-to-shin bilaterally.  Gait and station: Gait is normal. Tandem gait is normal. Romberg is negative. No drift is seen.  Reflexes: Deep tendon reflexes are symmetric and normal bilaterally.   DIAGNOSTIC DATA (LABS, IMAGING, TESTING) - I reviewed patient records, labs, notes, testing and imaging myself where available.  Lab Results  Component Value Date   WBC 4.9 08/03/2017   HGB 12.6 (L) 08/03/2017   HCT 36.5 (L) 08/03/2017   MCV 83.5 08/03/2017   PLT 193 08/03/2017      Component Value Date/Time   NA 141 08/03/2017 0936   NA 147 (H) 07/12/2017 0940   K 3.8 08/03/2017 0936   CL 107 08/03/2017 0936   CO2 25 08/03/2017  0936   GLUCOSE 88 08/03/2017 0936   BUN 19 08/03/2017 0936   BUN 26 07/12/2017 0940   CREATININE 1.13 08/03/2017 0936   CALCIUM 9.8 08/03/2017 0936   PROT 6.7 08/03/2017 0936   PROT 6.3 07/12/2017 0940   ALBUMIN 4.1 08/03/2017 0936   ALBUMIN 4.4 07/12/2017 0940   AST 20 08/03/2017 0936   ALT 27 08/03/2017 0936   ALKPHOS 74 08/03/2017 0936    BILITOT 0.7 08/03/2017 0936   BILITOT 0.3 07/12/2017 0940   GFRNONAA >60 08/03/2017 0936   GFRAA >60 08/03/2017 0936   Lab Results  Component Value Date   CHOL 207 (H) 07/12/2017   HDL 61 07/12/2017   LDLCALC 131 (H) 07/12/2017   TRIG 75 07/12/2017   CHOLHDL 3.4 07/12/2017   No results found for: HGBA1C Lab Results  Component Value Date   VITAMINB12 450 08/10/2016   Lab Results  Component Value Date   TSH 1.190 08/10/2016      ASSESSMENT AND PLAN 76 y.o. year old male  has a past medical history of Chronic cough, Deviated nasal septum, Enlarged prostate with lower urinary tract symptoms (LUTS), Family history of prostate cancer, GERD (gastroesophageal reflux disease), History of adenomatous polyp of colon, History of exercise stress test, Hot flashes, Hyperlipidemia, Hypertension, Mild memory disturbance, Nocturia more than twice per night, Nocturnal leg cramps (03/23/2017), OA (osteoarthritis), OSA (obstructive sleep apnea), and Prostate cancer [facility]) (urologist-  dr ottelin/  oncologist-  dr Tammi Klippel). here with:  1. Memory disturbance  The patient's memory score has remained stable.  He will continue on Aricept 10 mg at bedtime.  I did advise that if his symptoms worsen or he develops new symptoms he should let us know.  He will follow-up in 6 months or sooner if needed.  I spent 15 minutes with the patient. 50% of this time was spent reviewing his memory score.    Ward Givens, MSN, NP-C 09/24/2017, 8:50 AM [facility] [address] [place], [place] [address] [phone]

## 2017-09-24 NOTE — Progress Notes (Signed)
I have read the note, and I agree with the clinical assessment and plan.  Charles K Willis   

## 2017-09-24 NOTE — Patient Instructions (Signed)
Your Plan:  Continue Aricept Memory score is stable If your symptoms worsen or you develop new symptoms please let us know.    Thank you for coming to see us at [facility]. I hope we have been able to provide you high quality care today.  You may receive a patient satisfaction survey over the next few weeks. We would appreciate your feedback and comments so that we may continue to improve ourselves and the health of our patients.  

## 2017-09-26 ENCOUNTER — Encounter: Payer: Self-pay | Admitting: Radiation Oncology

## 2017-09-26 DIAGNOSIS — C61 Malignant neoplasm of prostate: Secondary | ICD-10-CM | POA: Diagnosis not present

## 2017-10-01 ENCOUNTER — Encounter: Payer: Self-pay | Admitting: Radiation Oncology

## 2017-10-19 ENCOUNTER — Other Ambulatory Visit: Payer: Self-pay | Admitting: Family Medicine

## 2017-10-21 NOTE — Progress Notes (Signed)
  Radiation Oncology         ([phone]) [phone] ________________________________  Name: Ricky [name] MRN: [id number]  Date: 10/01/2017  DOB: 03/03/1941  3D Planning Note   Prostate Brachytherapy Post-Implant Dosimetry  Diagnosis: 77 y.o. gentleman with stage T1c adenocarcinoma of the prostate with a Gleason's score of 3+4 and a PSA of 9.02  Narrative: On a previous date, Ricky [name] returned following prostate seed implantation for post implant planning. He underwent CT scan complex simulation to delineate the three-dimensional structures of the pelvis and demonstrate the radiation distribution.  Since that time, the seed localization, and complex isodose planning with dose volume histograms have now been completed.  Results:   Prostate Coverage - The dose of radiation delivered to the 90% or more of the prostate gland (D90) was 100.28% of the prescription dose. This exceeds our goal of greater than 90%. Rectal Sparing - The volume of rectal tissue receiving the prescription dose or higher was 0.0 cc. This falls under our thresholds tolerance of 1.0 cc.  Impression: The prostate seed implant appears to show adequate target coverage and appropriate rectal sparing.  Plan:  The patient will continue to follow with urology for ongoing PSA determinations. I would anticipate a high likelihood for local tumor control with minimal risk for rectal morbidity.  ________________________________  Sheral Apley Tammi Klippel, M.D.

## 2017-11-12 ENCOUNTER — Other Ambulatory Visit: Payer: Self-pay | Admitting: Family Medicine

## 2017-11-28 ENCOUNTER — Other Ambulatory Visit: Payer: Self-pay | Admitting: Family Medicine

## 2017-11-30 DIAGNOSIS — L821 Other seborrheic keratosis: Secondary | ICD-10-CM | POA: Diagnosis not present

## 2017-11-30 DIAGNOSIS — R202 Paresthesia of skin: Secondary | ICD-10-CM | POA: Diagnosis not present

## 2017-11-30 DIAGNOSIS — L858 Other specified epidermal thickening: Secondary | ICD-10-CM | POA: Diagnosis not present

## 2017-11-30 DIAGNOSIS — D225 Melanocytic nevi of trunk: Secondary | ICD-10-CM | POA: Diagnosis not present

## 2017-12-06 DIAGNOSIS — R351 Nocturia: Secondary | ICD-10-CM | POA: Diagnosis not present

## 2017-12-06 DIAGNOSIS — Z8546 Personal history of malignant neoplasm of prostate: Secondary | ICD-10-CM | POA: Diagnosis not present

## 2017-12-28 ENCOUNTER — Other Ambulatory Visit: Payer: Self-pay | Admitting: Family Medicine

## 2018-01-12 ENCOUNTER — Ambulatory Visit (INDEPENDENT_AMBULATORY_CARE_PROVIDER_SITE_OTHER): Payer: Medicare Other | Admitting: Physician Assistant

## 2018-01-12 ENCOUNTER — Encounter: Payer: Self-pay | Admitting: Physician Assistant

## 2018-01-12 VITALS — BP 140/77 | HR 61 | Temp 97.1°F | Ht 67.0 in | Wt 213.0 lb

## 2018-01-12 DIAGNOSIS — J40 Bronchitis, not specified as acute or chronic: Secondary | ICD-10-CM

## 2018-01-12 MED ORDER — PREDNISONE 10 MG (21) PO TBPK: ORAL_TABLET | ORAL | 0 refills | Status: DC

## 2018-01-12 MED ORDER — BENZONATATE 200 MG PO CAPS: 200.0000 mg | ORAL_CAPSULE | Freq: Two times a day (BID) | ORAL | 0 refills | Status: DC | PRN

## 2018-01-12 MED ORDER — DOXYCYCLINE HYCLATE 100 MG PO TABS: 100.0000 mg | ORAL_TABLET | Freq: Two times a day (BID) | ORAL | 0 refills | Status: DC

## 2018-01-12 NOTE — Patient Instructions (Signed)

## 2018-01-13 NOTE — Progress Notes (Signed)
BP 140/77   Pulse 61   Temp (!) 97.1 F (36.2 C) (Oral)   Ht 5\' 7"  (1.702 m)   Wt 213 lb (96.6 kg)   BMI 33.36 kg/m    Subjective:    Patient ID: Ricky [name], male    DOB: 04-17-1941, 77 y.o.   MRN: [id number]  HPI: Ricky [name] is a 77 y.o. male presenting on 01/12/2018 for Cough (congestion )  Patient with several days of progressing upper respiratory and bronchial symptoms. Initially there was more upper respiratory congestion. This progressed to having significant cough that is productive throughout the day and severe at night. There is occasional wheezing after coughing. Sometimes there is slight dyspnea on exertion. It is productive mucus that is yellow in color. Denies any blood.   Past Medical History:  Diagnosis Date  . Chronic cough   . Deviated nasal septum   . 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 Family history of prostate cancer   . GERD (gastroesophageal reflux disease)   . History of adenomatous polyp of colon    2008; 2011  . History of exercise stress test    01-14-2009 by dr hochrein-- normal w/ no ischemia  . Hot flashes    DUE TO HORMONE THERAPY FOR PROSTATE CANCER  . Hyperlipidemia   . Hypertension   . Mild memory disturbance    per dr Jannifer Franklin note (neurologist)  . Nocturia more than twice per night   . Nocturnal leg cramps 03/23/2017  . OA (osteoarthritis)   . OSA (obstructive sleep apnea)    moderate osa per study 02-12-2017  cpap recommended--- per pt never heard back from doctor for obtaining cpap  . Prostate cancer [place]) urologist-  dr ottelin/  oncologist-  dr Tammi Klippel   dx 10-10-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, Gleason 3+4, PSA 9.02, vol 78cc started ADT to decrease size;  08/ 2018 prostate decrease size vol 46cc and PSA 0.36--- scheduled for radiative prostate seed implants   Relevant past medical, surgical, family and social history reviewed and updated as indicated. Interim medical history since our last  visit reviewed. Allergies and medications reviewed and updated. DATA REVIEWED: CHART IN EPIC  Family History reviewed for pertinent findings.  Review of Systems  Constitutional: Positive for fatigue. Negative for appetite change.  HENT: Positive for congestion, sinus pressure and sore throat.   Eyes: Negative.  Negative for pain and visual disturbance.  Respiratory: Positive for cough and wheezing. Negative for chest tightness and shortness of breath.   Cardiovascular: Negative.  Negative for chest pain, palpitations and leg swelling.  Gastrointestinal: Negative.  Negative for abdominal pain, diarrhea, nausea and vomiting.  Endocrine: Negative.   Genitourinary: Negative.   Musculoskeletal: Positive for back pain and myalgias.  Skin: Negative.  Negative for color change and rash.  Neurological: Positive for headaches. Negative for weakness and numbness.  Psychiatric/Behavioral: Negative.     Allergies as of 01/12/2018   No Known Allergies     Medication List        Accurate as of 01/12/18 11:33 AM. Always use your most recent med list.          amLODipine 10 MG tablet Commonly known as:  NORVASC TAKE 1 TABLET BY MOUTH DAILY   benzonatate 200 MG capsule Commonly known as:  TESSALON Take 1 capsule (200 mg total) by mouth 2 (two) times daily as needed for cough.   donepezil 10 MG tablet Commonly known  as:  ARICEPT TAKE 1 TABLET(10 MG) BY MOUTH AT BEDTIME   doxazosin 2 MG tablet Commonly known as:  CARDURA TAKE 1 TABLET BY MOUTH DAILY   doxycycline 100 MG tablet Commonly known as:  VIBRA-TABS Take 1 tablet (100 mg total) by mouth 2 (two) times daily.   esomeprazole 20 MG capsule Commonly known as:  NEXIUM Take 20 mg by mouth every evening.   FIBER FORMULA PO Take 3 capsules by mouth 2 (two) times daily.   fluticasone 50 MCG/ACT nasal spray Commonly known as:  FLONASE One to 2 sprays each nostril at bedtime and stay on this medication for that at least the next 4-6  weeks   HYDROcodone-acetaminophen 10-325 MG tablet Commonly known as:  NORCO Take 1-2 tablets by mouth every 4 (four) hours as needed for moderate pain. Maximum dose per 24 hours - 8 pills   losartan 100 MG tablet Commonly known as:  COZAAR TAKE 1 TABLET BY MOUTH DAILY   megestrol 20 MG tablet Commonly known as:  MEGACE TK 1 T PO BID   omeprazole 40 MG capsule Commonly known as:  PRILOSEC Take 40 mg by mouth.   potassium chloride SA 20 MEQ tablet Commonly known as:  K-DUR,KLOR-CON TAKE 1 TABLET BY MOUTH THREE TIMES DAILY   predniSONE 10 MG (21) Tbpk tablet Commonly known as:  STERAPRED UNI-PAK 21 TAB As directed x 6 days   tamsulosin 0.4 MG Caps capsule Commonly known as:  FLOMAX TAKE ONE CAPSULE BY MOUTH TWICE DAILY   tolterodine 4 MG 24 hr capsule Commonly known as:  DETROL LA TK ONE C PO QHS   VITAMIN D PO Take 1 capsule by mouth daily.          Objective:    BP 140/77   Pulse 61   Temp (!) 97.1 F (36.2 C) (Oral)   Ht 5\' 7"  (1.702 m)   Wt 213 lb (96.6 kg)   BMI 33.36 kg/m   No Known Allergies  Wt Readings from Last 3 Encounters:  01/12/18 213 lb (96.6 kg)  09/24/17 213 lb 3.2 oz (96.7 kg)  09/05/17 207 lb 3.2 oz (94 kg)    Physical Exam  Constitutional: He appears well-developed and well-nourished.  HENT:  Head: Normocephalic and atraumatic.  Right Ear: Hearing and tympanic membrane normal.  Left Ear: Hearing and tympanic membrane normal.  Nose: Mucosal edema and sinus tenderness present. No nasal deformity. Right sinus exhibits frontal sinus tenderness. Left sinus exhibits frontal sinus tenderness.  Mouth/Throat: Posterior oropharyngeal erythema present.  Eyes: Pupils are equal, round, and reactive to light. Conjunctivae and EOM are normal. Right eye exhibits no discharge. Left eye exhibits no discharge.  Neck: Normal range of motion. Neck supple.  Cardiovascular: Normal rate, regular rhythm and normal heart sounds.  Pulmonary/Chest: Effort  normal. No respiratory distress. He has no decreased breath sounds. He has wheezes. He has no rhonchi. He has no rales.  Abdominal: Soft. Bowel sounds are normal.  Musculoskeletal: Normal range of motion.  Skin: Skin is warm and dry.        Assessment & Plan:   1. Bronchitis - doxycycline (VIBRA-TABS) 100 MG tablet; Take 1 tablet (100 mg total) by mouth 2 (two) times daily.  Dispense: 20 tablet; Refill: 0 - predniSONE (STERAPRED UNI-PAK 21 TAB) 10 MG (21) TBPK tablet; As directed x 6 days  Dispense: 21 tablet; Refill: 0 - benzonatate (TESSALON) 200 MG capsule; Take 1 capsule (200 mg total) by mouth 2 (two) times  daily as needed for cough.  Dispense: 20 capsule; Refill: 0   Continue all other maintenance medications as listed above.  Follow up plan: No follow-ups on file.  Educational handout given for [place] PA-C [place] [address]  [place], [place] [address] [phone]   01/12/2018, 11:33 AM

## 2018-01-22 ENCOUNTER — Other Ambulatory Visit: Payer: Self-pay | Admitting: Family Medicine

## 2018-02-06 DIAGNOSIS — J189 Pneumonia, unspecified organism: Secondary | ICD-10-CM

## 2018-02-06 HISTORY — DX: Pneumonia, unspecified organism: J18.9

## 2018-02-14 ENCOUNTER — Other Ambulatory Visit: Payer: Self-pay | Admitting: Physician Assistant

## 2018-02-14 DIAGNOSIS — J40 Bronchitis, not specified as acute or chronic: Secondary | ICD-10-CM

## 2018-02-17 DIAGNOSIS — R05 Cough: Secondary | ICD-10-CM | POA: Diagnosis not present

## 2018-02-17 DIAGNOSIS — J189 Pneumonia, unspecified organism: Secondary | ICD-10-CM | POA: Diagnosis not present

## 2018-02-17 DIAGNOSIS — R0981 Nasal congestion: Secondary | ICD-10-CM | POA: Diagnosis not present

## 2018-02-17 DIAGNOSIS — R0602 Shortness of breath: Secondary | ICD-10-CM | POA: Diagnosis not present

## 2018-02-21 ENCOUNTER — Telehealth: Payer: Self-pay | Admitting: Family Medicine

## 2018-02-21 NOTE — Telephone Encounter (Signed)
Pt is in [place] with his son and son states he was supposed to fly back this Sunday but was seen at Urgent Care 4 days ago and dx'd with pneumonia and given antibiotics, steroid shot, inhaler and cough syrup but son is concerned about him flying. Advised the cough can last a while but if he feels he isn't getting better he can take him to Urgent Care in [facility] or have someone fly back with him and then follow up with [place]. Pt's son voiced understanding.

## 2018-02-22 DIAGNOSIS — J189 Pneumonia, unspecified organism: Secondary | ICD-10-CM | POA: Diagnosis not present

## 2018-02-22 DIAGNOSIS — R5381 Other malaise: Secondary | ICD-10-CM | POA: Diagnosis not present

## 2018-02-22 DIAGNOSIS — R0602 Shortness of breath: Secondary | ICD-10-CM | POA: Diagnosis not present

## 2018-02-24 ENCOUNTER — Other Ambulatory Visit: Payer: Self-pay | Admitting: Family Medicine

## 2018-02-26 ENCOUNTER — Encounter: Payer: Self-pay | Admitting: Physician Assistant

## 2018-02-26 ENCOUNTER — Ambulatory Visit: Payer: Medicare Other | Admitting: Physician Assistant

## 2018-02-26 VITALS — BP 127/69 | HR 68 | Temp 97.3°F | Ht 67.0 in | Wt 208.4 lb

## 2018-02-26 DIAGNOSIS — J189 Pneumonia, unspecified organism: Secondary | ICD-10-CM | POA: Diagnosis not present

## 2018-02-26 MED ORDER — FLUTICASONE-SALMETEROL 100-50 MCG/DOSE IN AEPB: 1.0000 | INHALATION_SPRAY | Freq: Two times a day (BID) | RESPIRATORY_TRACT | 0 refills | Status: DC

## 2018-02-26 NOTE — Patient Instructions (Signed)
Aspiration Pneumonia °Aspiration pneumonia is an infection in your lungs. It occurs when food, liquid, or stomach contents (vomit) are inhaled (aspirated) into your lungs. When these things get into your lungs, swelling (inflammation) and infection can occur. This can make it difficult for you to breathe. Aspiration pneumonia is a serious condition and can be life threatening. °What increases the risk? °Aspiration pneumonia is more likely to occur when a person's cough (gag) reflex or ability to swallow has been decreased. Some things that can do this include: °· Having a brain injury or disease, such as stroke, seizures, Parkinson's disease, dementia, or amyotrophic lateral sclerosis (ALS). °· Being given general anesthetic for procedures. °· Being in a coma (unconscious). °· Having a narrowing of the tube that carries food to the stomach (esophagus). °· Drinking too much alcohol. If a person passes out and vomits, vomit can be swallowed into the lungs. °· Taking certain medicines, such as tranquilizers or sedatives. ° °What are the signs or symptoms? °· Coughing after swallowing food or liquids. °· Breathing problems, such as wheezing or shortness of breath. °· Bluish skin. This can be caused by lack of oxygen. °· Coughing up food or mucus. The mucus might contain blood, greenish material, or yellowish-white fluid (pus). °· Fever. °· Chest pain. °· Being more tired than usual (fatigue). °· Sweating more than usual. °· Bad breath. °How is this diagnosed? °A physical exam will be done. During the exam, the health care provider will listen to your lungs with a stethoscope to check for: °· Crackling sounds in the lungs. °· Decreased breath sounds. °· A rapid heartbeat. ° °Various tests may be ordered. These may include: °· Chest X-ray. °· CT scan. °· Swallowing study. This test looks at how food is swallowed and whether it goes into your breathing tube (trachea) or food pipe (esophagus). °· Sputum culture. Saliva and  mucus (sputum) are collected from the lungs or the tubes that carry air to the lungs (bronchi). The sputum is then tested for bacteria. °· Bronchoscopy. This test uses a flexible tube (bronchoscope) to see inside the lungs. ° °How is this treated? °Treatment will usually include antibiotic medicines. Other medicines may also be used to reduce fever or pain. You may need to be treated in the hospital. In the hospital, your breathing will be carefully monitored. Depending on how well you are breathing, you may need to be given oxygen, or you may need breathing support from a breathing machine (ventilator). For people who fail a swallowing study, a feeding tube might be placed in the stomach, or they may be asked to avoid certain food textures or liquids when they eat. °Follow these instructions at home: °· Carefully follow any special eating instructions you were given, such as avoiding certain food textures or thickening liquids. This reduces the risk of developing aspiration pneumonia again. °· Only take over-the-counter or prescription medicines as directed by your health care provider. Follow the directions carefully. °· If you were prescribed antibiotics, take them as directed. Finish them even if you start to feel better. °· Rest as instructed by your health care provider. °· Keep all follow-up appointments with your health care provider. °Contact a health care provider if: °· You develop worsening shortness of breath, wheezing, or difficulty breathing. °· You develop a fever. °· You have chest pain. °This information is not intended to replace advice given to you by your health care provider. Make sure you discuss any questions you have with your health care   provider. °Document Released: 07/23/2009 Document Revised: 03/08/2016 Document Reviewed: 03/13/2013 °Elsevier Interactive Patient Education © 2017 [employer] ° °

## 2018-02-26 NOTE — Progress Notes (Signed)
  Subjective:     Patient ID: Ricky [name], male   DOB: 26-Aug-1941, 77 y.o.   MRN: [id number]  HPI Pt traveled to [place] to see a soccer game Prior to leaving had some cough Sx got worse while there so he went to Urgent Care Dx with pneumonia and started on Doxycyline and Pred Still taking Doxcycline Still currently using Alb neb at home  Review of Systems  Constitutional: Negative.   HENT: Positive for congestion. Negative for nosebleeds, postnasal drip, rhinorrhea, sinus pressure and sinus pain.   Respiratory: Positive for cough. Negative for chest tightness, shortness of breath and wheezing.   Cardiovascular: Negative.        Objective:   Physical Exam  Constitutional: He appears well-developed and well-nourished.  HENT:  Mouth/Throat: Oropharynx is clear and moist. No oropharyngeal exudate.  Neck: Neck supple.  Cardiovascular: Normal rate, regular rhythm and normal heart sounds.  Pulmonary/Chest: Effort normal and breath sounds normal. No respiratory distress. He has no wheezes.  Lymphadenopathy:    He has no cervical adenopathy.  Vitals reviewed.      Assessment:     1. Pneumonia, unspecified organism        Plan:     Will add Advair to see if it helps with some of his continued sx Finish course of Doxycycline Continue Alb neb prn F/U appt in ~ 1 month for f/u CXR F/U sooner if any problems

## 2018-03-07 ENCOUNTER — Other Ambulatory Visit: Payer: Self-pay | Admitting: Family Medicine

## 2018-03-14 ENCOUNTER — Telehealth: Payer: Self-pay | Admitting: Family Medicine

## 2018-03-14 ENCOUNTER — Encounter: Payer: Self-pay | Admitting: Physician Assistant

## 2018-03-14 ENCOUNTER — Ambulatory Visit: Payer: Medicare Other | Admitting: Physician Assistant

## 2018-03-14 VITALS — BP 124/79 | HR 70 | Temp 98.2°F | Ht 67.0 in | Wt 210.1 lb

## 2018-03-14 DIAGNOSIS — H6123 Impacted cerumen, bilateral: Secondary | ICD-10-CM | POA: Diagnosis not present

## 2018-03-14 NOTE — Patient Instructions (Signed)
Earwax Buildup, Adult The ears produce a substance called earwax that helps keep bacteria out of the ear and protects the skin in the ear canal. Occasionally, earwax can build up in the ear and cause discomfort or hearing loss. What increases the risk? This condition is more likely to develop in people who:  Are male.  Are elderly.  Naturally produce more earwax.  Clean their ears often with cotton swabs.  Use earplugs often.  Use in-ear headphones often.  Wear hearing aids.  Have narrow ear canals.  Have earwax that is overly thick or sticky.  Have eczema.  Are dehydrated.  Have excess hair in the ear canal.  What are the signs or symptoms? Symptoms of this condition include:  Reduced or muffled hearing.  A feeling of fullness in the ear or feeling that the ear is plugged.  Fluid coming from the ear.  Ear pain.  Ear itch.  Ringing in the ear.  Coughing.  An obvious piece of earwax that can be seen inside the ear canal.  How is this diagnosed? This condition may be diagnosed based on:  Your symptoms.  Your medical history.  An ear exam. During the exam, your health care provider will look into your ear with an instrument called an otoscope.  You may have tests, including a hearing test. How is this treated? This condition may be treated by:  Using ear drops to soften the earwax.  Having the earwax removed by a health care provider. The health care provider may: ? Flush the ear with water. ? Use an instrument that has a loop on the end (curette). ? Use a suction device.  Surgery to remove the wax buildup. This may be done in severe cases.  Follow these instructions at home:  Take over-the-counter and prescription medicines only as told by your health care provider.  Do not put any objects, including cotton swabs, into your ear. You can clean the opening of your ear canal with a washcloth or facial tissue.  Follow instructions from your health  care provider about cleaning your ears. Do not over-clean your ears.  Drink enough fluid to keep your urine clear or pale yellow. This will help to thin the earwax.  Keep all follow-up visits as told by your health care provider. If earwax builds up in your ears often or if you use hearing aids, consider seeing your health care provider for routine, preventive ear cleanings. Ask your health care provider how often you should schedule your cleanings.  If you have hearing aids, clean them according to instructions from the manufacturer and your health care provider. Contact a health care provider if:  You have ear pain.  You develop a fever.  You have blood, pus, or other fluid coming from your ear.  You have hearing loss.  You have ringing in your ears that does not go away.  Your symptoms do not improve with treatment.  You feel like the room is spinning (vertigo). Summary  Earwax can build up in the ear and cause discomfort or hearing loss.  The most common symptoms of this condition include reduced or muffled hearing and a feeling of fullness in the ear or feeling that the ear is plugged.  This condition may be diagnosed based on your symptoms, your medical history, and an ear exam.  This condition may be treated by using ear drops to soften the earwax or by having the earwax removed by a health care provider.  Do   not put any objects, including cotton swabs, into your ear. You can clean the opening of your ear canal with a washcloth or facial tissue. This information is not intended to replace advice given to you by your health care provider. Make sure you discuss any questions you have with your health care provider. Document Released: 11/02/2004 Document Revised: 12/06/2016 Document Reviewed: 12/06/2016 Elsevier Interactive Patient Education  2018 Elsevier Inc.  

## 2018-03-14 NOTE — Progress Notes (Signed)
  Subjective:     Patient ID: Ricky [name], male   DOB: 01/06/1941, 77 y.o.   MRN: [id number]  HPI Pt with decrease hearing to both ears He went to have ears tested for hearing aides and informed ears needed to be cleaned out  Review of Systems  Constitutional: Negative.   HENT: Positive for hearing loss. Negative for congestion, ear discharge, ear pain, postnasal drip, sinus pressure, sinus pain, sneezing, sore throat, tinnitus and voice change.        Objective:   Physical Exam  Constitutional: He appears well-developed and well-nourished. No distress.  HENT:  Right Ear: External ear normal.  Left Ear: External ear normal.  R canal impacted with cerumen L canal with sl cerumen Both canals irrigated Following irrigation canals and TM's nl bilat  Skin: He is not diaphoretic.  Nursing note and vitals reviewed.      Assessment:     1. Impacted cerumen of both ears        Plan:     No Qtip use OTC wax softener monthly Encouraged pt to keep f/u appt regarding pneumonia

## 2018-03-20 DIAGNOSIS — L304 Erythema intertrigo: Secondary | ICD-10-CM | POA: Diagnosis not present

## 2018-03-20 DIAGNOSIS — L738 Other specified follicular disorders: Secondary | ICD-10-CM | POA: Diagnosis not present

## 2018-03-20 DIAGNOSIS — L578 Other skin changes due to chronic exposure to nonionizing radiation: Secondary | ICD-10-CM | POA: Diagnosis not present

## 2018-03-25 ENCOUNTER — Other Ambulatory Visit: Payer: Self-pay | Admitting: Neurology

## 2018-03-25 ENCOUNTER — Telehealth: Payer: Self-pay | Admitting: Adult Health

## 2018-03-25 ENCOUNTER — Ambulatory Visit (INDEPENDENT_AMBULATORY_CARE_PROVIDER_SITE_OTHER): Payer: Medicare Other | Admitting: Adult Health

## 2018-03-25 ENCOUNTER — Encounter: Payer: Self-pay | Admitting: Adult Health

## 2018-03-25 VITALS — BP 123/75 | HR 66 | Ht 67.0 in | Wt 213.8 lb

## 2018-03-25 DIAGNOSIS — R413 Other amnesia: Secondary | ICD-10-CM | POA: Diagnosis not present

## 2018-03-25 DIAGNOSIS — Z5181 Encounter for therapeutic drug level monitoring: Secondary | ICD-10-CM | POA: Diagnosis not present

## 2018-03-25 MED ORDER — MEMANTINE HCL 28 X 5 MG & 21 X 10 MG PO TABS: ORAL_TABLET | ORAL | 0 refills | Status: DC

## 2018-03-25 MED ORDER — MEMANTINE HCL 5 MG PO TABS: ORAL_TABLET | ORAL | 0 refills | Status: DC

## 2018-03-25 NOTE — Telephone Encounter (Signed)
LMVM for pt and wife about the memantine prescription and tablets vs pack.  If questions about instructions to please call us back.  Fax confirmation received [facility] [phone].

## 2018-03-25 NOTE — Progress Notes (Signed)
PATIENT: Ricky [name] DOB: 01-10-1941  REASON FOR VISIT: follow up HISTORY FROM: patient  HISTORY OF PRESENT ILLNESS: Today 03/25/18:  Ricky [name] is a 77 year old male with a history of memory disturbance.  He returns today for follow-up.  He feels that his memory may be slightly worse.  He continues to live at home with his wife.  He is able to complete all ADLs independently.  His son manages his finances for him.  He does not prepare any meals.  He states that he can do simple things such as make a peanut butter and jelly sandwich.  He denies any trouble sleeping.  He reports that he takes frequent naps throughout the day.  He reports that he manages his own medications.  He reports trouble managing his appointment time.  Denies any changes in his mood or behavior.  He returns today for evaluation.  HISTORY 09/24/17  Ricky [name] is a 77 year old male with a history of memory disturbance.  He returns today for follow-up.  He remains on Aricept 10 mg at bedtime.  He feels that his memory has remained stable.  He lives at home with his wife.  He is able to complete all ADLs independently.  He operates a Teacher, music without difficulty.  He is still he reports that he does not sleep well because he has to get up and go to the bathroom frequently.  Receiving treatment for prostate cancer.  He reports that he was on hormone therapy for several months and is now doing the radiation seeds.  He reports that he continues to have episodes of fatigue and hot flashes.  He is on Megace.  He reports that these episodes are infrequent.  He returns today for an evaluation.    REVIEW OF SYSTEMS: Out of a complete 14 system review of symptoms, the patient complains only of the following symptoms, and all other reviewed systems are negative.  See HPI  ALLERGIES: No Known Allergies  HOME MEDICATIONS: Outpatient Medications Prior to Visit  Medication Sig Dispense Refill  . amLODipine (NORVASC) 10  MG tablet TAKE 1 TABLET BY MOUTH DAILY (Patient taking differently: TAKE 1 TABLET BY MOUTH DAILY--- takes in pm) 90 tablet 0  . Cholecalciferol (VITAMIN D PO) Take 1 capsule by mouth daily.     Marland Kitchen donepezil (ARICEPT) 10 MG tablet TAKE 1 TABLET(10 MG) BY MOUTH AT BEDTIME 90 tablet 0  . doxazosin (CARDURA) 2 MG tablet TAKE 1 TABLET BY MOUTH DAILY (Patient taking differently: TAKE 1 TABLET BY MOUTH DAILY--- takes qhs) 90 tablet 0  . FIBER FORMULA PO Take 3 capsules by mouth 2 (two) times daily.    Marland Kitchen losartan (COZAAR) 100 MG tablet TAKE 1 TABLET BY MOUTH DAILY (Patient taking differently: TAKE 1 TABLET BY MOUTH DAILY---takes  in pm) 90 tablet 0  . potassium chloride SA (K-DUR,KLOR-CON) 20 MEQ tablet TAKE 1 TABLET BY MOUTH THREE TIMES DAILY 270 tablet 0  . tamsulosin (FLOMAX) 0.4 MG CAPS capsule TAKE ONE CAPSULE BY MOUTH TWICE DAILY 180 capsule 0  . tamsulosin (FLOMAX) 0.4 MG CAPS capsule TAKE 1 CAPSULE BY MOUTH TWICE DAILY 180 capsule 0  . tolterodine (DETROL LA) 4 MG 24 hr capsule TK ONE C PO QHS  10  . fluticasone (FLONASE) 50 MCG/ACT nasal spray One to 2 sprays each nostril at bedtime and stay on this medication for that at least the next 4-6 weeks (Patient taking differently: One to 2 sprays each nostril at bedtime  prn) 16 g 6  . esomeprazole (NEXIUM) 20 MG capsule Take 20 mg by mouth every evening.     . Fluticasone-Salmeterol (ADVAIR) 100-50 MCG/DOSE AEPB Inhale 1 puff into the lungs 2 (two) times daily. Rinse mouth after use 1 each 0  . HYDROcodone-acetaminophen (NORCO) 10-325 MG tablet Take 1-2 tablets by mouth every 4 (four) hours as needed for moderate pain. Maximum dose per 24 hours - 8 pills 8 tablet 0  . megestrol (MEGACE) 20 MG tablet TK 1 T PO BID  5  . omeprazole (PRILOSEC) 40 MG capsule Take 40 mg by mouth.     No facility-administered medications prior to visit.     PAST MEDICAL HISTORY: Past Medical History:  Diagnosis Date  . Chronic cough   . Deviated nasal septum   .  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 Family history of prostate cancer   . GERD (gastroesophageal reflux disease)   . History of adenomatous polyp of colon    2008; 2011  . History of exercise stress test    01-14-2009 by dr hochrein-- normal w/ no ischemia  . Hot flashes    DUE TO HORMONE THERAPY FOR PROSTATE CANCER  . Hyperlipidemia   . Hypertension   . Mild memory disturbance    per dr Jannifer Franklin note (neurologist)  . Nocturia more than twice per night   . Nocturnal leg cramps 03/23/2017  . OA (osteoarthritis)   . OSA (obstructive sleep apnea)    moderate osa per study 02-12-2017  cpap recommended--- per pt never heard back from doctor for obtaining cpap  . Pneumonia 02/2018  . Prostate cancer [facility]) urologist-  dr ottelin/  oncologist-  dr Tammi Klippel   dx 10-10-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, Gleason 3+4, PSA 9.02, vol 78cc started ADT to decrease size;  08/ 2018 prostate decrease size vol 46cc and PSA 0.36--- scheduled for radiative prostate seed implants    PAST SURGICAL HISTORY: Past Surgical History:  Procedure Laterality Date  . CATARACT EXTRACTION W/ INTRAOCULAR LENS  IMPLANT, BILATERAL  2016  . COLONOSCOPY  last one 08-08-2013  . CYSTOSCOPY N/A 08/10/2017   Procedure: CYSTOSCOPY FLEXIBLE;  Surgeon: Kathie Rhodes, MD;  Location: [facility];  Service: Urology;  Laterality: N/A;  NO SEEDS FOUND IN BLADDER  . HAND SURGERY Left 1992  . KNEE ARTHROSCOPY Bilateral 1960's and 70's  . PROSTATE BIOPSY  10-10-2016   dr Karsten Ro office  . RADIOACTIVE SEED IMPLANT N/A 08/10/2017   Procedure: RADIOACTIVE SEED IMPLANT/BRACHYTHERAPY IMPLANT;  Surgeon: Kathie Rhodes, MD;  Location: [facility];  Service: Urology;  Laterality: N/A;    64   SEEDS IMPLANTED  . ROTATOR CUFF REPAIR Right 2004  . SPACE OAR INSTILLATION N/A 08/10/2017   Procedure: SPACE OAR INSTILLATION;  Surgeon: Kathie Rhodes, MD;  Location: [facility];  Service: Urology;   Laterality: N/A;  . TOE SURGERY Left 1970   little toe  . TOTAL KNEE ARTHROPLASTY Bilateral 01-25-2009   dr Wynelle Link [facility]    FAMILY HISTORY: Family History  Problem Relation Age of Onset  . Healthy Mother   . Prostate cancer Father        Dx 33s; Deceased 103  . Prostate cancer Brother 4       currently 34  . Alcohol abuse Brother   . Bipolar disorder Brother   . Prostate cancer Paternal Uncle        unsure of age; possibly 2nd uncle also has  prostate ca  . Prostate cancer Maternal Uncle        unsure of age  . Breast cancer Maternal Aunt        unsure of age  . Colon cancer Neg Hx   . Esophageal cancer Neg Hx   . Rectal cancer Neg Hx   . Stomach cancer Neg Hx     SOCIAL HISTORY: Social History   Socioeconomic History  . Marital status: Married    Spouse name: [name]  . Number of children: 3  . Years of education: BA  . Highest education level: Not on file  Occupational History  . Occupation: Retired  Scientific laboratory technician  . Financial resource strain: Not on file  . Food insecurity:    Worry: Not on file    Inability: Not on file  . Transportation needs:    Medical: Not on file    Non-medical: Not on file  Tobacco Use  . Smoking status: Never Smoker  . Smokeless tobacco: Never Used  Substance and Sexual Activity  . Alcohol use: Yes    Comment: rare  . Drug use: No  . Sexual activity: Yes    Partners: Female    Comment: vasectomy  Lifestyle  . Physical activity:    Days per week: Not on file    Minutes per session: Not on file  . Stress: Not on file  Relationships  . Social connections:    Talks on phone: Not on file    Gets together: Not on file    Attends religious service: Not on file    Active member of club or organization: Not on file    Attends meetings of clubs or organizations: Not on file    Relationship status: Not on file  . Intimate partner violence:    Fear of current or ex partner: Not on file    Emotionally abused: Not on file    Physically  abused: Not on file    Forced sexual activity: Not on file  Other Topics Concern  . Not on file  Social History Narrative   Lives at home w/ his wife   Right-handed   Caffeine: none      PHYSICAL EXAM  Vitals:   03/25/18 0854  BP: 123/75  Pulse: 66  Weight: 213 lb 12.8 oz (97 kg)  Height: 5\' 7"  (1.702 m)   Body mass index is 33.49 kg/m.   MMSE - Mini Mental State Exam 03/25/2018 03/23/2017 12/20/2016  Not completed: - - -  Orientation to time 4 3 4   Orientation to Place 5 5 5   Registration 3 3 3   Attention/ Calculation 4 5 5   Recall 3 3 3   Language- name 2 objects 2 2 2   Language- repeat 1 1 1   Language- follow 3 step command 2 3 3   Language- read & follow direction 1 1 1   Write a sentence 0 1 1  Copy design 1 1 1   Total score 26 28 29     Generalized: Well developed, in no acute distress   Neurological examination  Mentation: Alert oriented to time, place, history taking. Follows all commands speech and language fluent Cranial nerve II-XII: Pupils were equal round reactive to light. Extraocular movements were full, visual field were full on confrontational test. Facial sensation and strength were normal. Uvula tongue midline. Head turning and shoulder shrug  were normal and symmetric. Motor: The motor testing reveals 5 over 5 strength of all 4 extremities. Good symmetric motor tone is  noted throughout.  Sensory: Sensory testing is intact to soft touch on all 4 extremities. No evidence of extinction is noted.  Coordination: Cerebellar testing reveals good finger-nose-finger and heel-to-shin bilaterally.  Gait and station: Gait is normal. Tandem gait is slightly unsteady . Romberg is negative. No drift is seen.  Reflexes: Deep tendon reflexes are symmetric and normal bilaterally.   DIAGNOSTIC DATA (LABS, IMAGING, TESTING) - I reviewed patient records, labs, notes, testing and imaging myself where available.  Lab Results  Component Value Date   WBC 4.9 08/03/2017    HGB 12.6 (L) 08/03/2017   HCT 36.5 (L) 08/03/2017   MCV 83.5 08/03/2017   PLT 193 08/03/2017      Component Value Date/Time   NA 141 08/03/2017 0936   NA 147 (H) 07/12/2017 0940   K 3.8 08/03/2017 0936   CL 107 08/03/2017 0936   CO2 25 08/03/2017 0936   GLUCOSE 88 08/03/2017 0936   BUN 19 08/03/2017 0936   BUN 26 07/12/2017 0940   CREATININE 1.13 08/03/2017 0936   CALCIUM 9.8 08/03/2017 0936   PROT 6.7 08/03/2017 0936   PROT 6.3 07/12/2017 0940   ALBUMIN 4.1 08/03/2017 0936   ALBUMIN 4.4 07/12/2017 0940   AST 20 08/03/2017 0936   ALT 27 08/03/2017 0936   ALKPHOS 74 08/03/2017 0936   BILITOT 0.7 08/03/2017 0936   BILITOT 0.3 07/12/2017 0940   GFRNONAA >60 08/03/2017 0936   GFRAA >60 08/03/2017 0936   Lab Results  Component Value Date   CHOL 207 (H) 07/12/2017   HDL 61 07/12/2017   LDLCALC 131 (H) 07/12/2017   TRIG 75 07/12/2017   CHOLHDL 3.4 07/12/2017   No results found for: HGBA1C Lab Results  Component Value Date   VITAMINB12 450 08/10/2016   Lab Results  Component Value Date   TSH 1.190 08/10/2016      ASSESSMENT AND PLAN 77 y.o. year old male  has a past medical history of Chronic cough, Deviated nasal septum, Enlarged prostate with lower urinary tract symptoms (LUTS), Family history of prostate cancer, GERD (gastroesophageal reflux disease), History of adenomatous polyp of colon, History of exercise stress test, Hot flashes, Hyperlipidemia, Hypertension, Mild memory disturbance, Nocturia more than twice per night, Nocturnal leg cramps (03/23/2017), OA (osteoarthritis), OSA (obstructive sleep apnea), Pneumonia (02/2018), and Prostate cancer [facility]) (urologist-  dr ottelin/  oncologist-  dr Tammi Klippel). here with:  1. Memory disturbance  The patient's memory score has declined slightly.  He will continue on Aricept 10 mg at bedtime.  He will be started on Namenda titration pack.  I have reviewed potential side effects of Namenda and provided him with a handout.  I  have advised that if his symptoms worsen or he develops new symptoms he should let us know.  He will follow-up in 6 months or sooner if needed.  I spent 15 minutes with the patient. 50% of this time was spent discussing his memory score   Ward Givens, MSN, NP-C 03/25/2018, 9:23 AM [facility] [address] [place], [place] [id number] [phone]

## 2018-03-25 NOTE — Telephone Encounter (Signed)
I spoke to pharmacy and they will cover generic namenda 5mg  tablets for $5.00.  Cancelled th titration pak and replaced with memantine 5mg  tabs (titration).

## 2018-03-25 NOTE — Telephone Encounter (Signed)
Pts wife called stating that their insurance will not cover the titration pack. Requesting a call back to discuss a different medication.

## 2018-03-25 NOTE — Patient Instructions (Signed)
Your Plan:  Continue Aricept  Start Namenda First month is a titration pack. When you start week 4 of pack please call so we can send in maintenance dose. If your symptoms worsen or you develop new symptoms please let us know.    Thank you for coming to see [place] at [facility]. I hope we have been able to provide you high quality care today.  You may receive a patient satisfaction survey over the next few weeks. We would appreciate your feedback and comments so that we may continue to improve ourselves and the health of our patients.  Memantine Tablets What is this medicine? MEMANTINE (MEM an teen) is used to treat dementia caused by Alzheimer's disease. This medicine may be used for other purposes; ask your health care provider or pharmacist if you have questions. COMMON BRAND NAME(S): Namenda What should I tell my health care provider before I take this medicine? They need to know if you have any of these conditions: -difficulty passing urine -kidney disease -liver disease -seizures -an unusual or allergic reaction to memantine, other medicines, foods, dyes, or preservatives -pregnant or trying to get pregnant -breast-feeding How should I use this medicine? Take this medicine by mouth with a glass of water. Follow the directions on the prescription label. You may take this medicine with or without food. Take your doses at regular intervals. Do not take your medicine more often than directed. Continue to take your medicine even if you feel better. Do not stop taking except on the advice of your doctor or health care professional. Talk to your pediatrician regarding the use of this medicine in children. Special care may be needed. Overdosage: If you think you have taken too much of this medicine contact a poison control center or emergency room at once. NOTE: This medicine is only for you. Do not share this medicine with others. What if I miss a dose? If you miss a  dose, take it as soon as you can. If it is almost time for your next dose, take only that dose. Do not take double or extra doses. If you do not take your medicine for several days, contact your health care provider. Your dose may need to be changed. What may interact with this medicine? -acetazolamide -amantadine -cimetidine -dextromethorphan -dofetilide -hydrochlorothiazide -ketamine -metformin -methazolamide -quinidine -ranitidine -sodium bicarbonate -triamterene This list may not describe all possible interactions. Give your health care provider a list of all the medicines, herbs, non-prescription drugs, or dietary supplements you use. Also tell them if you smoke, drink alcohol, or use illegal drugs. Some items may interact with your medicine. What should I watch for while using this medicine? Visit your doctor or health care professional for regular checks on your progress. Check with your doctor or health care professional if there is no improvement in your symptoms or if they get worse. You may get drowsy or dizzy. Do not drive, use machinery, or do anything that needs mental alertness until you know how this drug affects you. Do not stand or sit up quickly, especially if you are an older patient. This reduces the risk of dizzy or fainting spells. Alcohol can make you more drowsy and dizzy. Avoid alcoholic drinks. What side effects may I notice from receiving this medicine? Side effects that you should report to your doctor or health care professional as soon as possible: -allergic reactions like skin rash, itching or hives, swelling of the face, lips, or tongue -agitation or a feeling  of restlessness -depressed mood -dizziness -hallucinations -redness, blistering, peeling or loosening of the skin, including inside the mouth -seizures -vomiting Side effects that usually do not require medical attention (report to your doctor or health care professional if they continue or are  bothersome): -constipation -diarrhea -headache -nausea -trouble sleeping This list may not describe all possible side effects. Call your doctor for medical advice about side effects. You may report side effects to FDA at 1-800-FDA-1088. Where should I keep my medicine? Keep out of the reach of children. Store at room temperature between 15 degrees and 30 degrees C (59 degrees and 86 degrees F). Throw away any unused medicine after the expiration date. NOTE: This sheet is a summary. It may not cover all possible information. If you have questions about this medicine, talk to your doctor, pharmacist, or health care provider.  2018 Elsevier/Gold Standard (2013-07-14 14:10:42)

## 2018-04-04 ENCOUNTER — Ambulatory Visit (INDEPENDENT_AMBULATORY_CARE_PROVIDER_SITE_OTHER): Payer: Medicare Other

## 2018-04-04 ENCOUNTER — Encounter: Payer: Self-pay | Admitting: Family Medicine

## 2018-04-04 ENCOUNTER — Ambulatory Visit: Payer: Medicare Other | Admitting: Family Medicine

## 2018-04-04 VITALS — BP 133/76 | HR 71 | Temp 97.5°F | Ht 67.0 in | Wt 212.0 lb

## 2018-04-04 DIAGNOSIS — R413 Other amnesia: Secondary | ICD-10-CM | POA: Diagnosis not present

## 2018-04-04 DIAGNOSIS — I10 Essential (primary) hypertension: Secondary | ICD-10-CM

## 2018-04-04 DIAGNOSIS — J189 Pneumonia, unspecified organism: Secondary | ICD-10-CM

## 2018-04-04 DIAGNOSIS — R5383 Other fatigue: Secondary | ICD-10-CM

## 2018-04-04 NOTE — Patient Instructions (Signed)
Take the Mucinex maximum strength, blue and white in color and take 1 twice daily with a large glass of water for the next 10 to 14 days We will call with the lab work results including the thyroid profile CBC and BMP as soon as those results become available Keep follow-up appointments with neurology and radiation oncology Stay active physically and drink plenty of water

## 2018-04-04 NOTE — Progress Notes (Signed)
Subjective:    Patient ID: Ricky [name], male    DOB: [birth year], 77 y.o.   MRN: [id number]  HPI  Patient here today for one month follow up on Pneumonia.  The patient still has some cough and some congestion.  His weight is down 2 pounds his vital signs are stable and most importantly his blood pressure is good.  He is followed by Dr. Karsten Ro for his prostate cancer and by Dr. Tammi Klippel the radiologist.  The neurologist recently added and Namenda titration pack to his Aricept.  The patient continues to have some cough and congestion but is not running any fever.  The preliminary reading on the chest x-ray looks pretty normal except for slightly enlarged heart.  The patient is not coughing up anything but still has a cough.  He has had seed implants for his prostate cancer and only is followed up by the radiation oncologist and now with a neurologist because of his memory impairment.  He denies any other complaints today as far as chest pain or shortness of breath or GI symptoms or burning when he passes his water.  He does have some frequency.   Patient Active Problem List   Diagnosis Date Noted  . Sleep apnea in adult 04/08/2017  . Nocturnal leg cramps 03/23/2017  . Genetic testing 12/29/2016  . Family history of breast cancer 12/13/2016  . Family history of prostate cancer   . Malignant neoplasm prostate ([place]) 11/13/2016  . Memory difficulty 09/22/2016  . Headache syndrome 09/22/2016  . Cough variant asthma vs UACS  11/12/2015  . Hyperlipidemia 10/15/2015  . Vitamin D deficiency 03/12/2014  . BPH (benign prostatic hyperplasia) 10/01/2013  . Erectile dysfunction 10/01/2013  . PERSONAL HX COLONIC POLYPS 01/25/2010  . 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 Essential hypertension 01/13/2009   Outpatient Encounter Medications as of 04/04/2018  Medication Sig  . amLODipine (NORVASC) 10 MG tablet TAKE 1 TABLET BY MOUTH DAILY (Patient taking differently: TAKE 1 TABLET BY  MOUTH DAILY--- takes in pm)  . Cholecalciferol (VITAMIN D PO) Take 1 capsule by mouth daily.   Marland Kitchen donepezil (ARICEPT) 10 MG tablet TAKE 1 TABLET(10 MG) BY MOUTH AT BEDTIME  . doxazosin (CARDURA) 2 MG tablet TAKE 1 TABLET BY MOUTH DAILY (Patient taking differently: TAKE 1 TABLET BY MOUTH DAILY--- takes qhs)  . FIBER FORMULA PO Take 3 capsules by mouth 2 (two) times daily.  . fluticasone (FLONASE) 50 MCG/ACT nasal spray One to 2 sprays each nostril at bedtime and stay on this medication for that at least the next 4-6 weeks (Patient taking differently: One to 2 sprays each nostril at bedtime prn)  . losartan (COZAAR) 100 MG tablet TAKE 1 TABLET BY MOUTH DAILY (Patient taking differently: TAKE 1 TABLET BY MOUTH DAILY---takes  in pm)  . memantine (NAMENDA) 5 MG tablet 5 mg/day for =1 week; 5 mg twice daily for =1 week; 15 mg/day given in 5 mg and 10 mg separated doses for =1 week; then 10 mg twice daily.  Once is tolerating the 19m twice daily call for refill and will use 146mtablets.  . potassium chloride SA (K-DUR,KLOR-CON) 20 MEQ tablet TAKE 1 TABLET BY MOUTH THREE TIMES DAILY  . tamsulosin (FLOMAX) 0.4 MG CAPS capsule TAKE ONE CAPSULE BY MOUTH TWICE DAILY  . tamsulosin (FLOMAX) 0.4 MG CAPS capsule TAKE 1 CAPSULE BY MOUTH TWICE DAILY  . tolterodine (DETROL LA) 4 MG 24 hr capsule TK ONE C PO QHS  No facility-administered encounter medications on file as of 04/04/2018.      Review of Systems  Constitutional: Negative.   HENT: Positive for congestion.   Eyes: Negative.   Respiratory: Positive for cough.   Cardiovascular: Negative.   Gastrointestinal: Negative.   Endocrine: Negative.   Genitourinary: Negative.   Musculoskeletal: Negative.   Skin: Negative.   Allergic/Immunologic: Negative.   Neurological: Negative.   Hematological: Negative.   Psychiatric/Behavioral: Negative.        Objective:   Physical Exam  Constitutional: He is oriented to person, place, and time. He appears  well-developed and well-nourished. No distress.  The patient is pleasant and relaxed and has a slight difficulty and expresses himself but everything is understandable with some encouragement.  HENT:  Head: Normocephalic and atraumatic.  Right Ear: External ear normal.  Left Ear: External ear normal.  Nose: Nose normal.  Mouth/Throat: Oropharynx is clear and moist. No oropharyngeal exudate.  Eyes: Pupils are equal, round, and reactive to light. Conjunctivae and EOM are normal. Right eye exhibits no discharge. Left eye exhibits no discharge. No scleral icterus.  Neck: Normal range of motion. Neck supple. No thyromegaly present.  Cardiovascular: Normal rate, regular rhythm, normal heart sounds and intact distal pulses.  No murmur heard. Pulmonary/Chest: Effort normal and breath sounds normal. He has no wheezes. He has no rales.  Dry cough without any rales or rhonchi  Abdominal: Soft. Bowel sounds are normal. He exhibits no mass. There is no tenderness.  No liver or spleen enlargement no abdominal tenderness and no inguinal adenopathy  Musculoskeletal: Normal range of motion. He exhibits no edema or tenderness.  The patient does complain of some edema but no edema was noted in either of his feet.  He says he is not eating any extra sodium.  Lymphadenopathy:    He has no cervical adenopathy.  Neurological: He is alert and oriented to person, place, and time. He has normal reflexes. No cranial nerve deficit.  Some memory deficit.  He is on an increasing Namenda Dosepak along with the Aricept from his recent visit to the neurologist.  Skin: Skin is warm and dry. No rash noted.  Psychiatric: He has a normal mood and affect. His behavior is normal. Judgment and thought content normal.  Mood affect and behavior are normal.  Nursing note and vitals reviewed.  BP 133/76 (BP Location: Left Arm)   Pulse 71   Temp (!) 97.5 F (36.4 C) (Oral)   Ht _0  (1.702 m)   Wt 212 lb (96.2 kg)   BMI 33.20  kg/m         Assessment & Plan:  1. Pneumonia, unspecified organism -The chest was clear today and the x-ray appeared normal with a preliminary read by me. -Because of ongoing cough and congestion we will make sure that he is taking Mucinex maximum strength, blue and white in color 1 twice daily for cough and congestion with a large glass of water for the next 10 to 14 days. - DG Chest 2 View; Future - CBC with Differential/Platelet  2. Other fatigue -He does complain of ongoing fatigue and we will check his renal function hemoglobin and thyroid - BMP8+EGFR - CBC with Differential/Platelet - Thyroid Panel With TSH  3. Memory impairment -Continue to follow-up with neurology and continue with Aricept and Namenda  4. Essential hypertension -Blood pressure is good today and he will continue with current treatment  No orders of the defined types were placed in this encounter.  Patient Instructions  Take the Mucinex maximum strength, blue and white in color and take 1 twice daily with a large glass of water for the next 10 to 14 days We will call with the lab work results including the thyroid profile CBC and BMP as soon as those results become available Keep follow-up appointments with neurology and radiation oncology Stay active physically and drink plenty of water  Arrie Senate MD

## 2018-04-05 LAB — CBC WITH DIFFERENTIAL/PLATELET
BASOS ABS: 0 10*3/uL (ref 0.0–0.2)
Basos: 1 %
EOS (ABSOLUTE): 0.1 10*3/uL (ref 0.0–0.4)
Eos: 3 %
Hematocrit: 41.4 % (ref 37.5–51.0)
Hemoglobin: 13.9 g/dL (ref 13.0–17.7)
Immature Grans (Abs): 0 10*3/uL (ref 0.0–0.1)
Immature Granulocytes: 0 %
LYMPHS ABS: 1.2 10*3/uL (ref 0.7–3.1)
Lymphs: 29 %
MCH: 28.3 pg (ref 26.6–33.0)
MCHC: 33.6 g/dL (ref 31.5–35.7)
MCV: 84 fL (ref 79–97)
MONOS ABS: 0.6 10*3/uL (ref 0.1–0.9)
Monocytes: 15 %
Neutrophils Absolute: 2.2 10*3/uL (ref 1.4–7.0)
Neutrophils: 52 %
Platelets: 202 10*3/uL (ref 150–450)
RBC: 4.91 x10E6/uL (ref 4.14–5.80)
RDW: 14.4 % (ref 12.3–15.4)
WBC: 4.1 10*3/uL (ref 3.4–10.8)

## 2018-04-05 LAB — BMP8+EGFR
BUN / CREAT RATIO: 13 (ref 10–24)
BUN: 13 mg/dL (ref 8–27)
CHLORIDE: 103 mmol/L (ref 96–106)
CO2: 25 mmol/L (ref 20–29)
Calcium: 9.7 mg/dL (ref 8.6–10.2)
Creatinine, Ser: 1.04 mg/dL (ref 0.76–1.27)
GFR calc Af Amer: 80 mL/min/{1.73_m2} (ref >59)
GFR calc non Af Amer: 69 mL/min/{1.73_m2} (ref >59)
GLUCOSE: 89 mg/dL (ref 65–99)
POTASSIUM: 3.4 mmol/L — AB (ref 3.5–5.2)
SODIUM: 142 mmol/L (ref 134–144)

## 2018-04-05 LAB — THYROID PANEL WITH TSH
Free Thyroxine Index: 2 (ref 1.2–4.9)
T3 Uptake Ratio: 28 % (ref 24–39)
T4 TOTAL: 7.2 ug/dL (ref 4.5–12.0)
TSH: 1.22 u[IU]/mL (ref 0.450–4.500)

## 2018-04-12 ENCOUNTER — Other Ambulatory Visit: Payer: Medicare Other

## 2018-04-12 ENCOUNTER — Other Ambulatory Visit: Payer: Self-pay | Admitting: Family Medicine

## 2018-04-12 DIAGNOSIS — E876 Hypokalemia: Secondary | ICD-10-CM

## 2018-04-13 LAB — BMP8+EGFR
BUN / CREAT RATIO: 11 (ref 10–24)
BUN: 13 mg/dL (ref 8–27)
CHLORIDE: 106 mmol/L (ref 96–106)
CO2: 26 mmol/L (ref 20–29)
Calcium: 9.7 mg/dL (ref 8.6–10.2)
Creatinine, Ser: 1.22 mg/dL (ref 0.76–1.27)
GFR calc Af Amer: 66 mL/min/{1.73_m2} (ref >59)
GFR calc non Af Amer: 57 mL/min/{1.73_m2} — ABNORMAL LOW (ref >59)
GLUCOSE: 77 mg/dL (ref 65–99)
POTASSIUM: 3.7 mmol/L (ref 3.5–5.2)
SODIUM: 143 mmol/L (ref 134–144)

## 2018-04-15 ENCOUNTER — Telehealth: Payer: Self-pay | Admitting: Family Medicine

## 2018-04-15 NOTE — Telephone Encounter (Signed)
Pt aware of test results

## 2018-04-21 ENCOUNTER — Other Ambulatory Visit: Payer: Self-pay | Admitting: Neurology

## 2018-05-09 ENCOUNTER — Telehealth: Payer: Self-pay | Admitting: Adult Health

## 2018-05-09 ENCOUNTER — Other Ambulatory Visit: Payer: Self-pay | Admitting: Family Medicine

## 2018-05-09 DIAGNOSIS — Z8546 Personal history of malignant neoplasm of prostate: Secondary | ICD-10-CM | POA: Diagnosis not present

## 2018-05-09 NOTE — Telephone Encounter (Signed)
Pt's wife Ann/DPR advised mamentine is making him dizzy on a daily basis. He is taking 10mg  twice daily.  Please call to advise how to proceed.

## 2018-05-09 NOTE — Telephone Encounter (Signed)
I called patient, he is having some dizziness on the full dose Namenda, they will cut back to 10 mg daily or take one half of a 10 mg tablet twice daily to see if this alleviates some of the symptoms.  They will call for continued to have problems.

## 2018-05-13 ENCOUNTER — Ambulatory Visit (INDEPENDENT_AMBULATORY_CARE_PROVIDER_SITE_OTHER): Payer: Medicare Other

## 2018-05-13 ENCOUNTER — Ambulatory Visit (INDEPENDENT_AMBULATORY_CARE_PROVIDER_SITE_OTHER): Payer: Medicare Other | Admitting: Family Medicine

## 2018-05-13 VITALS — BP 130/76 | HR 64 | Temp 97.9°F | Ht 67.0 in | Wt 213.0 lb

## 2018-05-13 DIAGNOSIS — W19XXXA Unspecified fall, initial encounter: Secondary | ICD-10-CM

## 2018-05-13 DIAGNOSIS — R0781 Pleurodynia: Secondary | ICD-10-CM | POA: Diagnosis not present

## 2018-05-13 DIAGNOSIS — R300 Dysuria: Secondary | ICD-10-CM | POA: Diagnosis not present

## 2018-05-13 DIAGNOSIS — S4992XA Unspecified injury of left shoulder and upper arm, initial encounter: Secondary | ICD-10-CM | POA: Diagnosis not present

## 2018-05-13 DIAGNOSIS — M79645 Pain in left finger(s): Secondary | ICD-10-CM | POA: Diagnosis not present

## 2018-05-13 DIAGNOSIS — M79642 Pain in left hand: Secondary | ICD-10-CM | POA: Diagnosis not present

## 2018-05-13 LAB — URINALYSIS, COMPLETE
BILIRUBIN UA: NEGATIVE
Glucose, UA: NEGATIVE
LEUKOCYTES UA: NEGATIVE
Nitrite, UA: NEGATIVE
PH UA: 6 (ref 5.0–7.5)
RBC, UA: NEGATIVE
Specific Gravity, UA: 1.02 (ref 1.005–1.030)
Urobilinogen, Ur: 1 mg/dL (ref 0.2–1.0)

## 2018-05-13 LAB — MICROSCOPIC EXAMINATION
Epithelial Cells (non renal): NONE SEEN /hpf (ref 0–10)
Renal Epithel, UA: NONE SEEN /hpf
WBC UA: NONE SEEN /HPF (ref 0–5)

## 2018-05-13 MED ORDER — NAPROXEN 500 MG PO TABS: 500.0000 mg | ORAL_TABLET | Freq: Two times a day (BID) | ORAL | 0 refills | Status: DC

## 2018-05-13 NOTE — Progress Notes (Signed)
Subjective: CC: side pain PCP: Chipper Herb, MD DVV:[id number] Zenith Lamphier III is a 77 y.o. male presenting to clinic today for:  1. Side pain Patient reports a several day history of left-sided rib pain.  He notes that this is worse with deep breathing or coughing.  He notes that this started after he was at home reaching over to grab something.  Denies any substernal chest pain, hemoptysis, shortness of breath.  He reports that he was diagnosed with pneumonia back in June and had repeat chest x-ray which notes resolution of pneumonia.  Denies any fevers, chills.  He does note that he overall feels poorly.  He took Tylenol last evening which may or may not have helped.  He notes that typically when he takes Tylenol he gets good relief but he still feels like he is having some discomfort.  2. Dysuria Patient reports a long-standing history, greater than 1 year, of intermittent pain with peeing.  He notes urinary frequency, citing that he urinates about every hour with sleeping.  He reports a past medical history of prostate cancer for which she was treated by urology.  He actually saw his urologist August 1 and was told that everything looked good.  PSA was within normal limits.  He notes that he got distracted during exam and did not discuss his urinary symptoms.  Currently treated with Flomax and Detrol.  3. Fall Patient sustained a mechanical fall prior to arrival at home.  He notes that he jammed his left fingers.  Reports pain at the knuckle.  He notes he has a poorly healed fracture from a previous fall in the left hand.  He has full active range of motion but notes pain with flexion.  ROS: Per HPI  No Known Allergies Past Medical History:  Diagnosis Date  . Chronic cough   . Deviated nasal septum   . 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 Family history of prostate cancer   . GERD (gastroesophageal reflux disease)   . History of adenomatous polyp of colon      2008; 2011  . History of exercise stress test    01-14-2009 by dr hochrein-- normal w/ no ischemia  . Hot flashes    DUE TO HORMONE THERAPY FOR PROSTATE CANCER  . Hyperlipidemia   . Hypertension   . Mild memory disturbance    per dr Jannifer Franklin note (neurologist)  . Nocturia more than twice per night   . Nocturnal leg cramps 03/23/2017  . OA (osteoarthritis)   . OSA (obstructive sleep apnea)    moderate osa per study 02-12-2017  cpap recommended--- per pt never heard back from doctor for obtaining cpap  . Pneumonia 02/2018  . Prostate cancer [facility]) urologist-  dr ottelin/  oncologist-  dr Tammi Klippel   dx 10-10-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, Gleason 3+4, PSA 9.02, vol 78cc started ADT to decrease size;  08/ 2018 prostate decrease size vol 46cc and PSA 0.36--- scheduled for radiative prostate seed implants    Current Outpatient Medications:  .  amLODipine (NORVASC) 10 MG tablet, TAKE 1 TABLET BY MOUTH EVERY DAY, Disp: 90 tablet, Rfl: 0 .  Cholecalciferol (VITAMIN D PO), Take 1 capsule by mouth daily. , Disp: , Rfl:  .  donepezil (ARICEPT) 10 MG tablet, TAKE 1 TABLET(10 MG) BY MOUTH AT BEDTIME, Disp: 90 tablet, Rfl: 0 .  doxazosin (CARDURA) 2 MG tablet, TAKE 1 TABLET BY MOUTH DAILY, Disp: 90 tablet, Rfl: 0 .  FIBER FORMULA PO, Take 3 capsules by mouth 2 (two) times daily., Disp: , Rfl:  .  fluticasone (FLONASE) 50 MCG/ACT nasal spray, One to 2 sprays each nostril at bedtime and stay on this medication for that at least the next 4-6 weeks (Patient taking differently: One to 2 sprays each nostril at bedtime prn), Disp: 16 g, Rfl: 6 .  losartan (COZAAR) 100 MG tablet, TAKE 1 TABLET BY MOUTH EVERY DAY, Disp: 90 tablet, Rfl: 0 .  memantine (NAMENDA) 10 MG tablet, Take 1 tablet (10 mg total) by mouth 2 (two) times daily., Disp: 180 tablet, Rfl: 3 .  potassium chloride SA (K-DUR,KLOR-CON) 20 MEQ tablet, TAKE 1 TABLET BY MOUTH THREE TIMES DAILY, Disp: 270 tablet, Rfl: 0 .  tamsulosin (FLOMAX) 0.4 MG CAPS capsule, TAKE  ONE CAPSULE BY MOUTH TWICE DAILY, Disp: 180 capsule, Rfl: 0 .  tolterodine (DETROL LA) 4 MG 24 hr capsule, TK ONE C PO QHS, Disp: , Rfl: 10 Social History   Socioeconomic History  . Marital status: Married    Spouse name: [name]  . Number of children: 3  . Years of education: BA  . Highest education level: Not on file  Occupational History  . Occupation: Retired  Scientific laboratory technician  . Financial resource strain: Not on file  . Food insecurity:    Worry: Not on file    Inability: Not on file  . Transportation needs:    Medical: Not on file    Non-medical: Not on file  Tobacco Use  . Smoking status: Never Smoker  . Smokeless tobacco: Never Used  Substance and Sexual Activity  . Alcohol use: Yes    Comment: rare  . Drug use: No  . Sexual activity: Yes    Partners: Female    Comment: vasectomy  Lifestyle  . Physical activity:    Days per week: Not on file    Minutes per session: Not on file  . Stress: Not on file  Relationships  . Social connections:    Talks on phone: Not on file    Gets together: Not on file    Attends religious service: Not on file    Active member of club or organization: Not on file    Attends meetings of clubs or organizations: Not on file    Relationship status: Not on file  . Intimate partner violence:    Fear of current or ex partner: Not on file    Emotionally abused: Not on file    Physically abused: Not on file    Forced sexual activity: Not on file  Other Topics Concern  . Not on file  Social History Narrative   Lives at home w/ his wife   Right-handed   Caffeine: none   Family History  Problem Relation Age of Onset  . Healthy Mother   . Prostate cancer Father        Dx 28s; Deceased 3  . Prostate cancer Brother 32       currently 39  . Alcohol abuse Brother   . Bipolar disorder Brother   . Prostate cancer Paternal Uncle        unsure of age; possibly 2nd uncle also has prostate ca  . Prostate cancer Maternal Uncle        unsure of  age  . Breast cancer Maternal Aunt        unsure of age  . Colon cancer Neg Hx   . Esophageal cancer Neg Hx   .  Rectal cancer Neg Hx   . Stomach cancer Neg Hx     Objective: Office vital signs reviewed. BP 130/76   Pulse 64   Temp 97.9 F (36.6 C) (Oral)   Ht 5\' 7"  (1.702 m)   Wt 213 lb (96.6 kg)   BMI 33.36 kg/m   Physical Examination:  General: Awake, alert, anxious appearing, No acute distress HEENT: sclera white, MMM Cardio: regular rate and rhythm, S1S2 heard, no murmurs appreciated Pulm: clear to auscultation bilaterally, no wheezes, rhonchi or rales; normal work of breathing on room air Chest: no TTP to chest wall. No palpable abnormalities. Extremities: warm, well perfused, No edema, cyanosis or clubbing; +2 pulses bilaterally MSK: normal gait  Left hand: Wrapped in ice pack.  He does have some tenderness to palpation along the ring and middle fingers.  There is some mild swelling noted along the middle finger.  No gross discoloration or ecchymosis noted.  Light touch sensation grossly intact and has full active range of motion.  Dg Chest 2 View  Result Date: 05/13/2018 CLINICAL DATA:  Left-sided rib pain. EXAM: CHEST - 2 VIEW COMPARISON:  Chest x-ray dated April 04, 2018. FINDINGS: Stable mild cardiomegaly. Normal pulmonary vascularity. No focal consolidation, pleural effusion, or pneumothorax. Minimal left basilar atelectasis/scarring. No acute osseous abnormality. IMPRESSION: No active cardiopulmonary disease. Electronically Signed   By: Titus Dubin M.D.   On: 05/13/2018 14:58   Dg Hand Complete Left  Result Date: 05/13/2018 CLINICAL DATA:  Fall.  Pain. 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 Degenerative changes at the base of the first metacarpal. Degenerative changes at the third MCP joint. Degenerative changes at the fourth proximal interphalangeal joint. Other scattered degenerative  changes noted. No dislocations or fractures. IMPRESSION: Scattered degenerative changes. 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 No acute fracture or traumatic malalignment. Electronically Signed   By: Dorise Bullion III M.D   On: 05/13/2018 14:57    Assessment/ Plan: 77 y.o. male who has multiple concerns today but unfortunately arrived to his appointment late and was only scheduled for a 15-minute slot.  I attempted to address as many concerns as possible but unfortunately time did not permit me to address everything he had concerns about.  I did recommend that he follow-up with his PCP in the next couple of weeks for further discussion of multiple concerns and review of recent labs.  I did provide him a copy of the labs during today's visit.  1. Dysuria Urinalysis did not demonstrate overt evidence of infection.  He had 1+ protein on urine dip and calcium oxalate crystals with very few bacteria noted on urine microscopy.  I sent this for urine culture to evaluate further for possible infection.  Will contact patient with results once available.  I do wonder if his dysuria and urinary frequency is more result of prostate cancer treatments.  I did recommend that he follow-up with his urologist again to discuss this further, particularly since he had a recent evaluation that was noted to be "good". - Urinalysis, Complete - Urine Culture  2. Rib pain on left side I suspect that symptoms are more costochondritis, particularly given onset after having exerted himself at home.  Chest x-ray was obtained for completion but did not demonstrate any acute abnormalities.  He has normal work of breathing and normal cardiopulmonary exam.  I have prescribed him Naprosyn to take p.o. twice daily as needed rib pain  and finger pain as below.  Recommended follow-up with PCP in 2 weeks for recheck. - DG Chest 2 View; Future  3. Fall, initial encounter - DG Hand Complete Left; Future  4. Pain of  finger of left hand No evidence of acute fracture on my review.  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  Will defer this to PCP for continued monitoring and treatment.  Doubt that pseudogout is playing a part given acute nature of symptoms. - DG Hand Complete Left; Future   Orders Placed This Encounter  Procedures  . Urine Culture  . Microscopic Examination  . DG Chest 2 View    Standing Status:   Future    Number of Occurrences:   1    Standing Expiration Date:   07/14/2019    Order Specific Question:   Reason for Exam (SYMPTOM  OR DIAGNOSIS REQUIRED)    Answer:   Left sided rib pain with deep breath    Order Specific Question:   Preferred imaging location?    Answer:   Internal    Order Specific Question:   Radiology Contrast Protocol - do NOT remove file path    Answer:   \\[email or web address]  . DG Hand Complete Left    Standing Status:   Future    Number of Occurrences:   1    Standing Expiration Date:   07/14/2019    Order Specific Question:   Reason for Exam (SYMPTOM  OR DIAGNOSIS REQUIRED)    Answer:   fall, pain in left 4th finger    Order Specific Question:   Preferred imaging location?    Answer:   Internal    Order Specific Question:   Radiology Contrast Protocol - do NOT remove file path    Answer:   \\[email or web address]  . Urinalysis, Complete   Meds ordered this encounter  Medications  . DISCONTD: naproxen (NAPROSYN) 500 MG tablet    Sig: Take 1 tablet (500 mg total) by mouth 2 (two) times daily with a meal.    Dispense:  30 tablet    Refill:  0  . naproxen (NAPROSYN) 500 MG tablet    Sig: Take 1 tablet (500 mg total) by mouth 2 (two) times daily with a meal.    Dispense:  30 tablet    Refill:  0     Ilamae Geng Windell Moulding, DO [place] [phone]

## 2018-05-13 NOTE — Patient Instructions (Addendum)
Your chest x-ray and hand x-ray did not demonstrate any acute findings today.  I am going to treat you for costochondritis given persistent cough.  I would like you to follow-up with Dr. Laurance Flatten in the next 2 to 3 weeks for recheck.  You have prescribed a nonsteroidal anti-inflammatory drug (NSAID) today. This will help with your pain and inflammation. Please do not take any other NSAIDs (ibuprofen/Motrin/Advil, naproxen/Aleve, meloxicam/Mobic, Voltaren/diclofenac). Please make sure to eat a meal when taking this medication.   Caution:  If you have a history of acid reflux/indigestion, I recommend that you take an antacid (such as Prilosec, Prevacid) daily while on the NSAID.  If you have a history of bleeding disorder, gastric ulcer, are on a blood thinner (like warfarin/Coumadin, Xarelto, Eliquis, etc) please do not take NSAID.  If you have ever had a heart attack, you should not take NSAIDs.  Jammed Finger A jammed finger is an injury to the ligaments that support your finger bones. Ligaments are strong bands of tissue that connect bones and keep them in place. This injury happens when the ligaments are stretched beyond their normal range of motion (sprained). What are the causes? A jammed finger is caused by a hard direct hit to the tip of your finger that pushes your finger toward your hand. What increases the risk? This injury is more likely to happen if you play sports. What are the signs or symptoms? Symptoms of a jammed finger include:  Pain.  Swelling.  Discoloration and bruising around the joint.  Difficulty bending or straightening the finger.  Not being able to use the finger normally.  How is this diagnosed? A jammed finger is diagnosed with a medical history and physical exam. You may also have X-rays taken to check for a broken bone (fracture). How is this treated? Treatment for a jammed finger may include:  Wearing a splint.  Taping the injured finger to the  fingers beside it (buddy taping).  Medicines used to treat pain.  Depending on the type of injury, you may have to do exercises after your finger has begun to heal. This helps you regain strength and mobility in the finger. Follow these instructions at home:  Take medicines only as directed by your health care provider.  Apply ice to the injured area: ? Put ice in a plastic bag. ? Place a towel between your skin and the bag. ? Leave the ice on for 20 minutes, 2-3 times per day.  Raise the injured area above the level of your heart while you are sitting or lying down.  Wear the splint or tape as directed by your health care provider. Remove it only as directed by your health care provider.  Rest your finger until your health care provider says you can move it again. Your finger may feel stiff and painful for a while.  Perform strengthening exercises as directed by your health care provider. It may help to start doing these exercises with your hand in a bowl of warm water.  Keep all follow-up visits as directed by your health care provider. This is important. Contact a health care provider if:  You have pain or swelling that is getting worse.  Your finger feels cold.  Your finger looks out of place at the joint (deformity).  You still cannot extend your finger after treatment.  You have a fever. Get help right away if:  Even after loosening your splint, your finger: ? Is very red and swollen. ?  Is white or blue. ? Feels tingly or becomes numb. This information is not intended to replace advice given to you by your health care provider. Make sure you discuss any questions you have with your health care provider. Document Released: 03/15/2010 Document Revised: 03/02/2016 Document Reviewed: 07/29/2014 Elsevier Interactive Patient Education  [employer].

## 2018-05-14 LAB — URINE CULTURE: ORGANISM ID, BACTERIA: NO GROWTH

## 2018-05-28 DIAGNOSIS — Z8546 Personal history of malignant neoplasm of prostate: Secondary | ICD-10-CM | POA: Diagnosis not present

## 2018-05-28 DIAGNOSIS — R300 Dysuria: Secondary | ICD-10-CM | POA: Diagnosis not present

## 2018-06-05 ENCOUNTER — Other Ambulatory Visit: Payer: Self-pay | Admitting: Family Medicine

## 2018-07-10 ENCOUNTER — Ambulatory Visit (INDEPENDENT_AMBULATORY_CARE_PROVIDER_SITE_OTHER): Payer: Medicare Other | Admitting: Family Medicine

## 2018-07-10 ENCOUNTER — Encounter: Payer: Self-pay | Admitting: Family Medicine

## 2018-07-10 VITALS — BP 152/93 | HR 69 | Temp 97.3°F | Ht 67.0 in | Wt 212.0 lb

## 2018-07-10 DIAGNOSIS — Z23 Encounter for immunization: Secondary | ICD-10-CM

## 2018-07-10 DIAGNOSIS — N400 Enlarged prostate without lower urinary tract symptoms: Secondary | ICD-10-CM

## 2018-07-10 DIAGNOSIS — E559 Vitamin D deficiency, unspecified: Secondary | ICD-10-CM

## 2018-07-10 DIAGNOSIS — R413 Other amnesia: Secondary | ICD-10-CM

## 2018-07-10 DIAGNOSIS — E785 Hyperlipidemia, unspecified: Secondary | ICD-10-CM

## 2018-07-10 DIAGNOSIS — I10 Essential (primary) hypertension: Secondary | ICD-10-CM | POA: Diagnosis not present

## 2018-07-10 DIAGNOSIS — C61 Malignant neoplasm of prostate: Secondary | ICD-10-CM

## 2018-07-10 MED ORDER — MEMANTINE HCL 5 MG PO TABS: 5.0000 mg | ORAL_TABLET | Freq: Two times a day (BID) | ORAL | 3 refills | Status: DC

## 2018-07-10 NOTE — Patient Instructions (Addendum)
Medicare Annual Wellness Visit  [facility] and the medical providers at [facility] strive to bring you the best medical care.  In doing so we not only want to address your current medical conditions and concerns but also to detect new conditions early and prevent illness, disease and health-related problems.    Medicare offers a yearly Wellness Visit which allows our clinical staff to assess your need for preventative services including immunizations, lifestyle education, counseling to decrease risk of preventable diseases and screening for fall risk and other medical concerns.    This visit is provided free of charge (no copay) for all Medicare recipients. The clinical pharmacists at [facility] have begun to conduct these Wellness Visits which will also include a thorough review of all your medications.    As you primary medical provider recommend that you make an appointment for your Annual Wellness Visit if you have not done so already this year.  You may set up this appointment before you leave today or you may call back ([phone]) and schedule an appointment.  Please make sure when you call that you mention that you are scheduling your Annual Wellness Visit with the clinical pharmacist so that the appointment may be made for the proper length of time.     Continue current medications. Continue good therapeutic lifestyle changes which include good diet and exercise. Fall precautions discussed with patient. If an FOBT was given today- please return it to our front desk. If you are over 70 years old - you may need Prevnar 76 or the adult Pneumonia vaccine.  **Flu shots are available--- please call and schedule a FLU-CLINIC appointment**  After your visit with [place] today you will receive a survey in the mail or online from [employer] regarding your care with [place]. Please take a moment to fill this out. Your feedback is very  important to [place] as you can help [place] better understand your patient needs as well as improve your experience and satisfaction. WE CARE ABOUT YOU!!!   Follow-up with urology as planned Take blood pressure medicines regularly and try not to miss any days of taking the medicine Continue with 10 mg of Aricept We will write a prescription for the 5 mg of Namenda and we would ask you to stay on that since you had less dizziness with that strength and with the 10 mg strength.

## 2018-07-10 NOTE — Progress Notes (Signed)
Subjective:    Patient ID: Ricky [name], male    DOB: [birth year], 77 y.o.   MRN: [id number]  HPI Pt here for follow up and management of chronic medical problems which includes hyperlipidemia and hypertension. He is taking medication regularly.  The patient has several complaints today.  He complains of dizziness and he is concerned about a lesion on his back.  He has had swelling in his legs along with fatigue.  His blood pressure initially today was elevated but the patient forgot to take his medicine today.  He will get lab work today and will be given an FOBT to return.  He gets his prostate and PSA done at [facility] because of prostate cancer.  The prostate cancer is followed by Dr. Karsten Ro.  The patient has GERD hypertension and hyperlipidemia.  Patient today denies any chest pain.  He denies any shortness of breath.  He denies any trouble with his stomach including nausea vomiting diarrhea or blood in the stool or black tarry bowel movements or change in bowel habits.  He does indicate that sometimes he feels like he is going to have a bowel movement when he is sitting on the commode in the middle the night passing his water.  He does continue to have some concern about passing his water and being uncomfortable with passing his water and was told by the urologist that some of this could be radiation related to his prostate cancer.  The patient also indicates that he has had more dizziness when he went up to the 10 mg strength of Namenda.  We will have him go back to the 5 mg strength of Namenda.  He will stay on 10 mg of Aricept.     Patient Active Problem List   Diagnosis Date Noted  . Sleep apnea in adult 04/08/2017  . Nocturnal leg cramps 03/23/2017  . Genetic testing 12/29/2016  . Family history of breast cancer 12/13/2016  . Family history of prostate cancer   . Malignant neoplasm prostate ([place]) 11/13/2016  . Memory difficulty 09/22/2016  . Headache syndrome  09/22/2016  . Cough variant asthma vs UACS  11/12/2015  . Hyperlipidemia 10/15/2015  . Vitamin D deficiency 03/12/2014  . BPH (benign prostatic hyperplasia) 10/01/2013  . Erectile dysfunction 10/01/2013  . PERSONAL HX COLONIC POLYPS 01/25/2010  . 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 Essential hypertension 01/13/2009   Outpatient Encounter Medications as of 07/10/2018  Medication Sig  . amLODipine (NORVASC) 10 MG tablet TAKE 1 TABLET BY MOUTH EVERY DAY  . Cholecalciferol (VITAMIN D PO) Take 1 capsule by mouth daily.   Marland Kitchen donepezil (ARICEPT) 10 MG tablet TAKE 1 TABLET(10 MG) BY MOUTH AT BEDTIME  . doxazosin (CARDURA) 2 MG tablet TAKE 1 TABLET BY MOUTH DAILY  . FIBER FORMULA PO Take 3 capsules by mouth 2 (two) times daily.  . fluticasone (FLONASE) 50 MCG/ACT nasal spray One to 2 sprays each nostril at bedtime and stay on this medication for that at least the next 4-6 weeks (Patient taking differently: One to 2 sprays each nostril at bedtime prn)  . losartan (COZAAR) 100 MG tablet TAKE 1 TABLET BY MOUTH EVERY DAY  . memantine (NAMENDA) 10 MG tablet Take 1 tablet (10 mg total) by mouth 2 (two) times daily.  . naproxen (NAPROSYN) 500 MG tablet Take 1 tablet (500 mg total) by mouth 2 (two) times daily with a meal.  . potassium chloride SA (K-DUR,KLOR-CON) 20 MEQ tablet TAKE  1 TABLET BY MOUTH THREE TIMES DAILY  . tamsulosin (FLOMAX) 0.4 MG CAPS capsule TAKE ONE CAPSULE BY MOUTH TWICE DAILY  . tolterodine (DETROL LA) 4 MG 24 hr capsule TK ONE C PO QHS   No facility-administered encounter medications on file as of 07/10/2018.      Review of Systems  Constitutional: Positive for fatigue.  HENT: Negative.   Eyes: Negative.   Respiratory: Negative.   Cardiovascular: Positive for leg swelling (ankles ).  Gastrointestinal: Negative.   Endocrine: Negative.   Genitourinary: Negative.   Musculoskeletal: Negative.   Skin: Negative.        Skin lesion on back   Allergic/Immunologic:  Negative.   Neurological: Positive for dizziness.  Hematological: Negative.   Psychiatric/Behavioral: Negative.        Objective:   Physical Exam  Constitutional: He is oriented to person, place, and time. He appears well-developed and well-nourished. No distress.  Patient is pleasant and alert and continues to have mild memory impairment.  HENT:  Head: Normocephalic and atraumatic.  Right Ear: External ear normal.  Left Ear: External ear normal.  Mouth/Throat: Oropharynx is clear and moist. No oropharyngeal exudate.  Nasal turbinate congestion bilaterally  Eyes: Pupils are equal, round, and reactive to light. Conjunctivae and EOM are normal. Right eye exhibits no discharge. Left eye exhibits no discharge. No scleral icterus.  Neck: Normal range of motion. Neck supple. No thyromegaly present.  No bruits thyromegaly or anterior cervical adenopathy  Cardiovascular: Normal rate, regular rhythm, normal heart sounds and intact distal pulses.  No murmur heard. Heart has a regular rate and rhythm at 72/min.  Distal pulses were more difficult to palpate on the right than the left.  Pulmonary/Chest: Effort normal and breath sounds normal. He has no wheezes. He has no rales. He exhibits no tenderness.  Clear anteriorly and posteriorly with no axillary adenopathy or chest wall masses.  Abdominal: Soft. Bowel sounds are normal. He exhibits no mass. There is no tenderness.  No liver or spleen enlargement epigastric tenderness bruits or inguinal adenopathy.  No masses.  Genitourinary:  Genitourinary Comments: This is followed regularly by his urologist  Musculoskeletal: Normal range of motion. He exhibits edema. He exhibits no tenderness.  Bilateral knee replacement with minimal pretibial edema  Lymphadenopathy:    He has no cervical adenopathy.  Neurological: He is alert and oriented to person, place, and time. He has normal reflexes. No cranial nerve deficit.  Reflexes are equal bilaterally and  patient responds appropriately to questions asked of him with some delay in response.  Skin: Skin is warm and dry. No rash noted.  Irritated lesion left posterior shoulder that looks like it is almost been pulled from its base.  The remainder of the back appears normal.  Patient has appointment with dermatology and he will show the dermatologist this lesion.  Psychiatric: He has a normal mood and affect. His behavior is normal. Judgment and thought content normal.  The patient's mood affect and behavior are normal for him today.  Nursing note and vitals reviewed.   BP (!) 152/93 (BP Location: Left Arm)   Pulse 69   Temp (!) 97.3 F (36.3 C) (Oral)   Ht _0  (1.702 m)   Wt 212 lb (96.2 kg)   BMI 33.20 kg/m        Assessment & Plan:  1. Hyperlipidemia with target LDL less than 100 -Continue with current treatment pending results of lab work - CBC with Differential/Platelet - Lipid panel  2. Prostate cancer [facility]) -Follow-up with urology as planned - CBC with Differential/Platelet  3. Vitamin D deficiency -Continue with vitamin D replacement pending results of lab work - CBC with Differential/Platelet - VITAMIN D 25 Hydroxy (Vit-D Deficiency, Fractures)  4. Benign prostatic hyperplasia, unspecified whether lower urinary tract symptoms present -Patient actually has had prostate cancer with radiation and continues to follow-up with a urologist for this.  He has some dysuria which the urologist suspects may be related to his radiation treatment. - CBC with Differential/Platelet  5. Essential hypertension -The blood pressure was elevated today but the patient did not take any of his medicines. - BMP8+EGFR - CBC with Differential/Platelet - Hepatic function panel  6. Memory impairment -Patient continues to have memory impairment.  He is on Aricept 10 mg once a day and he is supposed to be on Namenda 10 mg daily.  He indicates that since he has been taking the 10 mg of Namenda  that he has had more dizziness and on his refill will go back to the 5 mg of Namenda daily. - CBC with Differential/Platelet  7. Encounter for immunization - Flu vaccine HIGH DOSE PF   Patient Instructions                       Medicare Annual Wellness Visit  [facility] and the medical providers at [facility] strive to bring you the best medical care.  In doing so we not only want to address your current medical conditions and concerns but also to detect new conditions early and prevent illness, disease and health-related problems.    Medicare offers a yearly Wellness Visit which allows our clinical staff to assess your need for preventative services including immunizations, lifestyle education, counseling to decrease risk of preventable diseases and screening for fall risk and other medical concerns.    This visit is provided free of charge (no copay) for all Medicare recipients. The clinical pharmacists at [facility] have begun to conduct these Wellness Visits which will also include a thorough review of all your medications.    As you primary medical provider recommend that you make an appointment for your Annual Wellness Visit if you have not done so already this year.  You may set up this appointment before you leave today or you may call back ([phone]) and schedule an appointment.  Please make sure when you call that you mention that you are scheduling your Annual Wellness Visit with the clinical pharmacist so that the appointment may be made for the proper length of time.     Continue current medications. Continue good therapeutic lifestyle changes which include good diet and exercise. Fall precautions discussed with patient. If an FOBT was given today- please return it to our front desk. If you are over 83 years old - you may need Prevnar 62 or the adult Pneumonia vaccine.  **Flu shots are available--- please call and schedule a  FLU-CLINIC appointment**  After your visit with [place] today you will receive a survey in the mail or online from [employer] regarding your care with [place]. Please take a moment to fill this out. Your feedback is very important to [place] as you can help [place] better understand your patient needs as well as improve your experience and satisfaction. WE CARE ABOUT YOU!!!   Follow-up with urology as planned Take blood pressure medicines regularly and try not to miss any days of taking the medicine Continue  with 10 mg of Aricept We will write a prescription for the 5 mg of Namenda and we would ask you to stay on that since you had less dizziness with that strength and with the 10 mg strength.  Arrie Senate MD

## 2018-07-11 LAB — CBC WITH DIFFERENTIAL/PLATELET
BASOS ABS: 0 10*3/uL (ref 0.0–0.2)
Basos: 1 %
EOS (ABSOLUTE): 0.1 10*3/uL (ref 0.0–0.4)
EOS: 2 %
HEMATOCRIT: 41.6 % (ref 37.5–51.0)
HEMOGLOBIN: 14.1 g/dL (ref 13.0–17.7)
Immature Grans (Abs): 0 10*3/uL (ref 0.0–0.1)
Immature Granulocytes: 0 %
LYMPHS ABS: 1.2 10*3/uL (ref 0.7–3.1)
Lymphs: 26 %
MCH: 28.4 pg (ref 26.6–33.0)
MCHC: 33.9 g/dL (ref 31.5–35.7)
MCV: 84 fL (ref 79–97)
MONOCYTES: 13 %
Monocytes Absolute: 0.6 10*3/uL (ref 0.1–0.9)
NEUTROS ABS: 2.7 10*3/uL (ref 1.4–7.0)
Neutrophils: 58 %
Platelets: 206 10*3/uL (ref 150–450)
RBC: 4.96 x10E6/uL (ref 4.14–5.80)
RDW: 13.5 % (ref 12.3–15.4)
WBC: 4.7 10*3/uL (ref 3.4–10.8)

## 2018-07-11 LAB — HEPATIC FUNCTION PANEL
ALBUMIN: 4.6 g/dL (ref 3.5–4.8)
ALK PHOS: 94 IU/L (ref 39–117)
ALT: 21 IU/L (ref 0–44)
AST: 22 IU/L (ref 0–40)
BILIRUBIN, DIRECT: 0.17 mg/dL (ref 0.00–0.40)
Bilirubin Total: 0.6 mg/dL (ref 0.0–1.2)
Total Protein: 6.4 g/dL (ref 6.0–8.5)

## 2018-07-11 LAB — BMP8+EGFR
BUN / CREAT RATIO: 12 (ref 10–24)
BUN: 13 mg/dL (ref 8–27)
CHLORIDE: 103 mmol/L (ref 96–106)
CO2: 25 mmol/L (ref 20–29)
Calcium: 9.9 mg/dL (ref 8.6–10.2)
Creatinine, Ser: 1.09 mg/dL (ref 0.76–1.27)
GFR calc non Af Amer: 65 mL/min/{1.73_m2} (ref >59)
GFR, EST AFRICAN AMERICAN: 75 mL/min/{1.73_m2} (ref >59)
Glucose: 84 mg/dL (ref 65–99)
Potassium: 3.6 mmol/L (ref 3.5–5.2)
Sodium: 142 mmol/L (ref 134–144)

## 2018-07-11 LAB — LIPID PANEL
CHOLESTEROL TOTAL: 182 mg/dL (ref 100–199)
Chol/HDL Ratio: 3.4 ratio (ref 0.0–5.0)
HDL: 53 mg/dL (ref >39)
LDL Calculated: 108 mg/dL — ABNORMAL HIGH (ref 0–99)
Triglycerides: 106 mg/dL (ref 0–149)
VLDL CHOLESTEROL CAL: 21 mg/dL (ref 5–40)

## 2018-07-11 LAB — VITAMIN D 25 HYDROXY (VIT D DEFICIENCY, FRACTURES): VIT D 25 HYDROXY: 37.1 ng/mL (ref 30.0–100.0)

## 2018-07-15 DIAGNOSIS — L2084 Intrinsic (allergic) eczema: Secondary | ICD-10-CM | POA: Diagnosis not present

## 2018-07-15 DIAGNOSIS — L821 Other seborrheic keratosis: Secondary | ICD-10-CM | POA: Diagnosis not present

## 2018-07-15 DIAGNOSIS — L570 Actinic keratosis: Secondary | ICD-10-CM | POA: Diagnosis not present

## 2018-07-15 DIAGNOSIS — D1801 Hemangioma of skin and subcutaneous tissue: Secondary | ICD-10-CM | POA: Diagnosis not present

## 2018-07-22 ENCOUNTER — Other Ambulatory Visit: Payer: Self-pay | Admitting: Family Medicine

## 2018-07-29 ENCOUNTER — Other Ambulatory Visit: Payer: Self-pay | Admitting: Family Medicine

## 2018-08-04 ENCOUNTER — Other Ambulatory Visit: Payer: Self-pay | Admitting: Family Medicine

## 2018-08-05 ENCOUNTER — Encounter: Payer: Self-pay | Admitting: Gastroenterology

## 2018-08-18 ENCOUNTER — Other Ambulatory Visit: Payer: Self-pay | Admitting: Family Medicine

## 2018-09-04 DIAGNOSIS — L820 Inflamed seborrheic keratosis: Secondary | ICD-10-CM | POA: Diagnosis not present

## 2018-09-04 DIAGNOSIS — B078 Other viral warts: Secondary | ICD-10-CM | POA: Diagnosis not present

## 2018-09-30 ENCOUNTER — Other Ambulatory Visit: Payer: Self-pay | Admitting: Family Medicine

## 2018-10-10 DIAGNOSIS — M79676 Pain in unspecified toe(s): Secondary | ICD-10-CM | POA: Diagnosis not present

## 2018-10-10 DIAGNOSIS — B351 Tinea unguium: Secondary | ICD-10-CM | POA: Diagnosis not present

## 2018-10-14 ENCOUNTER — Ambulatory Visit: Payer: Medicare Other | Admitting: Adult Health

## 2018-10-14 ENCOUNTER — Encounter: Payer: Self-pay | Admitting: Adult Health

## 2018-10-14 VITALS — BP 131/85 | HR 75 | Ht 67.0 in | Wt 211.4 lb

## 2018-10-14 DIAGNOSIS — R413 Other amnesia: Secondary | ICD-10-CM | POA: Diagnosis not present

## 2018-10-14 MED ORDER — MEMANTINE HCL 5 MG PO TABS: 5.0000 mg | ORAL_TABLET | Freq: Two times a day (BID) | ORAL | 3 refills | Status: DC

## 2018-10-14 NOTE — Progress Notes (Signed)
I have read the note, and I agree with the clinical assessment and plan.  Ricky [name]   

## 2018-10-14 NOTE — Patient Instructions (Signed)
Your Plan:  Continue Namenda- take 5 mg twice a day Home health referral to help with medication administration Monitor driving If your symptoms worsen or you develop new symptoms please let us know.   Thank you for coming to see [place] at [facility]. I hope we have been able to provide you high quality care today.  You may receive a patient satisfaction survey over the next few weeks. We would appreciate your feedback and comments so that we may continue to improve ourselves and the health of our patients.

## 2018-10-14 NOTE — Progress Notes (Signed)
PATIENT: Ricky [name] DOB: 1941/07/16  REASON FOR VISIT: follow up HISTORY FROM: patient  HISTORY OF PRESENT ILLNESS: Today 10/14/18:  Ricky [name] is a 78 year old male with a history of memory disturbance.  He returns today for follow-up.  He states that he has noticed some changes with his memory.  He continues to live at home with his wife.  He is able to complete all ADLs independently.  He states that he does operate a motor vehicle but he has gotten lost while driving.  He states most the time he is able to correct this.  He continues to manage his own finances but he does note that he has some trouble.  He is concerned about money issues once he has passed.  Denies any issues with appetite.  He reports difficulty managing his medications.  Despite living with his wife he reports that she does not want help with his medication.  He is not sure of his medications.  He manages his own appointments.  The patient today had a lot of questions about his finances.  He spent most of the visit asking me financial questions.  I did advise on multiple occasions that this was not my specialty as my advice would only be my personal opinions.  He returns today for follow-up.  HISTORY 03/25/18:  Ricky [name] is a 78 year old male with a history of memory disturbance.  He returns today for follow-up.  He feels that his memory may be slightly worse.  He continues to live at home with his wife.  He is able to complete all ADLs independently.  His son manages his finances for him.  He does not prepare any meals.  He states that he can do simple things such as make a peanut butter and jelly sandwich.  He denies any trouble sleeping.  He reports that he takes frequent naps throughout the day.  He reports that he manages his own medications.  He reports trouble managing his appointment time.  Denies any changes in his mood or behavior.  He returns today for evaluation.  REVIEW OF SYSTEMS: Out of a complete  14 system review of symptoms, the patient complains only of the following symptoms, and all other reviewed systems are negative.  Drooling, leg pain, back pain, headache, dizziness, memory loss, difficulty urinating, cold intolerance  ALLERGIES: No Known Allergies  HOME MEDICATIONS: Outpatient Medications Prior to Visit  Medication Sig Dispense Refill  . amLODipine (NORVASC) 10 MG tablet TAKE 1 TABLET BY MOUTH EVERY DAY 90 tablet 0  . Cholecalciferol (VITAMIN D PO) Take 1 capsule by mouth daily.     Marland Kitchen donepezil (ARICEPT) 10 MG tablet TAKE 1 TABLET(10 MG) BY MOUTH AT BEDTIME 90 tablet 0  . doxazosin (CARDURA) 2 MG tablet TAKE 1 TABLET BY MOUTH EVERY DAY 90 tablet 0  . FIBER FORMULA PO Take 3 capsules by mouth 2 (two) times daily.    . fluticasone (FLONASE) 50 MCG/ACT nasal spray One to 2 sprays each nostril at bedtime and stay on this medication for that at least the next 4-6 weeks (Patient taking differently: One to 2 sprays each nostril at bedtime prn) 16 g 6  . losartan (COZAAR) 100 MG tablet TAKE 1 TABLET BY MOUTH EVERY DAY 90 tablet 0  . memantine (NAMENDA) 5 MG tablet Take 1 tablet (5 mg total) by mouth 2 (two) times daily. 180 tablet 3  . naproxen (NAPROSYN) 500 MG tablet Take 1 tablet (500 mg total)  by mouth 2 (two) times daily with a meal. 30 tablet 0  . potassium chloride SA (K-DUR,KLOR-CON) 20 MEQ tablet TAKE 1 TABLET BY MOUTH THREE TIMES DAILY 270 tablet 1  . tamsulosin (FLOMAX) 0.4 MG CAPS capsule TAKE ONE CAPSULE BY MOUTH TWICE DAILY 180 capsule 0  . tolterodine (DETROL LA) 4 MG 24 hr capsule TK ONE C PO QHS  10   No facility-administered medications prior to visit.     PAST MEDICAL HISTORY: Past Medical History:  Diagnosis Date  . Chronic cough   . Deviated nasal septum   . 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 Family history of prostate cancer   . GERD (gastroesophageal reflux disease)   . History of adenomatous polyp of colon    2008; 2011    . History of exercise stress test    01-14-2009 by dr hochrein-- normal w/ no ischemia  . Hot flashes    DUE TO HORMONE THERAPY FOR PROSTATE CANCER  . Hyperlipidemia   . Hypertension   . Mild memory disturbance    per dr Jannifer Franklin note (neurologist)  . Nocturia more than twice per night   . Nocturnal leg cramps 03/23/2017  . OA (osteoarthritis)   . OSA (obstructive sleep apnea)    moderate osa per study 02-12-2017  cpap recommended--- per pt never heard back from doctor for obtaining cpap  . Pneumonia 02/2018  . Prostate cancer [facility]) urologist-  dr ottelin/  oncologist-  dr Tammi Klippel   dx 10-10-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, Gleason 3+4, PSA 9.02, vol 78cc started ADT to decrease size;  08/ 2018 prostate decrease size vol 46cc and PSA 0.36--- scheduled for radiative prostate seed implants    PAST SURGICAL HISTORY: Past Surgical History:  Procedure Laterality Date  . CATARACT EXTRACTION W/ INTRAOCULAR LENS  IMPLANT, BILATERAL  2016  . COLONOSCOPY  last one 08-08-2013  . CYSTOSCOPY N/A 08/10/2017   Procedure: CYSTOSCOPY FLEXIBLE;  Surgeon: Kathie Rhodes, MD;  Location: [facility];  Service: Urology;  Laterality: N/A;  NO SEEDS FOUND IN BLADDER  . HAND SURGERY Left 1992  . KNEE ARTHROSCOPY Bilateral 1960's and 70's  . PROSTATE BIOPSY  10-10-2016   dr Karsten Ro office  . RADIOACTIVE SEED IMPLANT N/A 08/10/2017   Procedure: RADIOACTIVE SEED IMPLANT/BRACHYTHERAPY IMPLANT;  Surgeon: Kathie Rhodes, MD;  Location: [facility];  Service: Urology;  Laterality: N/A;    64   SEEDS IMPLANTED  . ROTATOR CUFF REPAIR Right 2004  . SPACE OAR INSTILLATION N/A 08/10/2017   Procedure: SPACE OAR INSTILLATION;  Surgeon: Kathie Rhodes, MD;  Location: [facility];  Service: Urology;  Laterality: N/A;  . TOE SURGERY Left 1970   little toe  . TOTAL KNEE ARTHROPLASTY Bilateral 01-25-2009   dr Wynelle Link [facility]    FAMILY HISTORY: Family History  Problem Relation Age of Onset  . Healthy  Mother   . Prostate cancer Father        Dx 71s; Deceased 72  . Prostate cancer Brother 39       currently 27  . Alcohol abuse Brother   . Bipolar disorder Brother   . Prostate cancer Paternal Uncle        unsure of age; possibly 2nd uncle also has prostate ca  . Prostate cancer Maternal Uncle        unsure of age  . Breast cancer Maternal Aunt        unsure of age  . Colon  cancer Neg Hx   . Esophageal cancer Neg Hx   . Rectal cancer Neg Hx   . Stomach cancer Neg Hx     SOCIAL HISTORY: Social History   Socioeconomic History  . Marital status: Married    Spouse name: [name]  . Number of children: 3  . Years of education: BA  . Highest education level: Not on file  Occupational History  . Occupation: Retired  Scientific laboratory technician  . Financial resource strain: Not on file  . Food insecurity:    Worry: Not on file    Inability: Not on file  . Transportation needs:    Medical: Not on file    Non-medical: Not on file  Tobacco Use  . Smoking status: Never Smoker  . Smokeless tobacco: Never Used  Substance and Sexual Activity  . Alcohol use: Yes    Comment: rare  . Drug use: No  . Sexual activity: Yes    Partners: Female    Comment: vasectomy  Lifestyle  . Physical activity:    Days per week: Not on file    Minutes per session: Not on file  . Stress: Not on file  Relationships  . Social connections:    Talks on phone: Not on file    Gets together: Not on file    Attends religious service: Not on file    Active member of club or organization: Not on file    Attends meetings of clubs or organizations: Not on file    Relationship status: Not on file  . Intimate partner violence:    Fear of current or ex partner: Not on file    Emotionally abused: Not on file    Physically abused: Not on file    Forced sexual activity: Not on file  Other Topics Concern  . Not on file  Social History Narrative   Lives at home w/ his wife   Right-handed   Caffeine: none       PHYSICAL EXAM  Vitals:   10/14/18 0843  BP: 131/85  Pulse: 75  Weight: 211 lb 6.4 oz (95.9 kg)  Height: 5\' 7"  (1.702 m)   Body mass index is 33.11 kg/m.   MMSE - Mini Mental State Exam 10/14/2018 03/25/2018 03/23/2017  Not completed: (No Data) - -  Orientation to time 5 4 3   Orientation to Place 5 5 5   Registration 3 3 3   Attention/ Calculation 1 4 5   Recall 3 3 3   Language- name 2 objects 2 2 2   Language- repeat 1 1 1   Language- follow 3 step command 3 2 3   Language- read & follow direction 1 1 1   Write a sentence 1 0 1  Copy design 1 1 1   Total score 26 26 28      Generalized: Well developed, in no acute distress   Neurological examination  Mentation: Alert oriented to time, place, history taking. Follows all commands speech and language fluent Cranial nerve II-XII: Pupils were equal round reactive to light. Extraocular movements were full, visual field were full on confrontational test. Facial sensation and strength were normal. Uvula tongue midline. Head turning and shoulder shrug  were normal and symmetric. Motor: The motor testing reveals 5 over 5 strength of all 4 extremities. Good symmetric motor tone is noted throughout.  Sensory: Sensory testing is intact to soft touch on all 4 extremities. No evidence of extinction is noted.  Coordination: Cerebellar testing reveals good finger-nose-finger and heel-to-shin bilaterally.  Gait and station:  Gait is normal. Reflexes: Deep tendon reflexes are symmetric and normal bilaterally.   DIAGNOSTIC DATA (LABS, IMAGING, TESTING) - I reviewed patient records, labs, notes, testing and imaging myself where available.  Lab Results  Component Value Date   WBC 4.7 07/10/2018   HGB 14.1 07/10/2018   HCT 41.6 07/10/2018   MCV 84 07/10/2018   PLT 206 07/10/2018      Component Value Date/Time   NA 142 07/10/2018 1553   K 3.6 07/10/2018 1553   CL 103 07/10/2018 1553   CO2 25 07/10/2018 1553   GLUCOSE 84 07/10/2018 1553    GLUCOSE 88 08/03/2017 0936   BUN 13 07/10/2018 1553   CREATININE 1.09 07/10/2018 1553   CALCIUM 9.9 07/10/2018 1553   PROT 6.4 07/10/2018 1553   ALBUMIN 4.6 07/10/2018 1553   AST 22 07/10/2018 1553   ALT 21 07/10/2018 1553   ALKPHOS 94 07/10/2018 1553   BILITOT 0.6 07/10/2018 1553   GFRNONAA 65 07/10/2018 1553   GFRAA 75 07/10/2018 1553   Lab Results  Component Value Date   CHOL 182 07/10/2018   HDL 53 07/10/2018   LDLCALC 108 (H) 07/10/2018   TRIG 106 07/10/2018   CHOLHDL 3.4 07/10/2018   No results found for: HGBA1C Lab Results  Component Value Date   VITAMINB12 450 08/10/2016   Lab Results  Component Value Date   TSH 1.220 04/04/2018      ASSESSMENT AND PLAN 78 y.o. year old male  has a past medical history of Chronic cough, Deviated nasal septum, Enlarged prostate with lower urinary tract symptoms (LUTS), Family history of prostate cancer, GERD (gastroesophageal reflux disease), History of adenomatous polyp of colon, History of exercise stress test, Hot flashes, Hyperlipidemia, Hypertension, Mild memory disturbance, Nocturia more than twice per night, Nocturnal leg cramps (03/23/2017), OA (osteoarthritis), OSA (obstructive sleep apnea), Pneumonia (02/2018), and Prostate cancer [facility]) (urologist-  dr ottelin/  oncologist-  dr Tammi Klippel). here with:  1.  Memory disturbance  The patient requires remain relatively stable.  The patient is unsure what dose of Namenda he is taking.  I have advised that since he does not have a family member that can assist him with his medication I will make referral to home health to have a nurse come out to assist him with organizing his medications.  We will continue to monitor the memory.  He cannot tolerate higher doses of Namenda due to dizziness.  The patient also reported several other symptoms that was listed in the review of symptoms.  I advised that these should be discussed with his primary care provider first.  He voiced understanding.   He will follow-up in 6 months or sooner if needed.  I spent 15 minutes with the patient. 50% of this time was spent reviewing plan of care.   Ward Givens, MSN, NP-C 10/14/2018, 9:14 AM [facility] [address] [place], [place] [address] [phone]

## 2018-10-22 ENCOUNTER — Telehealth: Payer: Self-pay | Admitting: Neurology

## 2018-10-22 NOTE — Telephone Encounter (Signed)
Events noted, the patient was seen by NP in early January, needed help organize medications to keep up with him.  Home health nursing was ordered.

## 2018-10-22 NOTE — Telephone Encounter (Signed)
Kecia from [facility] called to inform us that they have been unsuccessful at reaching the pt. However they will go out on the 17th if he does respond.

## 2018-11-13 ENCOUNTER — Ambulatory Visit (INDEPENDENT_AMBULATORY_CARE_PROVIDER_SITE_OTHER): Payer: Medicare Other | Admitting: Family Medicine

## 2018-11-13 ENCOUNTER — Encounter: Payer: Self-pay | Admitting: Family Medicine

## 2018-11-13 VITALS — BP 155/82 | HR 69 | Temp 97.3°F | Ht 67.0 in | Wt 214.0 lb

## 2018-11-13 DIAGNOSIS — E559 Vitamin D deficiency, unspecified: Secondary | ICD-10-CM

## 2018-11-13 DIAGNOSIS — Z1211 Encounter for screening for malignant neoplasm of colon: Secondary | ICD-10-CM

## 2018-11-13 DIAGNOSIS — E785 Hyperlipidemia, unspecified: Secondary | ICD-10-CM

## 2018-11-13 DIAGNOSIS — C61 Malignant neoplasm of prostate: Secondary | ICD-10-CM | POA: Diagnosis not present

## 2018-11-13 DIAGNOSIS — I10 Essential (primary) hypertension: Secondary | ICD-10-CM

## 2018-11-13 DIAGNOSIS — R413 Other amnesia: Secondary | ICD-10-CM

## 2018-11-13 DIAGNOSIS — N400 Enlarged prostate without lower urinary tract symptoms: Secondary | ICD-10-CM

## 2018-11-13 NOTE — Progress Notes (Signed)
Subjective:    Patient ID: Ricky [name], male    DOB: 10/27/1940, 78 y.o.   MRN: [id number]  HPI Pt here for follow up and management of chronic medical problems which includes hyperlipidemia and hypertension. He is taking medication regularly.  Ricky [name] is here today with no specific complaints.  His last colonoscopy was in October 2014 and he was due to have this repeated in October 2019.  We will make sure he gets a follow-up visit scheduled for this.  His blood pressure readings on 2 occasions today were both elevated.  He sees the urologist regularly because of prostate cancer and he does his rectal exams.  He is due to return in FOBT.  Will get lab work today.  Will make sure that he gets an appointment with the gastroenterologist for a follow-up colonoscopy.  Patient has also seen the nurse practitioner that works with Dr. Jannifer [name] and has a memory disturbance.  They plan to follow-up with him sometime early summer.  He is supposed to be taking Aricept and Namenda.  The patient today indicated he has some dementia and has trouble remembering things.  He does have an appoint with a neurologist again in early summer.  He sees the urologist tomorrow and of course had radiation or seed implants for his prostate cancer.  Somehow he has not gotten in touch with the gastroenterologist yet and we will re-refer him to the gastroenterologist for a colonoscopy as this is past due.  The patient today denies any chest pain or shortness of breath.  He says his bowel habits are stable with no blood in the stool or black tarry bowel movements.  He is passing his water well currently and is seeing the urologist again tomorrow.  He is taking amlodipine losartan and doxazosin all of which can help with his blood pressure.  He says he is taking these correctly.  He is also on some fiber formula and that is 3 capsules by mouth 2 times daily.    Patient Active Problem List   Diagnosis Date Noted  . Sleep apnea  in adult 04/08/2017  . Nocturnal leg cramps 03/23/2017  . Genetic testing 12/29/2016  . Family history of breast cancer 12/13/2016  . Family history of prostate cancer   . Malignant neoplasm prostate ([place]) 11/13/2016  . Memory difficulty 09/22/2016  . Headache syndrome 09/22/2016  . Cough variant asthma vs UACS  11/12/2015  . Hyperlipidemia 10/15/2015  . Vitamin D deficiency 03/12/2014  . BPH (benign prostatic hyperplasia) 10/01/2013  . Erectile dysfunction 10/01/2013  . PERSONAL HX COLONIC POLYPS 01/25/2010  . 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 Essential hypertension 01/13/2009   Outpatient Encounter Medications as of 11/13/2018  Medication Sig  . amLODipine (NORVASC) 10 MG tablet TAKE 1 TABLET BY MOUTH EVERY DAY  . Cholecalciferol (VITAMIN D PO) Take 1 capsule by mouth daily.   Marland Kitchen donepezil (ARICEPT) 10 MG tablet TAKE 1 TABLET(10 MG) BY MOUTH AT BEDTIME  . doxazosin (CARDURA) 2 MG tablet TAKE 1 TABLET BY MOUTH EVERY DAY  . FIBER FORMULA PO Take 3 capsules by mouth 2 (two) times daily.  . fluticasone (FLONASE) 50 MCG/ACT nasal spray One to 2 sprays each nostril at bedtime and stay on this medication for that at least the next 4-6 weeks (Patient taking differently: One to 2 sprays each nostril at bedtime prn)  . losartan (COZAAR) 100 MG tablet TAKE 1 TABLET BY MOUTH EVERY DAY  . memantine (NAMENDA)  5 MG tablet Take 1 tablet (5 mg total) by mouth 2 (two) times daily.  . naproxen (NAPROSYN) 500 MG tablet Take 1 tablet (500 mg total) by mouth 2 (two) times daily with a meal.  . potassium chloride SA (K-DUR,KLOR-CON) 20 MEQ tablet TAKE 1 TABLET BY MOUTH THREE TIMES DAILY  . tamsulosin (FLOMAX) 0.4 MG CAPS capsule TAKE ONE CAPSULE BY MOUTH TWICE DAILY  . tolterodine (DETROL LA) 4 MG 24 hr capsule TK ONE C PO QHS   No facility-administered encounter medications on file as of 11/13/2018.      Review of Systems  Constitutional: Negative.   HENT: Negative.   Eyes: Negative.     Respiratory: Negative.   Cardiovascular: Negative.   Gastrointestinal: Negative.   Endocrine: Negative.   Genitourinary: Negative.   Musculoskeletal: Negative.   Skin: Negative.   Allergic/Immunologic: Negative.   Neurological: Negative.   Hematological: Negative.   Psychiatric/Behavioral: Negative.        Objective:   Physical Exam Vitals signs and nursing note reviewed.  Constitutional:      General: He is not in acute distress.    Appearance: Normal appearance. He is well-developed. He is obese. He is not ill-appearing.     Comments: Patient's memory is declining and he admits that he has dementia.  HENT:     Head: Normocephalic and atraumatic.     Right Ear: Tympanic membrane, ear canal and external ear normal. There is no impacted cerumen.     Left Ear: Tympanic membrane, ear canal and external ear normal. There is no impacted cerumen.     Nose: Congestion present. No rhinorrhea.     Mouth/Throat:     Mouth: Mucous membranes are moist.     Pharynx: Oropharynx is clear. No oropharyngeal exudate.  Eyes:     General: No scleral icterus.       Right eye: No discharge.        Left eye: No discharge.     Extraocular Movements: Extraocular movements intact.     Conjunctiva/sclera: Conjunctivae normal.     Pupils: Pupils are equal, round, and reactive to light.  Neck:     Musculoskeletal: Normal range of motion and neck supple.     Thyroid: No thyromegaly.     Vascular: No carotid bruit.     Trachea: No tracheal deviation.     Comments: No bruits thyromegaly or anterior cervical adenopathy Cardiovascular:     Rate and Rhythm: Normal rate. Rhythm irregular.     Heart sounds: Normal heart sounds. No murmur.     Comments: The heart is slightly irregular at 72/min there is no edema and he has good pedal pulses. Pulmonary:     Effort: Pulmonary effort is normal.     Breath sounds: Normal breath sounds. No wheezing or rales.     Comments: Breath sounds are clear anteriorly  and posteriorly with no axillary adenopathy or chest wall masses Chest:     Chest wall: No tenderness.  Abdominal:     General: Bowel sounds are normal.     Palpations: Abdomen is soft. There is no mass.     Tenderness: There is no abdominal tenderness.     Comments: No masses tenderness or organ enlargement or bruits  Genitourinary:    Comments: Patient is followed by urologist regularly and in fact has an appointment tomorrow.  He is status post seed implants for prostate cancer Musculoskeletal: Normal range of motion.  General: No tenderness.     Right lower leg: No edema.     Left lower leg: No edema.  Lymphadenopathy:     Cervical: No cervical adenopathy.  Skin:    General: Skin is warm and dry.     Findings: No rash.  Neurological:     General: No focal deficit present.     Mental Status: He is oriented to person, place, and time. Mental status is at baseline.     Cranial Nerves: No cranial nerve deficit.     Gait: Gait normal.     Deep Tendon Reflexes: Reflexes are normal and symmetric. Reflexes normal.     Comments: Patient does have memory impairment and has trouble recalling the names of doctors and time sequences.  Psychiatric:        Mood and Affect: Mood normal.        Behavior: Behavior normal.        Thought Content: Thought content normal.        Judgment: Judgment normal.     Comments: Mood is calm and the patient does not seem depressed or anxious regarding his memory impairment.    BP (!) 155/93 (BP Location: Left Arm)   Pulse 69   Temp (!) 97.3 F (36.3 C) (Oral)   Ht '5\' 7"'  (1.702 m)   Wt 214 lb (97.1 kg)   BMI 33.52 kg/m         Assessment & Plan:  1. Vitamin D deficiency -Continue vitamin D replacement - CBC with Differential/Platelet - VITAMIN D 25 Hydroxy (Vit-D Deficiency, Fractures)  2. Prostate cancer [facility]) -Follow-up with urology as planned - CBC with Differential/Platelet  3. Hyperlipidemia with target LDL less than  100 -Continue with good cholesterol management with diet and exercise - CBC with Differential/Platelet - Lipid panel  4. Benign prostatic hyperplasia, unspecified whether lower urinary tract symptoms present -Patient has had prostate cancer and has had seed implants.  He has a follow-up appoint with Dr. Karsten Ro tomorrow. - CBC with Differential/Platelet  5. Essential hypertension -The blood pressure was elevated today.  It was elevated on 2 occasions.  The patient will return to the office with his wife and bring home readings for review along with discussing with the pharmacist any polypharmacy issues that may be causing him to be more tired and fatigued and sleeping more. - CBC with Differential/Platelet - BMP8+EGFR - Hepatic function panel  6. Memory impairment -Continue to follow-up with Dr. Jannifer [name] and his nurse practitioner as planned - CBC with Differential/Platelet  7. Screen for colon cancer -Dr. Sharlett Iles last did the patient's colonoscopy in 2014 and recommended a repeat colonoscopy 5 years later and we are slightly past due for this.  We will go ahead and try to schedule this for him and will let his wife know about this.  Dr. Boykin Reaper has retired. - Ambulatory referral to Gastroenterology  Patient Instructions                       Medicare Annual Wellness Visit  [facility] and the medical providers at [facility] strive to bring you the best medical care.  In doing so we not only want to address your current medical conditions and concerns but also to detect new conditions early and prevent illness, disease and health-related problems.    Medicare offers a yearly Wellness Visit which allows our clinical staff to assess your need for preventative services including immunizations, lifestyle education,  counseling to decrease risk of preventable diseases and screening for fall risk and other medical concerns.    This visit is provided free of charge (no  copay) for all Medicare recipients. The clinical pharmacists at [facility] have begun to conduct these Wellness Visits which will also include a thorough review of all your medications.    As you primary medical provider recommend that you make an appointment for your Annual Wellness Visit if you have not done so already this year.  You may set up this appointment before you leave today or you may call back ([phone]) and schedule an appointment.  Please make sure when you call that you mention that you are scheduling your Annual Wellness Visit with the clinical pharmacist so that the appointment may be made for the proper length of time.    Continue current medications. Continue good therapeutic lifestyle changes which include good diet and exercise. Fall precautions discussed with patient. If an FOBT was given today- please return it to our front desk. If you are over 73 years old - you may need Prevnar 45 or the adult Pneumonia vaccine.  **Flu shots are available--- please call and schedule a FLU-CLINIC appointment**  After your visit with [place] today you will receive a survey in the mail or online from [employer] regarding your care with [place]. Please take a moment to fill this out. Your feedback is very important to [place] as you can help [place] better understand your patient needs as well as improve your experience and satisfaction. WE CARE ABOUT YOU!!!   Please bring all medications and home blood pressure readings to visit with clinical pharmacist in a couple of weeks and she can look and make sure there is no polypharmacy issues with the medicines that the patient is currently taking.  Especially issues causing him to be more sleepy and out of it. Follow-up with urology tomorrow Watch sodium intake We are planning to make an appointment for the patient to see the gastroenterologist because he is past due on getting his colonoscopy done.  If you do not hear from [place] or from them  within the next couple weeks please let us know Do not forget to follow-up with a neurologist this summer, Dr. Jannifer [name] or his nurse practitioner  Arrie Senate MD

## 2018-11-13 NOTE — Patient Instructions (Addendum)
Medicare Annual Wellness Visit  [facility] and the medical providers at [facility] strive to bring you the best medical care.  In doing so we not only want to address your current medical conditions and concerns but also to detect new conditions early and prevent illness, disease and health-related problems.    Medicare offers a yearly Wellness Visit which allows our clinical staff to assess your need for preventative services including immunizations, lifestyle education, counseling to decrease risk of preventable diseases and screening for fall risk and other medical concerns.    This visit is provided free of charge (no copay) for all Medicare recipients. The clinical pharmacists at [facility] have begun to conduct these Wellness Visits which will also include a thorough review of all your medications.    As you primary medical provider recommend that you make an appointment for your Annual Wellness Visit if you have not done so already this year.  You may set up this appointment before you leave today or you may call back ([phone]) and schedule an appointment.  Please make sure when you call that you mention that you are scheduling your Annual Wellness Visit with the clinical pharmacist so that the appointment may be made for the proper length of time.    Continue current medications. Continue good therapeutic lifestyle changes which include good diet and exercise. Fall precautions discussed with patient. If an FOBT was given today- please return it to our front desk. If you are over 35 years old - you may need Prevnar 78 or the adult Pneumonia vaccine.  **Flu shots are available--- please call and schedule a FLU-CLINIC appointment**  After your visit with [place] today you will receive a survey in the mail or online from [employer] regarding your care with [place]. Please take a moment to fill this out. Your feedback is very  important to [place] as you can help [place] better understand your patient needs as well as improve your experience and satisfaction. WE CARE ABOUT YOU!!!   Please bring all medications and home blood pressure readings to visit with clinical pharmacist in a couple of weeks and she can look and make sure there is no polypharmacy issues with the medicines that the patient is currently taking.  Especially issues causing him to be more sleepy and out of it. Follow-up with urology tomorrow Watch sodium intake We are planning to make an appointment for the patient to see the gastroenterologist because he is past due on getting his colonoscopy done.  If you do not hear from [place] or from them within the next couple weeks please let us know Do not forget to follow-up with a neurologist this summer, Dr. Jannifer Franklin or his nurse practitioner

## 2018-11-14 ENCOUNTER — Other Ambulatory Visit: Payer: Self-pay | Admitting: Family Medicine

## 2018-11-14 DIAGNOSIS — R3912 Poor urinary stream: Secondary | ICD-10-CM | POA: Diagnosis not present

## 2018-11-14 DIAGNOSIS — Z8546 Personal history of malignant neoplasm of prostate: Secondary | ICD-10-CM | POA: Diagnosis not present

## 2018-11-14 DIAGNOSIS — N401 Enlarged prostate with lower urinary tract symptoms: Secondary | ICD-10-CM | POA: Diagnosis not present

## 2018-11-14 LAB — HEPATIC FUNCTION PANEL
ALT: 19 IU/L (ref 0–44)
AST: 21 IU/L (ref 0–40)
Albumin: 4.2 g/dL (ref 3.7–4.7)
Alkaline Phosphatase: 86 IU/L (ref 39–117)
BILIRUBIN, DIRECT: 0.15 mg/dL (ref 0.00–0.40)
Bilirubin Total: 0.5 mg/dL (ref 0.0–1.2)
Total Protein: 5.9 g/dL — ABNORMAL LOW (ref 6.0–8.5)

## 2018-11-14 LAB — CBC WITH DIFFERENTIAL/PLATELET
BASOS: 0 %
Basophils Absolute: 0 10*3/uL (ref 0.0–0.2)
EOS (ABSOLUTE): 0.1 10*3/uL (ref 0.0–0.4)
Eos: 2 %
Hematocrit: 42 % (ref 37.5–51.0)
Hemoglobin: 14.5 g/dL (ref 13.0–17.7)
IMMATURE GRANS (ABS): 0 10*3/uL (ref 0.0–0.1)
IMMATURE GRANULOCYTES: 0 %
Lymphocytes Absolute: 1 10*3/uL (ref 0.7–3.1)
Lymphs: 26 %
MCH: 28.5 pg (ref 26.6–33.0)
MCHC: 34.5 g/dL (ref 31.5–35.7)
MCV: 83 fL (ref 79–97)
Monocytes Absolute: 0.5 10*3/uL (ref 0.1–0.9)
Monocytes: 12 %
Neutrophils Absolute: 2.3 10*3/uL (ref 1.4–7.0)
Neutrophils: 60 %
Platelets: 194 10*3/uL (ref 150–450)
RBC: 5.09 x10E6/uL (ref 4.14–5.80)
RDW: 13.5 % (ref 11.6–15.4)
WBC: 3.9 10*3/uL (ref 3.4–10.8)

## 2018-11-14 LAB — BMP8+EGFR
BUN/Creatinine Ratio: 10 (ref 10–24)
BUN: 11 mg/dL (ref 8–27)
CO2: 24 mmol/L (ref 20–29)
Calcium: 9.4 mg/dL (ref 8.6–10.2)
Chloride: 102 mmol/L (ref 96–106)
Creatinine, Ser: 1.1 mg/dL (ref 0.76–1.27)
GFR calc Af Amer: 74 mL/min/{1.73_m2} (ref >59)
GFR calc non Af Amer: 64 mL/min/{1.73_m2} (ref >59)
Glucose: 89 mg/dL (ref 65–99)
Potassium: 3.2 mmol/L — ABNORMAL LOW (ref 3.5–5.2)
Sodium: 144 mmol/L (ref 134–144)

## 2018-11-14 LAB — VITAMIN D 25 HYDROXY (VIT D DEFICIENCY, FRACTURES): VIT D 25 HYDROXY: 39.6 ng/mL (ref 30.0–100.0)

## 2018-11-14 LAB — LIPID PANEL
Chol/HDL Ratio: 3.9 ratio (ref 0.0–5.0)
Cholesterol, Total: 174 mg/dL (ref 100–199)
HDL: 45 mg/dL (ref >39)
LDL Calculated: 106 mg/dL — ABNORMAL HIGH (ref 0–99)
Triglycerides: 115 mg/dL (ref 0–149)
VLDL Cholesterol Cal: 23 mg/dL (ref 5–40)

## 2018-11-18 ENCOUNTER — Other Ambulatory Visit: Payer: Medicare Other

## 2018-11-18 DIAGNOSIS — Z1211 Encounter for screening for malignant neoplasm of colon: Secondary | ICD-10-CM | POA: Diagnosis not present

## 2018-11-19 LAB — FECAL OCCULT BLOOD, IMMUNOCHEMICAL: FECAL OCCULT BLD: POSITIVE — AB

## 2018-11-20 ENCOUNTER — Encounter: Payer: Self-pay | Admitting: Gastroenterology

## 2018-11-20 ENCOUNTER — Ambulatory Visit: Payer: Medicare Other | Admitting: Pharmacist Clinician (PhC)/ Clinical Pharmacy Specialist

## 2018-11-20 ENCOUNTER — Telehealth: Payer: Self-pay | Admitting: Pharmacist Clinician (PhC)/ Clinical Pharmacy Specialist

## 2018-11-20 NOTE — Telephone Encounter (Signed)
Called patient and spoke with his wife for 20 minutes.  He has been taking potassium Chloride 58mEq tid and since his potassium was normal until the last check (3.2) per Dr. Laurance Flatten will re-check it again on his next visit.  Patient has increased fatigue and drowsiness per wife.  [place] was started last October.  He also began to have increased BP readings around that time that have persisted even after cardura 2mg  was added at his last PCP OV.  Cardura is on the BEERS list of medications to avoid in the elderly due to risk for orthostatic hypotension.  His home BP readings since starting it have been aroud 143-150/84.  He is already taking amlodipine 10mg  and losartan 100mg .  Namenda can increase BP and the incidence although not high is double that seen with the placebo arm.    Patient saw urologist last week and was told he can stop flomax, so he will no longer be on this medication.  Namenda and Detrol combined can be contributing to his drowsiness and Aricept can cause fatigue.    Plan:   Stop Flomax per urologist  Stop Aricept  Check BP reading at home daily   Call in 1 week for BP readings  When flu has subsided in office patient will come in for an appointment and potassium check.  Look at stopping cardura in 1 week when I call for BP readings

## 2018-11-21 ENCOUNTER — Telehealth: Payer: Self-pay | Admitting: *Deleted

## 2018-11-21 NOTE — Telephone Encounter (Signed)
LM for wife to call back to discuss referral to GI for colonoscopy due to positive FOBT. Referral was closed due to patient telling GI he did not want referral but Dr. Laurance Flatten would like [place] to discuss with wife due to patient having dementia

## 2018-11-22 NOTE — Telephone Encounter (Signed)
Spoke with Pt Wife they have a consultation scheduled with Dr. Simona Huh next Thursday at [facility] @ 11am. You need moore information. Please call wife back.

## 2018-11-22 NOTE — Telephone Encounter (Signed)
Patient has appointment on 02/20 with GI

## 2018-11-27 ENCOUNTER — Telehealth: Payer: Self-pay | Admitting: Pharmacist Clinician (PhC)/ Clinical Pharmacy Specialist

## 2018-11-27 NOTE — Telephone Encounter (Signed)
Talked with Ricky [name] today about how her husband is doing with medication changes made last week.  She sees some improvement already in his energy levels.  Home BP readings ranged from 128/77 yesterday to 150/82.  I still would like to see him off cardura based on his age, however will need to see potasium results next week before changing from Cozaar to Hyzaar.

## 2018-11-28 ENCOUNTER — Encounter: Payer: Self-pay | Admitting: Gastroenterology

## 2018-11-28 ENCOUNTER — Ambulatory Visit: Payer: Medicare Other | Admitting: Gastroenterology

## 2018-11-28 VITALS — BP 142/90 | HR 78 | Ht 67.0 in | Wt 211.0 lb

## 2018-11-28 DIAGNOSIS — R195 Other fecal abnormalities: Secondary | ICD-10-CM

## 2018-11-28 DIAGNOSIS — K5909 Other constipation: Secondary | ICD-10-CM

## 2018-11-28 MED ORDER — NA SULFATE-K SULFATE-MG SULF 17.5-3.13-1.6 GM/177ML PO SOLN: 1.0000 | Freq: Once | ORAL | 0 refills | Status: AC

## 2018-11-28 NOTE — Patient Instructions (Signed)
If you are age 78 or older, your body mass index should be between 23-30. Your Body mass index is 33.05 kg/m. If this is out of the aforementioned range listed, please consider follow up with your Primary Care Provider.  If you are age 42 or younger, your body mass index should be between 19-25. Your Body mass index is 33.05 kg/m. If this is out of the aformentioned range listed, please consider follow up with your Primary Care Provider.   You have been scheduled for a colonoscopy. Please follow written instructions given to you at your visit today.  Please pick up your prep supplies at the pharmacy within the next 1-3 days. If you use inhalers (even only as needed), please bring them with you on the day of your procedure. Your physician has requested that you go to www.startemmi.com and enter the access code given to you at your visit today. This web site gives a general overview about your procedure. However, you should still follow specific instructions given to you by our office regarding your preparation for the procedure.  It was a pleasure to see you today!  Dr. Loletha Carrow

## 2018-11-28 NOTE — Progress Notes (Signed)
[facility] Consult Note:  History: Ricky [name] 11/28/2018  Referring physician: Chipper Herb, MD  Reason for consult/chief complaint: Blood In Stools (positive stool sample. Had one time 2-3 weeks ago where he had to strain but did not notice any blood.  )   Subjective  HPI:  This is a very pleasant 78 year old man referred by primary care for a card positive for occult blood.  He had a colonoscopy with Dr. Sharlett Iles in June 2011 revealing diverticulosis and several diminutive tubular adenomas.  No polyps were seen on October 2014.  Thus, when his name came up for recall several months ago (Dr. Sharlett Iles had recommended a 5-year recall), I felt that based on current guidelines, the patient no further colon cancer screening. He tends toward constipation, there is been no recent change in this.  He takes Metamucil 2 capsules every morning and again at night.  Sometimes the stool still firm with the need to strain. He denies rectal bleeding, abdominal pain, nausea, vomiting, early satiety or weight loss.  ROS:  Review of Systems  Constitutional: Negative for appetite change and unexpected weight change.  HENT: Negative for mouth sores and voice change.   Eyes: Negative for pain and redness.  Respiratory: Negative for cough and shortness of breath.   Cardiovascular: Negative for chest pain and palpitations.  Genitourinary: Negative for dysuria and hematuria.  Musculoskeletal: Negative for arthralgias and myalgias.  Skin: Negative for pallor and rash.  Neurological: Negative for weakness and headaches.       Some difficulty with his memory.  Hematological: Negative for adenopathy.     Past Medical History: Past Medical History:  Diagnosis Date  . Chronic cough   . Deviated nasal septum   . 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 Family history of prostate cancer   . GERD (gastroesophageal reflux disease)   . History  of adenomatous polyp of colon    2008; 2011  . History of exercise stress test    01-14-2009 by dr hochrein-- normal w/ no ischemia  . Hot flashes    DUE TO HORMONE THERAPY FOR PROSTATE CANCER  . Hyperlipidemia   . Hypertension   . Mild memory disturbance    per dr Jannifer Franklin note (neurologist)  . Nocturia more than twice per night   . Nocturnal leg cramps 03/23/2017  . OA (osteoarthritis)   . OSA (obstructive sleep apnea)    moderate osa per study 02-12-2017  cpap recommended--- per pt never heard back from doctor for obtaining cpap  . Pneumonia 02/2018  . Prostate cancer [facility]) urologist-  dr ottelin/  oncologist-  dr Tammi Klippel   dx 10-10-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, Gleason 3+4, PSA 9.02, vol 78cc started ADT to decrease size;  08/ 2018 prostate decrease size vol 46cc and PSA 0.36--- scheduled for radiative prostate seed implants     Past Surgical History: Past Surgical History:  Procedure Laterality Date  . CATARACT EXTRACTION W/ INTRAOCULAR LENS  IMPLANT, BILATERAL  2016  . COLONOSCOPY  last one 08-08-2013  . CYSTOSCOPY N/A 08/10/2017   Procedure: CYSTOSCOPY FLEXIBLE;  Surgeon: Kathie Rhodes, MD;  Location: [facility];  Service: Urology;  Laterality: N/A;  NO SEEDS FOUND IN BLADDER  . HAND SURGERY Left 1992  . KNEE ARTHROSCOPY Bilateral 1960's and 70's  . PROSTATE BIOPSY  10-10-2016   dr Karsten Ro office  . RADIOACTIVE SEED IMPLANT N/A 08/10/2017   Procedure: RADIOACTIVE SEED IMPLANT/BRACHYTHERAPY IMPLANT;  Surgeon:  Kathie Rhodes, MD;  Location: [facility];  Service: Urology;  Laterality: N/A;    64   SEEDS IMPLANTED  . ROTATOR CUFF REPAIR Right 2004  . SPACE OAR INSTILLATION N/A 08/10/2017   Procedure: SPACE OAR INSTILLATION;  Surgeon: Kathie Rhodes, MD;  Location: [facility];  Service: Urology;  Laterality: N/A;  . TOE SURGERY Left 1970   little toe  . TOTAL KNEE ARTHROPLASTY Bilateral 01-25-2009   dr Wynelle Link [facility]     Family History: Family  History  Problem Relation Age of Onset  . Healthy Mother   . Prostate cancer Father        Dx 60s; Deceased 28  . Prostate cancer Brother 24       currently 61  . Alcohol abuse Brother   . Bipolar disorder Brother   . Prostate cancer Paternal Uncle        unsure of age; possibly 2nd uncle also has prostate ca  . Prostate cancer Maternal Uncle        unsure of age  . Breast cancer Maternal Aunt        unsure of age  . Colon cancer Neg Hx   . Esophageal cancer Neg Hx   . Rectal cancer Neg Hx   . Stomach cancer Neg Hx     Social History: Social History   Socioeconomic History  . Marital status: Married    Spouse name: [name]  . Number of children: 3  . Years of education: BA  . Highest education level: Not on file  Occupational History  . Occupation: Retired  Scientific laboratory technician  . Financial resource strain: Not on file  . Food insecurity:    Worry: Not on file    Inability: Not on file  . Transportation needs:    Medical: Not on file    Non-medical: Not on file  Tobacco Use  . Smoking status: Never Smoker  . Smokeless tobacco: Never Used  Substance and Sexual Activity  . Alcohol use: Yes    Comment: rare  . Drug use: No  . Sexual activity: Yes    Partners: Female    Comment: vasectomy  Lifestyle  . Physical activity:    Days per week: Not on file    Minutes per session: Not on file  . Stress: Not on file  Relationships  . Social connections:    Talks on phone: Not on file    Gets together: Not on file    Attends religious service: Not on file    Active member of club or organization: Not on file    Attends meetings of clubs or organizations: Not on file    Relationship status: Not on file  Other Topics Concern  . Not on file  Social History Narrative   Lives at home w/ his wife   Right-handed   Caffeine: none    Allergies: No Known Allergies  Outpatient Meds: Current Outpatient Medications  Medication Sig Dispense Refill  . amLODipine (NORVASC) 10 MG  tablet TAKE 1 TABLET BY MOUTH EVERY DAY 90 tablet 0  . Cholecalciferol (VITAMIN D PO) Take 1 capsule by mouth daily.     Marland Kitchen doxazosin (CARDURA) 2 MG tablet TAKE 1 TABLET BY MOUTH EVERY DAY 90 tablet 1  . FIBER FORMULA PO Take 3 capsules by mouth 2 (two) times daily.    . fluticasone (FLONASE) 50 MCG/ACT nasal spray One to 2 sprays each nostril at bedtime and stay on this medication  for that at least the next 4-6 weeks (Patient taking differently: One to 2 sprays each nostril at bedtime prn) 16 g 6  . losartan (COZAAR) 100 MG tablet TAKE 1 TABLET BY MOUTH EVERY DAY 90 tablet 0  . memantine (NAMENDA) 5 MG tablet Take 1 tablet (5 mg total) by mouth 2 (two) times daily. 180 tablet 3  . potassium chloride SA (K-DUR,KLOR-CON) 20 MEQ tablet TAKE 1 TABLET BY MOUTH THREE TIMES DAILY 270 tablet 1  . tolterodine (DETROL LA) 4 MG 24 hr capsule TK ONE C PO QHS  10  . Na Sulfate-K Sulfate-Mg Sulf 17.5-3.13-1.6 GM/177ML SOLN Take 1 kit by mouth once for 1 dose. 354 mL 0   No current facility-administered medications for this visit.       ___________________________________________________________________ Objective   Exam:  BP (!) 142/90   Pulse 78   Ht '5\' 7"'  (1.702 m)   Wt 211 lb (95.7 kg)   BMI 33.05 kg/m  His wife [name]  is with him today.  General: He is well-appearing and conversational.  Eyes: sclera anicteric, no redness  ENT: oral mucosa moist without lesions, no cervical or supraclavicular lymphadenopathy  CV: RRR without murmur, S1/S2, no JVD, mild peripheral edema  Resp: clear to auscultation bilaterally, normal RR and effort noted  GI: soft, no tenderness, with active bowel sounds. No guarding or palpable organomegaly noted.  Skin; warm and dry, no rash or jaundice noted  Neuro: awake, alert and oriented x 3. Normal gross motor function and fluent speech He sometimes looks to his wife when he is searching for words or to recall timing of events.  Labs:  CBC Latest Ref Rng &  Units 11/13/2018 07/10/2018 04/04/2018  WBC 3.4 - 10.8 x10E3/uL 3.9 4.7 4.1  Hemoglobin 13.0 - 17.7 g/dL 14.5 14.1 13.9  Hematocrit 37.5 - 51.0 % 42.0 41.6 41.4  Platelets 150 - 450 x10E3/uL 194 206 202   CMP Latest Ref Rng & Units 11/13/2018 07/10/2018 04/12/2018  Glucose 65 - 99 mg/dL 89 84 77  BUN 8 - 27 mg/dL '11 13 13  ' Creatinine 0.76 - 1.27 mg/dL 1.10 1.09 1.22  Sodium 134 - 144 mmol/L 144 142 143  Potassium 3.5 - 5.2 mmol/L 3.2(L) 3.6 3.7  Chloride 96 - 106 mmol/L 102 103 106  CO2 20 - 29 mmol/L '24 25 26  ' Calcium 8.6 - 10.2 mg/dL 9.4 9.9 9.7  Total Protein 6.0 - 8.5 g/dL 5.9(L) 6.4 -  Total Bilirubin 0.0 - 1.2 mg/dL 0.5 0.6 -  Alkaline Phos 39 - 117 IU/L 86 94 -  AST 0 - 40 IU/L 21 22 -  ALT 0 - 44 IU/L 19 21 -    Stool FOBT pos 11/18/18   Assessment: Encounter Diagnoses  Name Primary?  . Heme positive stool Yes  . Chronic constipation     Well-appearing, no red flag symptoms, and hemoglobin normal.  While he was past colon cancer screening based on current guidelines, we must assume a true positive stool test for occult blood, especially over 5 years since last colonoscopy.  Plan:  Colonoscopy.  He is agreeable after discussion of procedure and risks.  All questions were answered.  He had some questions and concerns related to prior radioactive seed implant for prostate cancer, and I reassured him.  The benefits and risks of the planned procedure were described in detail with the patient or (when appropriate) their health care proxy.  Risks were outlined as including, but not limited to,  bleeding, infection, perforation, adverse medication reaction leading to cardiac or pulmonary decompensation, or pancreatitis (if ERCP).  The limitation of incomplete mucosal visualization was also discussed.  No guarantees or warranties were given.   Thank you for the courtesy of this consult.  Please call me with any questions or concerns.  Nelida Meuse [name]  CC: Referring provider noted  above

## 2018-12-03 ENCOUNTER — Ambulatory Visit: Payer: Medicare Other | Admitting: Gastroenterology

## 2018-12-04 ENCOUNTER — Ambulatory Visit (INDEPENDENT_AMBULATORY_CARE_PROVIDER_SITE_OTHER): Payer: Medicare Other | Admitting: Pharmacist Clinician (PhC)/ Clinical Pharmacy Specialist

## 2018-12-04 ENCOUNTER — Encounter: Payer: Self-pay | Admitting: Pharmacist Clinician (PhC)/ Clinical Pharmacy Specialist

## 2018-12-04 VITALS — BP 127/82

## 2018-12-04 DIAGNOSIS — I10 Essential (primary) hypertension: Secondary | ICD-10-CM

## 2018-12-04 NOTE — Progress Notes (Signed)
Patient presents with wife today for re-check of BMP and BP readings.  I took 4-5 BP readings in office and consistently had readings 130-134/82-85 with P of 67.  Ulis Rias took orthostatic readings for me and lying down was 150/91, sitting 127/82 and standing 123/80.  So he is having some orthostatic changes that could account for his dizziness.  Once BMP comes back would recommend stopping cardura (it is on the BEERS list of meds to avoid in the elderly) and changing Cozaar to Hyzaar.  He is going to stop namenda for 1 week to rule out that this is worsening his dizziness and fatigue and then call me after the week.  I suspect his home BP cuff is not accurate based on his home readings and also that it is 21 years old or more.

## 2018-12-04 NOTE — Addendum Note (Signed)
Addended by: Zannie Cove on: 12/04/2018 04:30 PM   Modules accepted: Orders

## 2018-12-05 ENCOUNTER — Ambulatory Visit (AMBULATORY_SURGERY_CENTER): Payer: Medicare Other | Admitting: Gastroenterology

## 2018-12-05 ENCOUNTER — Encounter: Payer: Self-pay | Admitting: Gastroenterology

## 2018-12-05 VITALS — BP 139/89 | HR 72 | Temp 97.5°F | Resp 16 | Ht 67.0 in | Wt 211.0 lb

## 2018-12-05 DIAGNOSIS — D123 Benign neoplasm of transverse colon: Secondary | ICD-10-CM

## 2018-12-05 DIAGNOSIS — D124 Benign neoplasm of descending colon: Secondary | ICD-10-CM | POA: Diagnosis not present

## 2018-12-05 DIAGNOSIS — R195 Other fecal abnormalities: Secondary | ICD-10-CM | POA: Diagnosis not present

## 2018-12-05 LAB — BMP8+EGFR
BUN/Creatinine Ratio: 14 (ref 10–24)
BUN: 15 mg/dL (ref 8–27)
CALCIUM: 10.1 mg/dL (ref 8.6–10.2)
CO2: 23 mmol/L (ref 20–29)
Chloride: 103 mmol/L (ref 96–106)
Creatinine, Ser: 1.11 mg/dL (ref 0.76–1.27)
GFR calc Af Amer: 74 mL/min/{1.73_m2} (ref >59)
GFR calc non Af Amer: 64 mL/min/{1.73_m2} (ref >59)
GLUCOSE: 84 mg/dL (ref 65–99)
Potassium: 3.3 mmol/L — ABNORMAL LOW (ref 3.5–5.2)
Sodium: 144 mmol/L (ref 134–144)

## 2018-12-05 MED ORDER — SODIUM CHLORIDE 0.9 % IV SOLN: 500.0000 mL | Freq: Once | INTRAVENOUS | Status: DC

## 2018-12-05 NOTE — Op Note (Signed)
[place] Patient Name: Ricky [name] Procedure Date: 12/05/2018 3:18 PM MRN: [id number] Endoscopist: Mallie Mussel L. Loletha Carrow , MD Age: 78 Referring MD:  Date of Birth: [birth year] Gender: Male Account #: [id number] Procedure:                Colonoscopy Indications:              Heme positive stool (diminutive adenomas 2011; no                            polyps 07/2013) Medicines:                Monitored Anesthesia Care Procedure:                Pre-Anesthesia Assessment:                           - Prior to the procedure, a History and Physical                            was performed, and patient medications and                            allergies were reviewed. The patient's tolerance of                            previous anesthesia was also reviewed. The risks                            and benefits of the procedure and the sedation                            options and risks were discussed with the patient.                            All questions were answered, and informed consent                            was obtained. Prior Anticoagulants: The patient has                            taken no previous anticoagulant or antiplatelet                            agents. ASA Grade Assessment: II - A patient with                            mild systemic disease. After reviewing the risks                            and benefits, the patient was deemed in                            satisfactory condition to undergo the procedure.  After obtaining informed consent, the colonoscope                            was passed under direct vision. Throughout the                            procedure, the patient's blood pressure, pulse, and                            oxygen saturations were monitored continuously. The                            Colonoscope was introduced through the anus and                            advanced to the the cecum, identified by                      appendiceal orifice and ileocecal valve. The                            colonoscopy was performed without difficulty. The                            patient tolerated the procedure well. The quality                            of the bowel preparation was good after lavage. The                            ileocecal valve, appendiceal orifice, and rectum                            were photographed. Scope In: 3:29:33 PM Scope Out: 3:48:01 PM Scope Withdrawal Time: 0 hours 11 minutes 19 seconds  Total Procedure Duration: 0 hours 18 minutes 28 seconds  Findings:                 The perianal and digital rectal examinations were                            normal.                           A diminutive polyp was found in the proximal                            transverse colon. The polyp was sessile. The polyp                            was removed with a cold snare. Resection and                            retrieval were complete.  A 10 mm polyp was found in the proximal descending                            colon. The polyp was semi-pedunculated. The polyp                            was removed with a hot snare. Resection and                            retrieval were complete.                           The exam was otherwise without abnormality on                            direct and retroflexion views. Complications:            No immediate complications. Estimated Blood Loss:     Estimated blood loss was minimal. Impression:               - One diminutive polyp in the proximal transverse                            colon, removed with a cold snare. Resected and                            retrieved.                           - One 10 mm polyp in the proximal descending colon,                            removed with a hot snare. Resected and retrieved.                           - The examination was otherwise normal on direct                             and retroflexion views. Recommendation:           - Patient has a contact number available for                            emergencies. The signs and symptoms of potential                            delayed complications were discussed with the                            patient. Return to normal activities tomorrow.                            Written discharge instructions were provided to the  patient.                           - Resume previous diet.                           - Continue present medications.                           - Await pathology results.                           - Based on current guidelines, no repeat                            colonoscopy or other colon cancer screening tests                            are necessary due to age. [name] L. Loletha Carrow, MD 12/05/2018 4:01:44 PM This report has been signed electronically.

## 2018-12-05 NOTE — Patient Instructions (Signed)
  Thank you for allowing [place] to care for you today!  Await pathology results by mail, approximately 2 weeks.   No further screening colonoscopies are recommended based on current age guidelines.  Resume previous diet and medications today,  and return to normal activities tomorrow.      YOU HAD AN ENDOSCOPIC PROCEDURE TODAY AT [facility]:   Refer to the procedure report that was given to you for any specific questions about what was found during the examination.  If the procedure report does not answer your questions, please call your gastroenterologist to clarify.  If you requested that your care partner not be given the details of your procedure findings, then the procedure report has been included in a sealed envelope for you to review at your convenience later.  YOU SHOULD EXPECT: Some feelings of bloating in the abdomen. Passage of more gas than usual.  Walking can help get rid of the air that was put into your GI tract during the procedure and reduce the bloating. If you had a lower endoscopy (such as a colonoscopy or flexible sigmoidoscopy) you may notice spotting of blood in your stool or on the toilet paper. If you underwent a bowel prep for your procedure, you may not have a normal bowel movement for a few days.  Please Note:  You might notice some irritation and congestion in your nose or some drainage.  This is from the oxygen used during your procedure.  There is no need for concern and it should clear up in a day or so.  SYMPTOMS TO REPORT IMMEDIATELY:   Following lower endoscopy (colonoscopy or flexible sigmoidoscopy):  Excessive amounts of blood in the stool  Significant tenderness or worsening of abdominal pains  Swelling of the abdomen that is new, acute  Fever of 100F or higher    For urgent or emergent issues, a gastroenterologist can be reached at any hour by calling [phone].   DIET:  We do recommend a small meal at first, but then you may  proceed to your regular diet.  Drink plenty of fluids but you should avoid alcoholic beverages for 24 hours.  ACTIVITY:  You should plan to take it easy for the rest of today and you should NOT DRIVE or use heavy machinery until tomorrow (because of the sedation medicines used during the test).    FOLLOW UP: Our staff will call the number listed on your records the next business day following your procedure to check on you and address any questions or concerns that you may have regarding the information given to you following your procedure. If we do not reach you, we will leave a message.  However, if you are feeling well and you are not experiencing any problems, there is no need to return our call.  We will assume that you have returned to your regular daily activities without incident.  If any biopsies were taken you will be contacted by phone or by letter within the next 1-3 weeks.  Please call us at [phone] if you have not heard about the biopsies in 3 weeks.    SIGNATURES/CONFIDENTIALITY: You and/or your care partner have signed paperwork which will be entered into your electronic medical record.  These signatures attest to the fact that that the information above on your After Visit Summary has been reviewed and is understood.  Full responsibility of the confidentiality of this discharge information lies with you and/or your care-partner.

## 2018-12-05 NOTE — Progress Notes (Signed)
Called to room to assist during endoscopic procedure.  Patient ID and intended procedure confirmed with present staff. Received instructions for my participation in the procedure from the performing physician.  

## 2018-12-05 NOTE — Progress Notes (Signed)
To PACU, VSS. Report to [email or web address] 

## 2018-12-06 ENCOUNTER — Telehealth: Payer: Self-pay | Admitting: *Deleted

## 2018-12-06 NOTE — Telephone Encounter (Signed)
  Follow up Call-  Call back number 12/05/2018 12/05/2018  Post procedure Call Back phone  # [phone]  Permission to leave phone message - Yes  Some recent data might be hidden     Patient questions:  Do you have a fever, pain , or abdominal swelling? No. Pain Score  0 *  Have you tolerated food without any problems? Yes.    Have you been able to return to your normal activities? Yes.    Do you have any questions about your discharge instructions: Diet   No. Medications  No. Follow up visit  No.  Do you have questions or concerns about your Care? No.  Actions: * If pain score is 4 or above: No action needed, pain <4.

## 2018-12-07 ENCOUNTER — Ambulatory Visit (INDEPENDENT_AMBULATORY_CARE_PROVIDER_SITE_OTHER): Payer: Medicare Other | Admitting: Family Medicine

## 2018-12-07 VITALS — BP 152/96 | HR 65 | Temp 97.1°F | Ht 67.0 in | Wt 214.0 lb

## 2018-12-07 DIAGNOSIS — R42 Dizziness and giddiness: Secondary | ICD-10-CM | POA: Diagnosis not present

## 2018-12-07 NOTE — Progress Notes (Signed)
BP (!) 152/96 (BP Location: Left Arm, Patient Position: Sitting, Cuff Size: Normal)   Pulse 65   Temp (!) 97.1 F (36.2 C) (Oral)   Ht 5\' 7"  (1.702 m)   Wt 214 lb (97.1 kg)   BMI 33.52 kg/m    Subjective:    Patient ID: Birdena Jubilee III, male    DOB: Jan 09, 1941, 78 y.o.   MRN: [id number]  HPI: Lavel Rieman III is a 78 y.o. male presenting on 12/07/2018 for Dizziness (Dizziness and left ear pain. Had a fall this morning due to dizziness. )   HPI Dizziness Patient comes in complaining of dizziness and spinning sensation and ringing in his left ear.  He says that it started this morning and he got to the point where he actually fell this morning because of the dizziness and spinning sensation.  He does not feel lightheaded but just feels like things are spinning.  Patient did feel little nausea with it.  Patient denies any headaches or sinus congestion or pressure or cough or congestion.  Patient denies any fevers or chills.  Relevant past medical, surgical, family and social history reviewed and updated as indicated. Interim medical history since our last visit reviewed. Allergies and medications reviewed and updated.  Review of Systems  Constitutional: Negative for chills and fever.  HENT: Negative for congestion, sinus pressure, sinus pain, sneezing and sore throat.   Respiratory: Negative for shortness of breath and wheezing.   Cardiovascular: Negative for chest pain and leg swelling.  Musculoskeletal: Negative for back pain and gait problem.  Skin: Negative for rash.  Neurological: Positive for dizziness. Negative for seizures, weakness, light-headedness and numbness.  All other systems reviewed and are negative.   Per HPI unless specifically indicated above   Allergies as of 12/07/2018   No Known Allergies     Medication List       Accurate as of December 07, 2018 12:03 PM. Always use your most recent med list.        amLODipine 10 MG tablet Commonly  known as:  NORVASC TAKE 1 TABLET BY MOUTH EVERY DAY   doxazosin 2 MG tablet Commonly known as:  CARDURA TAKE 1 TABLET BY MOUTH EVERY DAY   FIBER FORMULA PO Take 3 capsules by mouth 2 (two) times daily.   fluticasone 50 MCG/ACT nasal spray Commonly known as:  FLONASE One to 2 sprays each nostril at bedtime and stay on this medication for that at least the next 4-6 weeks   losartan 100 MG tablet Commonly known as:  COZAAR TAKE 1 TABLET BY MOUTH EVERY DAY   memantine 5 MG tablet Commonly known as:  NAMENDA Take 1 tablet (5 mg total) by mouth 2 (two) times daily.   potassium chloride SA 20 MEQ tablet Commonly known as:  K-DUR,KLOR-CON TAKE 1 TABLET BY MOUTH THREE TIMES DAILY   tolterodine 4 MG 24 hr capsule Commonly known as:  DETROL LA TK ONE C PO QHS   VITAMIN D PO Take 1 capsule by mouth daily.          Objective:    BP (!) 152/96 (BP Location: Left Arm, Patient Position: Sitting, Cuff Size: Normal)   Pulse 65   Temp (!) 97.1 F (36.2 C) (Oral)   Ht 5\' 7"  (1.702 m)   Wt 214 lb (97.1 kg)   BMI 33.52 kg/m   Wt Readings from Last 3 Encounters:  12/07/18 214 lb (97.1 kg)  12/05/18 211 lb (95.7 kg)  11/28/18 211 lb (95.7 kg)    Physical Exam Vitals signs and nursing note reviewed.  Constitutional:      General: He is not in acute distress.    Appearance: He is well-developed. He is not diaphoretic.  Eyes:     General: No scleral icterus.    Conjunctiva/sclera: Conjunctivae normal.  Neck:     Musculoskeletal: Neck supple.     Thyroid: No thyromegaly.  Cardiovascular:     Rate and Rhythm: Normal rate and regular rhythm.     Heart sounds: Normal heart sounds. No murmur.  Pulmonary:     Effort: Pulmonary effort is normal. No respiratory distress.     Breath sounds: Normal breath sounds. No wheezing.  Lymphadenopathy:     Cervical: No cervical adenopathy.  Skin:    General: Skin is warm and dry.     Findings: No rash.  Neurological:     General: No  focal deficit present.     Mental Status: He is alert and oriented to person, place, and time.     Sensory: No sensory deficit.     Coordination: Coordination normal.     Gait: Gait normal.  Psychiatric:        Behavior: Behavior normal.        Assessment & Plan:   Problem List Items Addressed This Visit    None    Visit Diagnoses    Vertigo    -  Primary    Recommended Antivert/meclizine and Epley's maneuvers and gave a handout on how to do Epley's maneuvers and then will call back if it worsens or does not improve, they will pick up Antivert over-the-counter.  Follow up plan: Return if symptoms worsen or fail to improve.  Counseling provided for all of the vaccine components No orders of the defined types were placed in this encounter.   Caryl Pina, MD [facility] 12/07/2018, 12:03 PM

## 2018-12-10 ENCOUNTER — Encounter: Payer: Self-pay | Admitting: Adult Health

## 2018-12-10 ENCOUNTER — Encounter: Payer: Self-pay | Admitting: Gastroenterology

## 2018-12-11 ENCOUNTER — Encounter: Payer: Self-pay | Admitting: Family Medicine

## 2018-12-11 ENCOUNTER — Telehealth: Payer: Self-pay | Admitting: Family Medicine

## 2018-12-11 ENCOUNTER — Ambulatory Visit (INDEPENDENT_AMBULATORY_CARE_PROVIDER_SITE_OTHER): Payer: Medicare Other | Admitting: Family Medicine

## 2018-12-11 VITALS — BP 160/94 | HR 66 | Temp 97.0°F | Ht 67.0 in | Wt 212.0 lb

## 2018-12-11 DIAGNOSIS — R413 Other amnesia: Secondary | ICD-10-CM | POA: Diagnosis not present

## 2018-12-11 DIAGNOSIS — E876 Hypokalemia: Secondary | ICD-10-CM | POA: Diagnosis not present

## 2018-12-11 DIAGNOSIS — R42 Dizziness and giddiness: Secondary | ICD-10-CM | POA: Diagnosis not present

## 2018-12-11 DIAGNOSIS — I10 Essential (primary) hypertension: Secondary | ICD-10-CM | POA: Diagnosis not present

## 2018-12-11 DIAGNOSIS — R5383 Other fatigue: Secondary | ICD-10-CM

## 2018-12-11 DIAGNOSIS — R0781 Pleurodynia: Secondary | ICD-10-CM

## 2018-12-11 DIAGNOSIS — C61 Malignant neoplasm of prostate: Secondary | ICD-10-CM

## 2018-12-11 NOTE — Telephone Encounter (Signed)
May need to get x-rays of back at visit.  He cannot take anti-inflammatory medicines because of adverse effects on blood pressure.  He will only have to stay on Tylenol.  If his dizziness persists and medications that have been prescribed or not working we may need to get him to see the neurologist.

## 2018-12-11 NOTE — Progress Notes (Signed)
Subjective:    Patient ID: Ricky [name], male    DOB: [birth year], 78 y.o.   MRN: [id number]  HPI Patient here today for vertigo and more confusion. He is also having some cough.  Comes in today with dizziness and falling and weakness.  He has been taking some as needed over-the-counter meclizine.  He has some chest wall soreness where he had a fall this weekend.  Other than the pain associated with that he has no other chest pain.  He denies any outright shortness of breath other than just feeling fatigued.  He says he wants to sleep a lot.  He denies any nausea vomiting diarrhea blood in the stool black tarry bowel movements or change in bowel habits.  He sees the urologist regularly.  We have always had trouble with this gentleman and keeping his blood pressure and potassium under the proper control.  His wife seems to be very concerned about his wellbeing and I think she is a good support to have at home.     Patient Active Problem List   Diagnosis Date Noted  . Sleep apnea in adult 04/08/2017  . Nocturnal leg cramps 03/23/2017  . Genetic testing 12/29/2016  . Family history of breast cancer 12/13/2016  . Family history of prostate cancer   . Malignant neoplasm prostate ([place]) 11/13/2016  . Memory difficulty 09/22/2016  . Headache syndrome 09/22/2016  . Cough variant asthma vs UACS  11/12/2015  . Hyperlipidemia 10/15/2015  . Vitamin D deficiency 03/12/2014  . BPH (benign prostatic hyperplasia) 10/01/2013  . Erectile dysfunction 10/01/2013  . PERSONAL HX COLONIC POLYPS 01/25/2010  . 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 Essential hypertension 01/13/2009   Outpatient Encounter Medications as of 12/11/2018  Medication Sig  . amLODipine (NORVASC) 10 MG tablet TAKE 1 TABLET BY MOUTH EVERY DAY  . Cholecalciferol (VITAMIN D PO) Take 1 capsule by mouth daily.   Marland Kitchen doxazosin (CARDURA) 2 MG tablet TAKE 1 TABLET BY MOUTH EVERY DAY  . FIBER FORMULA PO Take 3 capsules by mouth 2 (two)  times daily.  . fluticasone (FLONASE) 50 MCG/ACT nasal spray One to 2 sprays each nostril at bedtime and stay on this medication for that at least the next 4-6 weeks  . losartan (COZAAR) 100 MG tablet TAKE 1 TABLET BY MOUTH EVERY DAY  . potassium chloride SA (K-DUR,KLOR-CON) 20 MEQ tablet TAKE 1 TABLET BY MOUTH THREE TIMES DAILY (Patient taking differently: 20 mEq 4 (four) times daily. )  . tolterodine (DETROL LA) 4 MG 24 hr capsule TK ONE C PO QHS  . memantine (NAMENDA) 5 MG tablet Take 1 tablet (5 mg total) by mouth 2 (two) times daily. (Patient not taking: Reported on 12/11/2018)   No facility-administered encounter medications on file as of 12/11/2018.      Review of Systems  Constitutional: Negative.   HENT: Negative.   Eyes: Negative.   Respiratory: Positive for cough.   Cardiovascular: Negative.   Gastrointestinal: Negative.   Endocrine: Negative.   Genitourinary: Negative.   Musculoskeletal: Positive for back pain (lower right side back pain).  Skin: Negative.   Allergic/Immunologic: Negative.   Neurological: Positive for dizziness.  Hematological: Negative.   Psychiatric/Behavioral: Positive for confusion (more since holding namenda for a week (per pharm-d)).       Objective:   Physical Exam Vitals signs and nursing note reviewed.  Constitutional:      General: He is not in acute distress.    Appearance: Normal appearance.  He is well-developed.     Comments: The patient is pleasant but let his wife do a lot of the talking.  He continues to have problems with some confusion and memory issues and cannot be real clear on things during a discussion.  HENT:     Head: Normocephalic and atraumatic.     Right Ear: Tympanic membrane, ear canal and external ear normal. There is no impacted cerumen.     Left Ear: Tympanic membrane, ear canal and external ear normal. There is no impacted cerumen.     Nose: Nose normal. No congestion.     Mouth/Throat:     Mouth: Mucous membranes are  moist.     Pharynx: Oropharynx is clear. No oropharyngeal exudate.  Eyes:     General: No scleral icterus.       Right eye: No discharge.        Left eye: No discharge.     Extraocular Movements: Extraocular movements intact.     Conjunctiva/sclera: Conjunctivae normal.     Pupils: Pupils are equal, round, and reactive to light.  Neck:     Musculoskeletal: Normal range of motion and neck supple.     Thyroid: No thyromegaly.     Vascular: No carotid bruit.     Trachea: No tracheal deviation.     Comments: No bruits thyromegaly or anterior cervical adenopathy Cardiovascular:     Rate and Rhythm: Normal rate and regular rhythm.     Pulses: Normal pulses.     Heart sounds: Normal heart sounds. No murmur.     Comments: Heart is regular at 72/min Pulmonary:     Effort: Pulmonary effort is normal. No respiratory distress.     Breath sounds: Normal breath sounds. No wheezing or rales.     Comments: There is a bruise over the right lateral posterior chest wall.  This is where he felt recently when he lost his balance.  Lungs are clear anteriorly and posteriorly. Chest:     Chest wall: Tenderness present.  Abdominal:     General: Abdomen is flat. Bowel sounds are normal.     Palpations: Abdomen is soft. There is no mass.     Tenderness: There is no abdominal tenderness.  Musculoskeletal: Normal range of motion.        General: No tenderness.     Right lower leg: No edema.     Left lower leg: No edema.  Lymphadenopathy:     Cervical: No cervical adenopathy.  Skin:    General: Skin is warm and dry.     Findings: No rash.  Neurological:     Mental Status: He is disoriented.     Cranial Nerves: No cranial nerve deficit.     Gait: Gait normal.     Deep Tendon Reflexes: Reflexes normal.  Psychiatric:        Mood and Affect: Mood normal.        Behavior: Behavior normal.        Thought Content: Thought content normal.        Judgment: Judgment normal.     Comments: The patient is  pleasant but once again most of the conversation was with his wife.    BP (!) 185/109 (BP Location: Right Wrist)   Pulse 66   Temp (!) 97 F (36.1 C) (Oral)   Ht _0  (1.702 m)   Wt 212 lb (96.2 kg)   BMI 33.20 kg/m         Assessment &  Plan:  1. Dizziness -Continue with regular taking of meclizine 1/2 to 1 pill 3 times daily - BMP8+EGFR - Thyroid Panel With TSH - CBC with Differential/Platelet  2. Potassium serum decreased -Patient is currently on 80 mEq of potassium daily and we will recheck the BMP today. - BMP8+EGFR - Ambulatory referral to Nephrology  3. Essential hypertension -Blood pressure was high and on repeat was 150/92.  This was in the right arm with a large cuff sitting. - Ambulatory referral to Nephrology  4. Memory impairment -Because of some concern about the Aricept and Namenda both of these have been stopped temporarily.  5. Rib pain on right side -Use ice and take Tylenol  6. Prostate cancer [facility]) -Follow-up with urology as planned  7. Other fatigue -Check thyroid and CBC  No orders of the defined types were placed in this encounter.  Patient Instructions  Stay with over-the-counter meclizine 25 mg 3 times a day with food for at least 1 week.  If this makes the patient too sleepy, reduce this to a half of 1 3 times a day. Avoid caffeine Check blood pressures at least twice daily Make sure that he is not taking any kind of NSAIDs like ibuprofen Aleve Advil etc.  Only take Tylenol if needed for aches pains and fever Use ice over the area of the right lateral chest wall that is sore 20 minutes 3 or 4 times daily We will call with results of lab work as soon as those results become available I did speak with Dr. Lorrene Reid during the visit and she will help [place] try to get the patient into see the nephrologist in her group as soon as possible reevaluate his hypertension and hypokalemia issues.  Arrie Senate MD

## 2018-12-11 NOTE — Patient Instructions (Addendum)
Stay with over-the-counter meclizine 25 mg 3 times a day with food for at least 1 week.  If this makes the patient too sleepy, reduce this to a half of 1 3 times a day. Avoid caffeine Check blood pressures at least twice daily Make sure that he is not taking any kind of NSAIDs like ibuprofen Aleve Advil etc.  Only take Tylenol if needed for aches pains and fever Use ice over the area of the right lateral chest wall that is sore 20 minutes 3 or 4 times daily We will call with results of lab work as soon as those results become available I did speak with Dr. Lorrene Reid during the visit and she will help [place] try to get the patient into see the nephrologist in her group as soon as possible reevaluate his hypertension and hypokalemia issues.

## 2018-12-11 NOTE — Telephone Encounter (Signed)
FYI  Patient was seen Saturday and has vertigo.  Wife says he is no better and has an appointment to see Dr. Laurance Flatten at four pm.  She is concerned because he is confused, still stumbling around,(hurt back in shower).  Hopeful something can be done to help him.

## 2018-12-12 LAB — CBC WITH DIFFERENTIAL/PLATELET
BASOS: 0 %
Basophils Absolute: 0 10*3/uL (ref 0.0–0.2)
EOS (ABSOLUTE): 0.1 10*3/uL (ref 0.0–0.4)
Eos: 3 %
Hematocrit: 44.7 % (ref 37.5–51.0)
Hemoglobin: 15.3 g/dL (ref 13.0–17.7)
IMMATURE GRANS (ABS): 0 10*3/uL (ref 0.0–0.1)
IMMATURE GRANULOCYTES: 0 %
Lymphocytes Absolute: 1.3 10*3/uL (ref 0.7–3.1)
Lymphs: 29 %
MCH: 28.4 pg (ref 26.6–33.0)
MCHC: 34.2 g/dL (ref 31.5–35.7)
MCV: 83 fL (ref 79–97)
Monocytes Absolute: 0.6 10*3/uL (ref 0.1–0.9)
Monocytes: 12 %
Neutrophils Absolute: 2.5 10*3/uL (ref 1.4–7.0)
Neutrophils: 56 %
Platelets: 233 10*3/uL (ref 150–450)
RBC: 5.39 x10E6/uL (ref 4.14–5.80)
RDW: 13.8 % (ref 11.6–15.4)
WBC: 4.5 10*3/uL (ref 3.4–10.8)

## 2018-12-12 LAB — BMP8+EGFR
BUN/Creatinine Ratio: 14 (ref 10–24)
BUN: 15 mg/dL (ref 8–27)
CO2: 25 mmol/L (ref 20–29)
Calcium: 9.8 mg/dL (ref 8.6–10.2)
Chloride: 103 mmol/L (ref 96–106)
Creatinine, Ser: 1.11 mg/dL (ref 0.76–1.27)
GFR calc Af Amer: 74 mL/min/{1.73_m2} (ref >59)
GFR calc non Af Amer: 64 mL/min/{1.73_m2} (ref >59)
Glucose: 90 mg/dL (ref 65–99)
POTASSIUM: 3.5 mmol/L (ref 3.5–5.2)
Sodium: 143 mmol/L (ref 134–144)

## 2018-12-12 LAB — THYROID PANEL WITH TSH
Free Thyroxine Index: 2.3 (ref 1.2–4.9)
T3 Uptake Ratio: 27 % (ref 24–39)
T4, Total: 8.7 ug/dL (ref 4.5–12.0)
TSH: 1.33 u[IU]/mL (ref 0.450–4.500)

## 2018-12-13 ENCOUNTER — Other Ambulatory Visit: Payer: Medicare Other

## 2018-12-13 ENCOUNTER — Other Ambulatory Visit: Payer: Self-pay | Admitting: *Deleted

## 2018-12-13 DIAGNOSIS — R410 Disorientation, unspecified: Secondary | ICD-10-CM

## 2018-12-13 LAB — URINALYSIS, COMPLETE
Bilirubin, UA: NEGATIVE
Glucose, UA: NEGATIVE
Ketones, UA: NEGATIVE
Leukocytes, UA: NEGATIVE
Nitrite, UA: NEGATIVE
PH UA: 7 (ref 5.0–7.5)
Protein, UA: NEGATIVE
RBC, UA: NEGATIVE
Specific Gravity, UA: 1.02 (ref 1.005–1.030)
Urobilinogen, Ur: 1 mg/dL (ref 0.2–1.0)

## 2018-12-13 LAB — MICROSCOPIC EXAMINATION
Bacteria, UA: NONE SEEN
Epithelial Cells (non renal): NONE SEEN /hpf (ref 0–10)
RBC, UA: NONE SEEN /hpf (ref 0–2)
RENAL EPITHEL UA: NONE SEEN /HPF
WBC, UA: NONE SEEN /hpf (ref 0–5)

## 2018-12-14 LAB — URINE CULTURE: Organism ID, Bacteria: NO GROWTH

## 2018-12-18 ENCOUNTER — Other Ambulatory Visit: Payer: Self-pay

## 2018-12-18 ENCOUNTER — Emergency Department (HOSPITAL_COMMUNITY): Payer: Medicare Other

## 2018-12-18 ENCOUNTER — Encounter (HOSPITAL_COMMUNITY): Payer: Self-pay

## 2018-12-18 ENCOUNTER — Emergency Department (HOSPITAL_COMMUNITY)
Admission: EM | Admit: 2018-12-18 | Discharge: 2018-12-18 | Disposition: A | Discharge status: Home / Self Care | Payer: Medicare Other | Attending: Emergency Medicine | Admitting: Emergency Medicine

## 2018-12-18 ENCOUNTER — Ambulatory Visit: Payer: Medicare Other | Admitting: Family Medicine

## 2018-12-18 ENCOUNTER — Telehealth: Payer: Self-pay | Admitting: Family Medicine

## 2018-12-18 DIAGNOSIS — E876 Hypokalemia: Secondary | ICD-10-CM | POA: Insufficient documentation

## 2018-12-18 DIAGNOSIS — I10 Essential (primary) hypertension: Secondary | ICD-10-CM | POA: Insufficient documentation

## 2018-12-18 DIAGNOSIS — R42 Dizziness and giddiness: Secondary | ICD-10-CM | POA: Diagnosis not present

## 2018-12-18 DIAGNOSIS — R918 Other nonspecific abnormal finding of lung field: Secondary | ICD-10-CM | POA: Diagnosis not present

## 2018-12-18 DIAGNOSIS — Z79899 Other long term (current) drug therapy: Secondary | ICD-10-CM | POA: Diagnosis not present

## 2018-12-18 DIAGNOSIS — R410 Disorientation, unspecified: Secondary | ICD-10-CM | POA: Diagnosis not present

## 2018-12-18 DIAGNOSIS — R270 Ataxia, unspecified: Secondary | ICD-10-CM | POA: Diagnosis not present

## 2018-12-18 LAB — CBC
HCT: 41.7 % (ref 39.0–52.0)
HEMOGLOBIN: 14.2 g/dL (ref 13.0–17.0)
MCH: 28 pg (ref 26.0–34.0)
MCHC: 34.1 g/dL (ref 30.0–36.0)
MCV: 82.2 fL (ref 80.0–100.0)
Platelets: 223 10*3/uL (ref 150–400)
RBC: 5.07 MIL/uL (ref 4.22–5.81)
RDW: 13.1 % (ref 11.5–15.5)
WBC: 4.6 10*3/uL (ref 4.0–10.5)
nRBC: 0 % (ref 0.0–0.2)

## 2018-12-18 LAB — BASIC METABOLIC PANEL
Anion gap: 10 (ref 5–15)
BUN: 14 mg/dL (ref 8–23)
CO2: 23 mmol/L (ref 22–32)
Calcium: 9.8 mg/dL (ref 8.9–10.3)
Chloride: 107 mmol/L (ref 98–111)
Creatinine, Ser: 1.01 mg/dL (ref 0.61–1.24)
GFR calc Af Amer: >60 mL/min (ref >60)
Glucose, Bld: 106 mg/dL — ABNORMAL HIGH (ref 70–99)
Potassium: 3.3 mmol/L — ABNORMAL LOW (ref 3.5–5.1)
Sodium: 140 mmol/L (ref 135–145)

## 2018-12-18 MED ORDER — POTASSIUM CHLORIDE 10 MEQ/100ML IV SOLN
10.0000 meq | Freq: Once | INTRAVENOUS | Status: AC
  Administered 2018-12-18: 10 meq via INTRAVENOUS
  Filled 2018-12-18: qty 100

## 2018-12-18 MED ORDER — SODIUM CHLORIDE 0.9% FLUSH: 3.0000 mL | Freq: Once | INTRAVENOUS | Status: DC

## 2018-12-18 NOTE — Telephone Encounter (Signed)
Please call patient's wife.  I did speak to nephrology last week and they said they would put him on an urgent list to get him in to be seen but obviously they have not heard anything at this point.  I will have him go to the emergency room at [facility] or [facility] because of the persistent elevated blood pressure and the difficulty with keeping his potassium under good control with out taking multiple doses of potassium.  He could have renal artery stenosis that is causing this to not be able to get good control with the medicines he is taking. Please call the emergency room ahead of time and let them know that he is coming with poorly controlled blood pressure. Let his wife know that we are doing that. I will go to [facility].

## 2018-12-18 NOTE — Telephone Encounter (Signed)
Patient states that his BP elevated 184/115 and mid right back pain that has been going on for 2 weeks.  Wife is wondering if patient needs to go to the ED.  Has an appt today with you at 4pm.  No chest pain, dyspnea, left arm pain.

## 2018-12-18 NOTE — ED Provider Notes (Signed)
[facility] EMERGENCY DEPARTMENT Provider Note   CSN: [id number] Arrival date & time: 12/18/18  1351    History   Chief Complaint Chief Complaint  Patient presents with   Hypertension   Abnormal Lab    HPI Ricky [name] is a 78 y.o. male.     HPI Patient came in for high blood pressure.  Reportedly has had some mild hypokalemia 2.  Has had some work-up by PCP.  Sent in because had hypertension and difficulty controlling potassium.  Blood pressures been elevated.  Is currently on 3 different blood pressure medicines.  No chest pain.  No trouble breathing.  Does have some pain in right lateral hip.  Pain will come and go somewhat and will get is severe and crampy at a time.  Has had some memory disturbance and is some adjustments of medicine for that too.  No headache.  Has some mildly worsening confusion also.  Has had some dizziness at times.  States he will feel things move around.  States it is worse if he turns his head quickly. 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)    Family history of prostate cancer    GERD (gastroesophageal reflux disease)    History of adenomatous polyp of colon    2008; 2011   History of exercise stress test    01-14-2009 by dr hochrein-- normal w/ no ischemia   Hot flashes    DUE TO HORMONE THERAPY FOR PROSTATE CANCER   Hyperlipidemia    Hypertension    Mild memory disturbance    per dr Jannifer Franklin note (neurologist)   Nocturia more than twice per night    Nocturnal leg cramps 03/23/2017   OA (osteoarthritis)    OSA (obstructive sleep apnea)    moderate osa per study 02-12-2017  cpap recommended--- per pt never heard back from doctor for obtaining cpap   Pneumonia 02/2018   Prostate cancer [facility]) urologist-  dr ottelin/  oncologist-  dr Tammi Klippel   dx 10-10-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, Gleason 3+4, PSA 9.02, vol 78cc started ADT  to decrease size;  08/ 2018 prostate decrease size vol 46cc and PSA 0.36--- scheduled for radiative prostate seed implants    Patient Active Problem List   Diagnosis Date Noted   Sleep apnea in adult 04/08/2017   Nocturnal leg cramps 03/23/2017   Genetic testing 12/29/2016   Family history of breast cancer 12/13/2016   Family history of prostate cancer    Malignant neoplasm prostate ([place]) 11/13/2016   Memory difficulty 09/22/2016   Headache syndrome 09/22/2016   Cough variant asthma vs UACS  11/12/2015   Hyperlipidemia 10/15/2015   Vitamin D deficiency 03/12/2014   BPH (benign prostatic hyperplasia) 10/01/2013   Erectile dysfunction 10/01/2013   PERSONAL HX COLONIC POLYPS 01/25/2010   KNEE REPLACEMENT, BILATERAL, HX OF 01/25/2010   Essential hypertension 01/13/2009    Past Surgical History:  Procedure Laterality Date   CATARACT EXTRACTION W/ INTRAOCULAR LENS  IMPLANT, BILATERAL  2016   COLONOSCOPY  last one 08-08-2013   CYSTOSCOPY N/A 08/10/2017   Procedure: CYSTOSCOPY FLEXIBLE;  Surgeon: Kathie Rhodes, MD;  Location: [facility];  Service: Urology;  Laterality: N/A;  NO SEEDS FOUND IN BLADDER   HAND SURGERY Left 1992   KNEE ARTHROSCOPY Bilateral 1960's and 70's   PROSTATE BIOPSY  10-10-2016   dr Karsten Ro office  RADIOACTIVE SEED IMPLANT N/A 08/10/2017   Procedure: RADIOACTIVE SEED IMPLANT/BRACHYTHERAPY IMPLANT;  Surgeon: Kathie Rhodes, MD;  Location: [facility];  Service: Urology;  Laterality: N/A;    64   SEEDS IMPLANTED   ROTATOR CUFF REPAIR Right 2004   SPACE OAR INSTILLATION N/A 08/10/2017   Procedure: SPACE OAR INSTILLATION;  Surgeon: Kathie Rhodes, MD;  Location: [facility];  Service: Urology;  Laterality: N/A;   TOE SURGERY Left 1970   little toe   TOTAL KNEE ARTHROPLASTY Bilateral 01-25-2009   dr Wynelle Link [facility]        Home Medications    Prior to Admission medications   Medication Sig Start  Date End Date Taking? Authorizing Provider  acetaminophen (TYLENOL) 500 MG tablet Take 1,000 mg by mouth every 6 (six) hours as needed for moderate pain.   Yes [provider]  amLODipine (NORVASC) 10 MG tablet TAKE 1 TABLET BY MOUTH EVERY DAY Patient taking differently: Take 10 mg by mouth daily. TAKE 1 TABLET BY MOUTH EVERY DAY 10/03/18  Yes Chipper Herb, MD  Cholecalciferol (VITAMIN D PO) Take 1 capsule by mouth daily.    Yes [provider]  doxazosin (CARDURA) 2 MG tablet TAKE 1 TABLET BY MOUTH EVERY DAY Patient taking differently: Take 2 mg by mouth daily.  11/15/18  Yes Chipper Herb, MD  FIBER FORMULA PO Take 3 capsules by mouth 2 (two) times daily.   Yes [provider]  fluticasone (FLONASE) 50 MCG/ACT nasal spray One to 2 sprays each nostril at bedtime and stay on this medication for that at least the next 4-6 weeks 09/29/15  Yes Chipper Herb, MD  losartan (COZAAR) 100 MG tablet TAKE 1 TABLET BY MOUTH EVERY DAY Patient taking differently: Take 100 mg by mouth daily. TAKE 1 TABLET BY MOUTH EVERY DAY 10/03/18  Yes Chipper Herb, MD  meclizine (ANTIVERT) 25 MG tablet Take 25 mg by mouth 3 (three) times daily with meals.   Yes [provider]  potassium chloride SA (K-DUR,KLOR-CON) 20 MEQ tablet TAKE 1 TABLET BY MOUTH THREE TIMES DAILY Patient taking differently: Take 20 mEq by mouth 4 (four) times daily. 2 tablets in the morning and 2 at night 08/05/18  Yes Chipper Herb, MD  tolterodine (DETROL LA) 4 MG 24 hr capsule Take 4 mg by mouth at bedtime.  10/24/16  Yes [provider]  memantine (NAMENDA) 5 MG tablet Take 1 tablet (5 mg total) by mouth 2 (two) times daily. Patient not taking: Reported on 12/11/2018 10/14/18   Ward Givens, NP    Family History Family History  Problem Relation Age of Onset   Healthy Mother    Prostate cancer Father        Dx 92s; Deceased 9   Prostate cancer Brother 10       currently 10   Alcohol  abuse Brother    Bipolar disorder Brother    Prostate cancer Paternal Uncle        unsure of age; possibly 2nd uncle also has prostate ca   Prostate cancer Maternal Uncle        unsure of age   Breast cancer Maternal Aunt        unsure of age   Colon cancer Neg Hx    Esophageal cancer Neg Hx    Rectal cancer Neg Hx    Stomach cancer Neg Hx     Social History Social History   Tobacco Use  Smoking status: Never Smoker   Smokeless tobacco: Never Used  Substance Use Topics   Alcohol use: Yes    Comment: rare   Drug use: No     Allergies   Patient has no known allergies.   Review of Systems Review of Systems  Constitutional: Negative for appetite change.  HENT: Negative for congestion.   Respiratory: Negative for shortness of breath.   Gastrointestinal: Negative for abdominal pain.  Genitourinary: Negative for flank pain.  Musculoskeletal: Positive for back pain.  Neurological: Positive for dizziness. Negative for weakness.  Psychiatric/Behavioral: Positive for confusion.     Physical Exam Updated Vital Signs BP (!) 158/109    Pulse (!) 106    Temp (!) 97.4 F (36.3 C) (Oral)    Resp 17    SpO2 (!) 88%   Physical Exam Vitals signs and nursing note reviewed.  HENT:     Head: Normocephalic.  Eyes:     Pupils: Pupils are equal, round, and reactive to light.  Cardiovascular:     Comments: Mild tachycardia. Pulmonary:     Breath sounds: No wheezing or rhonchi.  Abdominal:     Palpations: There is no mass.     Tenderness: There is no abdominal tenderness.  Musculoskeletal:        General: No tenderness.     Right lower leg: No edema.     Left lower leg: No edema.  Neurological:     Mental Status: He is alert.     Comments: Some confusion.  Good grip strength bilaterally.  Finger-nose may be slightly off on left however.  Mild nystagmus.  Psychiatric:        Mood and Affect: Mood normal.      ED Treatments / Results  Labs (all labs ordered  are listed, but only abnormal results are displayed) Labs Reviewed  BASIC METABOLIC PANEL - Abnormal; Notable for the following components:      Result Value   Potassium 3.3 (*)    Glucose, Bld 106 (*)    All other components within normal limits  CBC    EKG EKG Interpretation  Date/Time:  Wednesday December 18 2018 22:06:42 EDT Ventricular Rate:  79 PR Interval:    QRS Duration: 102 QT Interval:  401 QTC Calculation: 460 R Axis:   -7 Text Interpretation:  Sinus rhythm Confirmed by Davonna Belling [phone]) on 12/18/2018 10:10:12 PM   Radiology Dg Chest 2 View  Result Date: 12/18/2018 CLINICAL DATA:  Weakness EXAM: CHEST - 2 VIEW COMPARISON:  05/13/2018 FINDINGS: Mild left basilar opacity, atelectasis versus pneumonia. Right lung is clear. No pleural effusion or pneumothorax. The heart is top-normal in size. Degenerative changes of the visualized thoracolumbar spine. IMPRESSION: Mild left basilar opacity, atelectasis versus pneumonia. Electronically Signed   By: Julian Hy M.D.   On: 12/18/2018 19:45   Mr Brain Wo Contrast  Result Date: 12/18/2018 CLINICAL DATA:  Confusion and ataxia. EXAM: MRI HEAD WITHOUT CONTRAST TECHNIQUE: Multiplanar, multiecho pulse sequences of the brain and surrounding structures were obtained without intravenous contrast. COMPARISON:  Brain MRI 10/05/2016 FINDINGS: BRAIN: There is no acute infarct, acute hemorrhage, hydrocephalus or extra-axial collection. The midline structures are normal. No midline shift or other mass effect. Early confluent hyperintense T2-weighted signal of the periventricular and deep white matter, most commonly due to chronic ischemic microangiopathy. Generalized atrophy without lobar predilection. Susceptibility-sensitive sequences show no chronic microhemorrhage or superficial siderosis. VASCULAR: Major intracranial arterial and venous sinus flow voids are normal. SKULL AND UPPER  CERVICAL SPINE: Calvarial bone marrow signal is  normal. There is no skull base mass. Visualized upper cervical spine and soft tissues are normal. SINUSES/ORBITS: No fluid levels or advanced mucosal thickening. No mastoid or middle ear effusion. The orbits are normal. 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 Electronically Signed   By: Ulyses Jarred M.D.   On: 12/18/2018 21:03    Procedures Procedures (including critical care time)  Medications Ordered in ED Medications  sodium chloride flush (NS) 0.9 % injection 3 mL (has no administration in time range)  potassium chloride 10 mEq in 100 mL IVPB (10 mEq Intravenous New Bag/Given 12/18/18 2148)     Initial Impression / Assessment and Plan / ED Course  I have reviewed the triage vital signs and the nursing notes.  Pertinent labs & imaging results that were available during my care of the patient were reviewed by me and considered in my medical decision making (see chart for details).        Patient presents with hypertension.  Has been hypertensive for a while and has had adjustment of medications.  Also mild hypokalemia.  Good renal function.  Has had some dizziness.  MRI reassuring.  Has some left hip pain that appears muscular.  Worse with palpation.  Doubt pathology such as AAA causing the pain.  Outpatient nephrology follow-up is being arranged with PCP.  I do not see criteria for admission at this time.  Will discharge home.  X-ray showed atelectasis versus infiltrate.  However has had a little bit of a cough but no sputum production no fevers.  White count reassuring.  Not hypoxic despite single measurement of 88% which I think was air.  Will discharge home  Final Clinical Impressions(s) / ED Diagnoses   Final diagnoses:  Hypokalemia  Hypertension, unspecified type    ED Discharge Orders    None       Davonna Belling, MD 12/18/18 2301

## 2018-12-18 NOTE — ED Notes (Signed)
Pt ambulated hall. SPO2 >95%. Denies SOB or dizziness

## 2018-12-18 NOTE — Discharge Instructions (Addendum)
Follow-up with Dr. Laurance Flatten for further evaluation of the low potassium and hypertension.  Your MRI of the brain is reassuring with the dizziness.

## 2018-12-18 NOTE — ED Triage Notes (Addendum)
Pt was sent here by his PCP for HTN and hypokalemia. PCP is concerned with kidney function. Pt denies headache at present. AOX4. Current BP is 148/90, pt does report right side back pain, took 2 tylenol.

## 2018-12-18 NOTE — Telephone Encounter (Signed)
Spoke with wife - she will take him to [facility] now.

## 2018-12-19 ENCOUNTER — Telehealth: Payer: Self-pay | Admitting: *Deleted

## 2018-12-19 ENCOUNTER — Telehealth: Payer: Self-pay | Admitting: Family Medicine

## 2018-12-19 NOTE — Telephone Encounter (Signed)
Wife called stating that patient was seen in the ER yesterday and they took him of his namenda, and that patietn seems very confused, he went to bed at 1am and has been up since 3 am walking around the house turning on lights.  Wife concerned,

## 2018-12-19 NOTE — Telephone Encounter (Signed)
PT is calling back to talk to chanda said she had called her

## 2018-12-19 NOTE — Telephone Encounter (Signed)
Patient's wife states that patient's confusion is worsen over the past 3 days. And would like to know if patient can start Namenda back

## 2018-12-19 NOTE — Telephone Encounter (Signed)
Will have to be addressed by DR. Laurance Flatten

## 2018-12-20 NOTE — Telephone Encounter (Signed)
Ann aware.

## 2018-12-20 NOTE — Telephone Encounter (Signed)
Please call patient wife and make sure that she  goes ahead and restarts the Namenda Please check again with nephrology about getting him in as soon as possible, if possible.

## 2019-01-03 DIAGNOSIS — E876 Hypokalemia: Secondary | ICD-10-CM | POA: Diagnosis not present

## 2019-01-03 DIAGNOSIS — I10 Essential (primary) hypertension: Secondary | ICD-10-CM | POA: Diagnosis not present

## 2019-01-28 DIAGNOSIS — E876 Hypokalemia: Secondary | ICD-10-CM | POA: Diagnosis not present

## 2019-01-28 DIAGNOSIS — I10 Essential (primary) hypertension: Secondary | ICD-10-CM | POA: Diagnosis not present

## 2019-02-17 ENCOUNTER — Other Ambulatory Visit: Payer: Self-pay | Admitting: *Deleted

## 2019-02-17 ENCOUNTER — Telehealth: Payer: Self-pay | Admitting: Family Medicine

## 2019-02-17 NOTE — Telephone Encounter (Signed)
Pt is bringing meds by so that we can help him sort this out.

## 2019-02-17 NOTE — Progress Notes (Signed)
Pt aware of medications times and they were written out for him on a sheet of paper to help him remember when to take them.

## 2019-02-19 ENCOUNTER — Telehealth: Payer: Self-pay | Admitting: Family Medicine

## 2019-02-19 NOTE — Telephone Encounter (Signed)
Son is now POA and helping pt with medications.  He wanted to clarify what pt is taking and make sure we were on the same page.  Discussed.

## 2019-02-19 NOTE — Telephone Encounter (Signed)
For your review

## 2019-02-25 ENCOUNTER — Telehealth: Payer: Self-pay | Admitting: Family Medicine

## 2019-02-25 ENCOUNTER — Telehealth: Payer: Self-pay | Admitting: *Deleted

## 2019-02-25 MED ORDER — LOSARTAN POTASSIUM 100 MG PO TABS: 100.0000 mg | ORAL_TABLET | Freq: Every day | ORAL | 0 refills | Status: DC

## 2019-02-25 NOTE — Telephone Encounter (Signed)
Please advise on what we should switch this to.

## 2019-02-25 NOTE — Telephone Encounter (Signed)
To Jamie B to refill.

## 2019-02-25 NOTE — Telephone Encounter (Signed)
What is the name of the medication? Lasartan 100 mg   Have you contacted your pharmacy to request a refill? Yes  Which pharmacy would you like this sent to? [facility]    Patient notified that their request is being sent to the clinical staff for review and that they should receive a call once it is complete. If they do not receive a call within 24 hours they can check with their pharmacy or our office.    Call his son [name] when it is done.

## 2019-02-25 NOTE — Telephone Encounter (Signed)
Please change to generic Diovan 360 or olmesartan 40 daily

## 2019-02-25 NOTE — Telephone Encounter (Signed)
Refill sent.

## 2019-02-26 MED ORDER — VALSARTAN 320 MG PO TABS: 320.0000 mg | ORAL_TABLET | Freq: Every day | ORAL | 1 refills | Status: DC

## 2019-02-26 NOTE — Telephone Encounter (Signed)
The maximum dose of losartan is 100 mg daily.  The maximum dose of Diovan is 320 daily. not 360.  I would change to valsartan which is Diovan 320, once daily.  Blood pressure should be checked closely to make sure that his blood pressure is stayed good and he will need a BMP 4 weeks after start this.

## 2019-02-26 NOTE — Telephone Encounter (Signed)
Please clarify - Diovan should be 160 or 360?

## 2019-02-26 NOTE — Telephone Encounter (Signed)
Son aware yesterday - new med to be sent today

## 2019-02-28 ENCOUNTER — Other Ambulatory Visit: Payer: Self-pay | Admitting: Family Medicine

## 2019-02-28 MED ORDER — AMLODIPINE BESYLATE 10 MG PO TABS: 10.0000 mg | ORAL_TABLET | Freq: Every day | ORAL | 0 refills | Status: DC

## 2019-02-28 NOTE — Telephone Encounter (Signed)
Rx sent and message left for wife

## 2019-02-28 NOTE — Telephone Encounter (Signed)
What is the name of the medication? Amolodipine  Have you contacted your pharmacy to request a refill? Yes  Which pharmacy would you like this sent to? [facility]   Patient notified that their request is being sent to the clinical staff for review and that they should receive a call once it is complete. If they do not receive a call within 24 hours they can check with their pharmacy or our office.

## 2019-03-17 DIAGNOSIS — I10 Essential (primary) hypertension: Secondary | ICD-10-CM | POA: Diagnosis not present

## 2019-03-19 ENCOUNTER — Other Ambulatory Visit: Payer: Self-pay

## 2019-03-19 ENCOUNTER — Ambulatory Visit (INDEPENDENT_AMBULATORY_CARE_PROVIDER_SITE_OTHER): Payer: Medicare Other | Admitting: Family Medicine

## 2019-03-19 ENCOUNTER — Encounter: Payer: Self-pay | Admitting: Family Medicine

## 2019-03-19 DIAGNOSIS — I10 Essential (primary) hypertension: Secondary | ICD-10-CM

## 2019-03-19 DIAGNOSIS — E559 Vitamin D deficiency, unspecified: Secondary | ICD-10-CM

## 2019-03-19 DIAGNOSIS — E785 Hyperlipidemia, unspecified: Secondary | ICD-10-CM

## 2019-03-19 DIAGNOSIS — R410 Disorientation, unspecified: Secondary | ICD-10-CM

## 2019-03-19 DIAGNOSIS — G4730 Sleep apnea, unspecified: Secondary | ICD-10-CM

## 2019-03-19 DIAGNOSIS — R413 Other amnesia: Secondary | ICD-10-CM

## 2019-03-19 DIAGNOSIS — E876 Hypokalemia: Secondary | ICD-10-CM

## 2019-03-19 DIAGNOSIS — C61 Malignant neoplasm of prostate: Secondary | ICD-10-CM | POA: Diagnosis not present

## 2019-03-19 MED ORDER — SPIRONOLACTONE 50 MG PO TABS: 50.0000 mg | ORAL_TABLET | Freq: Every day | ORAL | 0 refills | Status: DC

## 2019-03-19 MED ORDER — POTASSIUM CHLORIDE CRYS ER 20 MEQ PO TBCR: 20.0000 meq | EXTENDED_RELEASE_TABLET | Freq: Every day | ORAL | 1 refills | Status: DC

## 2019-03-19 NOTE — Patient Instructions (Signed)
Follow-up with Dr. Karsten Ro, urologist as planned Follow-up with Dr. Jannifer Franklin, neurologist as planned along with his nurse practitioner Ward Givens Follow-up with his nephrologist, Dr. Pearson Grippe as planned Check blood pressures regularly Reduce sodium intake Monitor medicines closely Please get my chart so that lab work in our office can be monitored when it is done. Make sure that both Ricky [name] and [name] have phone numbers in place for [place] to call if we need to contact either 1 of them. Ricky [name] has power of attorney.

## 2019-03-19 NOTE — Progress Notes (Signed)
Virtual Visit Via telephone Note I connected with@ on 03/19/19 by telephone and verified that I am speaking with the correct person or authorized healthcare agent using two identifiers. Ricky [name] is currently located at home and there are no unauthorized people in close proximity. I completed this visit while in a private location in my home .  This visit type was conducted due to national recommendations for restrictions regarding the COVID-19 Pandemic (e.g. social distancing).  This format is felt to be most appropriate for this patient at this time.  All issues noted in this document were discussed and addressed.  No physical exam was performed.    I discussed the limitations, risks, security and privacy concerns of performing an evaluation and management service by telephone and the availability of in person appointments. I also discussed with the patient that there may be a patient responsible charge related to this service. The patient expressed understanding and agreed to proceed.   Date:  03/19/2019    ID:  Ricky [name]      29-Jul-1941        [id number]   Patient Care Team Patient Care Team: Ricky Herb, MD as PCP - General (Family Medicine) Ricky Squibb, MD as Consulting Physician (Dermatology) Ricky Rockers, MD as Consulting Physician (Pulmonary Disease) Ricky Rhodes, MD as Consulting Physician (Urology) Ricky Guys, MD as Consulting Physician (Ophthalmology)  Reason for Visit: Primary Care Follow-up     History of Present Illness & Review of Systems:     Ricky [name] is a 78 y.o. year old male primary care patient that presents today for a telehealth visit.  The patient seemed calm and relaxed this time and in the past visit seems to been very stressed and did not know the answers to questions that were asked of him.  Both of his sons are taking care of him Ricky [name] has the power of attorney but Ricky [name] was at home with his dad today helping him with answering questions  and reviewing medications.  Both Ricky [name] and Ricky [name] feel that their dad seems to be doing better since he has left his wife.  The current wife that he is leaving is not the mother of his children.  The patient today denies any chest pain pressure or tightness or shortness of breath anymore than usual he denies any trouble with swallowing heartburn indigestion nausea vomiting diarrhea or blood in the stool and is passing his water well.  He is being followed regularly by Ricky [name], his urologist by Ricky [name], his nephrologist by Ricky [name], his neurologist along with Ricky [name]' nurse practitioner Ricky [name].  Some of the medicines have been changed.  He is currently not taking losartan but now taking valsartan 320 mg.  He is also taking Aldactone now 50 mg instead of 25 mg and is only taking 1 potassium 20 mEq daily.  There are plans for him to go back and repeat lab work with Ricky [name] in 1 week.  We would asked that they additionally check a CBC BMP and lipid liver panel so he does not have to come to the office to get this.  Review of systems as stated, otherwise negative.  The patient does not have symptoms concerning for COVID-19 infection (fever, chills, cough, or new shortness of breath).      Current Medications (Verified) Allergies as of 03/19/2019   No Known Allergies     Medication List  Accurate as of March 19, 2019 12:06 PM. If you have any questions, ask your nurse or doctor.        acetaminophen 500 MG tablet Commonly known as:  TYLENOL Take 1,000 mg by mouth every 6 (six) hours as needed for moderate pain.   amLODipine 10 MG tablet Commonly known as:  NORVASC Take 1 tablet (10 mg total) by mouth daily.   doxazosin 2 MG tablet Commonly known as:  CARDURA TAKE 1 TABLET BY MOUTH EVERY DAY   FIBER FORMULA PO Take 3 capsules by mouth 2 (two) times daily.   fluticasone 50 MCG/ACT nasal spray Commonly known as:  FLONASE One to 2 sprays each nostril at  bedtime and stay on this medication for that at least the next 4-6 weeks   losartan 100 MG tablet Commonly known as:  COZAAR Take 1 tablet (100 mg total) by mouth daily.   meclizine 25 MG tablet Commonly known as:  ANTIVERT Take 25 mg by mouth 3 (three) times daily with meals.   memantine 5 MG tablet Commonly known as:  NAMENDA Take 1 tablet (5 mg total) by mouth 2 (two) times daily.   potassium chloride SA 20 MEQ tablet Commonly known as:  K-DUR TAKE 1 TABLET BY MOUTH THREE TIMES DAILY What changed:    when to take this  additional instructions   spironolactone 25 MG tablet Commonly known as:  ALDACTONE Take 1 tablet by mouth daily.   tolterodine 4 MG 24 hr capsule Commonly known as:  DETROL LA Take 4 mg by mouth at bedtime.   valsartan 320 MG tablet Commonly known as:  DIOVAN Take 1 tablet (320 mg total) by mouth daily.   VITAMIN D PO Take 1 capsule by mouth daily.           Allergies (Verified)    Patient has no known allergies.  Past Medical History Past Medical History:  Diagnosis Date  . Chronic cough   . Deviated nasal septum   . 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 Family history of prostate cancer   . GERD (gastroesophageal reflux disease)   . History of adenomatous polyp of colon    2008; 2011  . History of exercise stress test    01-14-2009 by dr hochrein-- normal w/ no ischemia  . Hot flashes    DUE TO HORMONE THERAPY FOR PROSTATE CANCER  . Hyperlipidemia   . Hypertension   . Mild memory disturbance    per dr Ricky [name] note (neurologist)  . Nocturia more than twice per night   . Nocturnal leg cramps 03/23/2017  . OA (osteoarthritis)   . OSA (obstructive sleep apnea)    moderate osa per study 02-12-2017  cpap recommended--- per pt never heard back from doctor for obtaining cpap  . Pneumonia 02/2018  . Prostate cancer [facility]) urologist-  dr [name]/  oncologist-  dr Tammi [name]   dx 10-10-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, Gleason 3+4, PSA  9.02, vol 78cc started ADT to decrease size;  08/ 2018 prostate decrease size vol 46cc and PSA 0.36--- scheduled for radiative prostate seed implants     Past Surgical History:  Procedure Laterality Date  . CATARACT EXTRACTION W/ INTRAOCULAR LENS  IMPLANT, BILATERAL  2016  . COLONOSCOPY  last one 08-08-2013  . CYSTOSCOPY N/A 08/10/2017   Procedure: CYSTOSCOPY FLEXIBLE;  Surgeon: Ricky Rhodes, MD;  Location: [facility];  Service: Urology;  Laterality: N/A;  NO SEEDS FOUND IN BLADDER  .  HAND SURGERY Left 1992  . KNEE ARTHROSCOPY Bilateral 1960's and 70's  . PROSTATE BIOPSY  10-10-2016   dr Karsten [name] office  . RADIOACTIVE SEED IMPLANT N/A 08/10/2017   Procedure: RADIOACTIVE SEED IMPLANT/BRACHYTHERAPY IMPLANT;  Surgeon: Ricky Rhodes, MD;  Location: [facility];  Service: Urology;  Laterality: N/A;    64   SEEDS IMPLANTED  . ROTATOR CUFF REPAIR Right 2004  . SPACE OAR INSTILLATION N/A 08/10/2017   Procedure: SPACE OAR INSTILLATION;  Surgeon: Ricky Rhodes, MD;  Location: [facility];  Service: Urology;  Laterality: N/A;  . TOE SURGERY Left 1970   little toe  . TOTAL KNEE ARTHROPLASTY Bilateral 01-25-2009   dr Wynelle Link [facility]    Social History   Socioeconomic History  . Marital status: Married    Spouse name: [name]  . Number of children: 3  . Years of education: BA  . Highest education level: Not on file  Occupational History  . Occupation: Retired  Scientific laboratory technician  . Financial resource strain: Not on file  . Food insecurity:    Worry: Not on file    Inability: Not on file  . Transportation needs:    Medical: Not on file    Non-medical: Not on file  Tobacco Use  . Smoking status: Never Smoker  . Smokeless tobacco: Never Used  Substance and Sexual Activity  . Alcohol use: Yes    Comment: rare  . Drug use: No  . Sexual activity: Yes    Partners: Female    Comment: vasectomy  Lifestyle  . Physical activity:    Days per week: Not on file     Minutes per session: Not on file  . Stress: Not on file  Relationships  . Social connections:    Talks on phone: Not on file    Gets together: Not on file    Attends religious service: Not on file    Active member of club or organization: Not on file    Attends meetings of clubs or organizations: Not on file    Relationship status: Not on file  Other Topics Concern  . Not on file  Social History Narrative   Lives at home w/ his wife   Right-handed   Caffeine: none     Family History  Problem Relation Age of Onset  . Healthy Mother   . Prostate cancer Father        Dx 6s; Deceased 25  . Prostate cancer Brother 15       currently 22  . Alcohol abuse Brother   . Bipolar disorder Brother   . Prostate cancer Paternal Uncle        unsure of age; possibly 2nd uncle also has prostate ca  . Prostate cancer Maternal Uncle        unsure of age  . Breast cancer Maternal Aunt        unsure of age  . Colon cancer Neg Hx   . Esophageal cancer Neg Hx   . Rectal cancer Neg Hx   . Stomach cancer Neg Hx       Labs/Other Tests and Data Reviewed:    Wt Readings from Last 3 Encounters:  12/11/18 212 lb (96.2 kg)  12/07/18 214 lb (97.1 kg)  12/05/18 211 lb (95.7 kg)   Temp Readings from Last 3 Encounters:  12/18/18 (!) 97.4 F (36.3 C) (Oral)  12/11/18 (!) 97 F (36.1 C) (Oral)  12/07/18 (!) 97.1 F (36.2 C) (Oral)  BP Readings from Last 3 Encounters:  12/18/18 (!) 148/94  12/11/18 (!) 160/94  12/07/18 (!) 152/96   Pulse Readings from Last 3 Encounters:  12/18/18 78  12/11/18 66  12/07/18 65     No results found for: HGBA1C Lab Results  Component Value Date   LDLCALC 106 (H) 11/13/2018   CREATININE 1.01 12/18/2018       Chemistry      Component Value Date/Time   NA 140 12/18/2018 1418   NA 143 12/11/2018 1653   K 3.3 (L) 12/18/2018 1418   CL 107 12/18/2018 1418   CO2 23 12/18/2018 1418   BUN 14 12/18/2018 1418   BUN 15 12/11/2018 1653   CREATININE 1.01  12/18/2018 1418      Component Value Date/Time   CALCIUM 9.8 12/18/2018 1418   ALKPHOS 86 11/13/2018 1530   AST 21 11/13/2018 1530   ALT 19 11/13/2018 1530   BILITOT 0.5 11/13/2018 1530         OBSERVATIONS/ OBJECTIVE:     The patient was with his son Ricky [name] and all of the information collected was verified with both the patient and his son being present.  Blood pressure readings at home sounded really good.  There were several readings recorded and all were less than 120/80 except for one that was 142/80.  The ones that were less than 120/80 were 119/72, 109/71, and 107/70.  The patient has complained with some dizziness.  He has had this complaint in the past.  They will continue to monitor blood pressures at home and report these readings to the nephrologist or the nephrologist nurse.  If the dizziness persists and does not get better they will also plan to call and talk with Ricky [name] office.  Physical exam deferred due to nature of telephonic visit.  ASSESSMENT & PLAN    Time:   Today, I have spent 36 minutes with the patient via telephone discussing the above including Covid precautions.     Visit Diagnoses: 1. Essential hypertension -Blood pressures reported are better.  Patient is no longer taking losartan but is on Diovan 320 once daily. -He is also taking Aldactone 50 mg daily instead of 25 daily. -He is also taking doxazosin 2 mg daily.  2. Memory impairment -He is on Namenda 5 mg twice daily.  3. Prostate cancer ([place]) -He will follow-up with Ricky [name] as planned and the sons will check on this appointment.  4. Vitamin D deficiency -Continue with vitamin D replacement 1000 units  5. Hyperlipidemia with target LDL less than 100 -Continue with aggressive therapeutic lifestyle changes pending results of lab work  6. Sleep apnea, unspecified type -Not sure he is using CPAP currently.  7. Confusion -On the phone today and with his son being present patient seemed  to be less confused.  8. Potassium serum decreased -The nephrologist has reduced his potassium to 20 mEq daily and will plan to do repeat blood work in about 1 week.  Patient Instructions  Follow-up with Ricky [name], urologist as planned Follow-up with Ricky [name], neurologist as planned along with his nurse practitioner Ward Givens Follow-up with his nephrologist, Ricky [name] as planned Check blood pressures regularly Reduce sodium intake Monitor medicines closely Please get my chart so that lab work in our office can be monitored when it is done. Make sure that both Ricky [name] and Ricky [name] have phone numbers in place for [place] to call if we need to contact either 1 of them. Ricky [name]  has power of attorney.     The above assessment and management plan was discussed with the patient. The patient verbalized understanding of and has agreed to the management plan. Patient is aware to call the clinic if symptoms persist or worsen. Patient is aware when to return to the clinic for a follow-up visit. Patient educated on when it is appropriate to go to the emergency department.    Ricky Herb, MD [facility] [address], [place], [place] [address] Ph [phone]   Arrie Senate MD

## 2019-03-19 NOTE — Addendum Note (Signed)
Addended by: Zannie Cove on: 03/19/2019 03:18 PM   Modules accepted: Orders

## 2019-04-18 ENCOUNTER — Telehealth: Payer: Self-pay | Admitting: Family Medicine

## 2019-04-18 NOTE — Telephone Encounter (Signed)
Aware.  Message will be given to nurse when she returns to work on Monday.  Son says father's condition is about same as usual with his weakness and dizziness.( I suggested going to hospital? ) He wants to discuss who will be taking care of his father's care .  He confirms that his father does not live with him.

## 2019-04-21 NOTE — Telephone Encounter (Signed)
Appt made.  Only wants to see Rakes.

## 2019-04-29 ENCOUNTER — Other Ambulatory Visit: Payer: Self-pay

## 2019-05-01 ENCOUNTER — Ambulatory Visit (INDEPENDENT_AMBULATORY_CARE_PROVIDER_SITE_OTHER): Payer: Medicare Other | Admitting: Family Medicine

## 2019-05-01 ENCOUNTER — Encounter: Payer: Self-pay | Admitting: Family Medicine

## 2019-05-01 ENCOUNTER — Other Ambulatory Visit: Payer: Self-pay

## 2019-05-01 VITALS — BP 100/69 | HR 71 | Temp 96.8°F | Ht 67.0 in | Wt 196.0 lb

## 2019-05-01 DIAGNOSIS — I10 Essential (primary) hypertension: Secondary | ICD-10-CM | POA: Diagnosis not present

## 2019-05-01 DIAGNOSIS — R531 Weakness: Secondary | ICD-10-CM

## 2019-05-01 DIAGNOSIS — R5383 Other fatigue: Secondary | ICD-10-CM | POA: Diagnosis not present

## 2019-05-01 DIAGNOSIS — R42 Dizziness and giddiness: Secondary | ICD-10-CM | POA: Diagnosis not present

## 2019-05-01 MED ORDER — AMLODIPINE BESYLATE 5 MG PO TABS: 5.0000 mg | ORAL_TABLET | Freq: Every day | ORAL | 3 refills | Status: DC

## 2019-05-01 MED ORDER — MECLIZINE HCL 12.5 MG PO TABS: 12.5000 mg | ORAL_TABLET | Freq: Three times a day (TID) | ORAL | 0 refills | Status: DC | PRN

## 2019-05-01 NOTE — Progress Notes (Signed)
Subjective:  Patient ID: Ricky [name], male    DOB: 1941-03-17, 78 y.o.   MRN: [id number]  Patient Care Team: Baruch Gouty, FNP as PCP - General (Family Medicine) Celene Squibb, MD as Consulting Physician (Dermatology) Tanda Rockers, MD as Consulting Physician (Pulmonary Disease) Kathie Rhodes, MD as Consulting Physician (Urology) Rutherford Guys, MD as Consulting Physician (Ophthalmology)   Chief Complaint:  Fatigue and Dizziness   HPI: Ricky [name] is a 78 y.o. male presenting on 05/01/2019 for Fatigue and Dizziness   Pt presents today for generalized weakness, fatigue, dizziness, and concerns about blood pressure. Pt states he has been having these intermittent weak and dizzy spells for several months. States at times he feels like he looses his balance. He denies increased confusion, focal deficits, slurred speech, facial droop, or chest pain. No nausea or vomiting with the dizziness. States he runs out of steam early in the day. States he only has energy for a short time and then feels he needs to sleep. He has not been checking his blood pressure at home. He has not taken his medications this morning. He states he takes his BPH medications in the morning with all other medications.   Pt states he is eating and drinking well, son states his appetite is ok.   Dizziness This is a recurrent problem. The current episode started more than 1 month ago. The problem has been waxing and waning. Associated symptoms include coughing (chronic), fatigue, vertigo and weakness (generalized). Pertinent negatives include no abdominal pain, anorexia, arthralgias, change in bowel habit, chest pain, chills, congestion, diaphoresis, fever, headaches, joint swelling, myalgias, nausea, neck pain, numbness, rash, sore throat, swollen glands, urinary symptoms, visual change or vomiting. The symptoms are aggravated by standing, twisting and walking. He has tried nothing for the symptoms.    Relevant past  medical, surgical, family, and social history reviewed and updated as indicated.  Allergies and medications reviewed and updated. Date reviewed: Chart in Epic.   Past Medical History:  Diagnosis Date  . Chronic cough   . Deviated nasal septum   . 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 Family history of prostate cancer   . GERD (gastroesophageal reflux disease)   . History of adenomatous polyp of colon    2008; 2011  . History of exercise stress test    01-14-2009 by dr hochrein-- normal w/ no ischemia  . Hot flashes    DUE TO HORMONE THERAPY FOR PROSTATE CANCER  . Hyperlipidemia   . Hypertension   . Mild memory disturbance    per dr Jannifer Franklin note (neurologist)  . Nocturia more than twice per night   . Nocturnal leg cramps 03/23/2017  . OA (osteoarthritis)   . OSA (obstructive sleep apnea)    moderate osa per study 02-12-2017  cpap recommended--- per pt never heard back from doctor for obtaining cpap  . Pneumonia 02/2018  . Prostate cancer [facility]) urologist-  dr ottelin/  oncologist-  dr Tammi Klippel   dx 10-10-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, Gleason 3+4, PSA 9.02, vol 78cc started ADT to decrease size;  08/ 2018 prostate decrease size vol 46cc and PSA 0.36--- scheduled for radiative prostate seed implants    Past Surgical History:  Procedure Laterality Date  . CATARACT EXTRACTION W/ INTRAOCULAR LENS  IMPLANT, BILATERAL  2016  . COLONOSCOPY  last one 08-08-2013  . CYSTOSCOPY N/A 08/10/2017   Procedure: CYSTOSCOPY FLEXIBLE;  Surgeon: Kathie Rhodes, MD;  Location:   SURGERY CENTER;  Service: Urology;  Laterality: N/A;  NO SEEDS FOUND IN BLADDER  . HAND SURGERY Left 1992  . KNEE ARTHROSCOPY Bilateral 1960's and 70's  . PROSTATE BIOPSY  10-10-2016   dr Karsten Ro office  . RADIOACTIVE SEED IMPLANT N/A 08/10/2017   Procedure: RADIOACTIVE SEED IMPLANT/BRACHYTHERAPY IMPLANT;  Surgeon: Kathie Rhodes, MD;  Location: [facility];  Service: Urology;  Laterality: N/A;     64   SEEDS IMPLANTED  . ROTATOR CUFF REPAIR Right 2004  . SPACE OAR INSTILLATION N/A 08/10/2017   Procedure: SPACE OAR INSTILLATION;  Surgeon: Kathie Rhodes, MD;  Location: [facility];  Service: Urology;  Laterality: N/A;  . TOE SURGERY Left 1970   little toe  . TOTAL KNEE ARTHROPLASTY Bilateral 01-25-2009   dr Wynelle Link [facility]    Social History   Socioeconomic History  . Marital status: Married    Spouse name: [name]  . Number of children: 3  . Years of education: BA  . Highest education level: Not on file  Occupational History  . Occupation: Retired  Scientific laboratory technician  . Financial resource strain: Not on file  . Food insecurity    Worry: Not on file    Inability: Not on file  . Transportation needs    Medical: Not on file    Non-medical: Not on file  Tobacco Use  . Smoking status: Never Smoker  . Smokeless tobacco: Never Used  Substance and Sexual Activity  . Alcohol use: Yes    Comment: rare  . Drug use: No  . Sexual activity: Yes    Partners: Female    Comment: vasectomy  Lifestyle  . Physical activity    Days per week: Not on file    Minutes per session: Not on file  . Stress: Not on file  Relationships  . Social Herbalist on phone: Not on file    Gets together: Not on file    Attends religious service: Not on file    Active member of club or organization: Not on file    Attends meetings of clubs or organizations: Not on file    Relationship status: Not on file  . Intimate partner violence    Fear of current or ex partner: Not on file    Emotionally abused: Not on file    Physically abused: Not on file    Forced sexual activity: Not on file  Other Topics Concern  . Not on file  Social History Narrative   Lives at home w/ his wife, patient has left his wife and is now staying with his son [name] as of 03/19/2019   Right-handed   Caffeine: none    Outpatient Encounter Medications as of 05/01/2019  Medication Sig  . Cholecalciferol  (VITAMIN D PO) Take 1 capsule by mouth daily.   Marland Kitchen doxazosin (CARDURA) 2 MG tablet TAKE 1 TABLET BY MOUTH EVERY DAY (Patient taking differently: Take 2 mg by mouth daily. )  . FIBER FORMULA PO Take 3 capsules by mouth 2 (two) times daily.  . memantine (NAMENDA) 5 MG tablet Take 1 tablet (5 mg total) by mouth 2 (two) times daily.  Marland Kitchen spironolactone (ALDACTONE) 50 MG tablet Take 1 tablet (50 mg total) by mouth daily.  Marland Kitchen tolterodine (DETROL LA) 4 MG 24 hr capsule Take 4 mg by mouth at bedtime.   . valsartan (DIOVAN) 320 MG tablet Take 1 tablet (320 mg total) by mouth daily.  . [DISCONTINUED] amLODipine (NORVASC)  10 MG tablet Take 1 tablet (10 mg total) by mouth daily.  Marland Kitchen amLODipine (NORVASC) 5 MG tablet Take 1 tablet (5 mg total) by mouth daily.  . meclizine (ANTIVERT) 12.5 MG tablet Take 1 tablet (12.5 mg total) by mouth 3 (three) times daily as needed for dizziness.  . [DISCONTINUED] acetaminophen (TYLENOL) 500 MG tablet Take 1,000 mg by mouth every 6 (six) hours as needed for moderate pain.  . [DISCONTINUED] fluticasone (FLONASE) 50 MCG/ACT nasal spray One to 2 sprays each nostril at bedtime and stay on this medication for that at least the next 4-6 weeks  . [DISCONTINUED] meclizine (ANTIVERT) 25 MG tablet Take 25 mg by mouth 3 (three) times daily with meals.  . [DISCONTINUED] potassium chloride SA (K-DUR) 20 MEQ tablet Take 1 tablet (20 mEq total) by mouth daily.   No facility-administered encounter medications on file as of 05/01/2019.     No Known Allergies  Review of Systems  Constitutional: Positive for activity change and fatigue. Negative for appetite change, chills, diaphoresis, fever and unexpected weight change.  HENT: Negative for congestion and sore throat.   Eyes: Negative for photophobia and visual disturbance.  Respiratory: Positive for cough (chronic). Negative for chest tightness and shortness of breath.   Cardiovascular: Negative for chest pain, palpitations and leg swelling.   Gastrointestinal: Negative for abdominal pain, anal bleeding, anorexia, blood in stool, change in bowel habit, diarrhea, nausea, rectal pain and vomiting.  Genitourinary: Negative for decreased urine volume, difficulty urinating and hematuria.  Musculoskeletal: Negative for arthralgias, joint swelling, myalgias and neck pain.  Skin: Negative for color change and rash.  Neurological: Positive for dizziness, vertigo and weakness (generalized). Negative for tremors, seizures, syncope, facial asymmetry, speech difficulty, light-headedness, numbness and headaches.  Psychiatric/Behavioral: Positive for confusion (baseline).  All other systems reviewed and are negative.       Objective:  BP 100/69   Pulse 71   Temp (!) 96.8 F (36 C)   Ht _0  (1.702 m)   Wt 196 lb (88.9 kg)   BMI 30.70 kg/m    Wt Readings from Last 3 Encounters:  05/01/19 196 lb (88.9 kg)  12/11/18 212 lb (96.2 kg)  12/07/18 214 lb (97.1 kg)    Physical Exam Vitals signs and nursing note reviewed.  Constitutional:      General: He is not in acute distress.    Appearance: Normal appearance. He is well-developed and well-groomed. He is not ill-appearing, toxic-appearing or diaphoretic.  HENT:     Head: Normocephalic and atraumatic.     Jaw: There is normal jaw occlusion.     Right Ear: Hearing, tympanic membrane, ear canal and external ear normal.     Left Ear: Hearing, tympanic membrane, ear canal and external ear normal.     Nose: Nose normal.     Mouth/Throat:     Lips: Pink.     Mouth: Mucous membranes are moist.     Pharynx: Oropharynx is clear. Uvula midline.  Eyes:     General: Lids are normal.     Extraocular Movements: Extraocular movements intact.     Conjunctiva/sclera: Conjunctivae normal.     Pupils: Pupils are equal, round, and reactive to light.  Neck:     Musculoskeletal: Normal range of motion and neck supple.     Thyroid: No thyroid mass, thyromegaly or thyroid tenderness.     Vascular:  No carotid bruit or JVD.     Trachea: Trachea and phonation normal.  Cardiovascular:  Rate and Rhythm: Normal rate and regular rhythm.     Chest Wall: PMI is not displaced.     Pulses: Normal pulses.     Heart sounds: Normal heart sounds. No murmur. No friction rub. No gallop.   Pulmonary:     Effort: Pulmonary effort is normal. No respiratory distress.     Breath sounds: Normal breath sounds. No wheezing.  Abdominal:     General: Bowel sounds are normal. There is no distension or abdominal bruit.     Palpations: Abdomen is soft. There is no hepatomegaly or splenomegaly.     Tenderness: There is no abdominal tenderness. There is no right CVA tenderness or left CVA tenderness.     Hernia: No hernia is present.  Musculoskeletal: Normal range of motion.     Right lower leg: No edema.     Left lower leg: No edema.  Lymphadenopathy:     Cervical: No cervical adenopathy.  Skin:    General: Skin is warm and dry.     Capillary Refill: Capillary refill takes less than 2 seconds.     Coloration: Skin is not cyanotic, jaundiced or pale.     Findings: No rash.  Neurological:     General: No focal deficit present.     Mental Status: He is alert and oriented to person, place, and time. Mental status is at baseline.     Cranial Nerves: Cranial nerves are intact. No cranial nerve deficit.     Sensory: Sensation is intact. No sensory deficit.     Motor: Motor function is intact. No weakness.     Coordination: Romberg sign positive. Coordination normal.     Gait: Gait is intact. Gait normal.     Deep Tendon Reflexes: Reflexes are normal and symmetric. Reflexes normal.  Psychiatric:        Attention and Perception: Attention and perception normal.        Mood and Affect: Mood and affect normal.        Speech: Speech normal.        Behavior: Behavior normal. Behavior is cooperative.        Thought Content: Thought content normal.        Judgment: Judgment normal.     Results for orders  placed or performed during the hospital encounter of 02/58/52  Basic metabolic panel  Result Value Ref Range   Sodium 140 135 - 145 mmol/L   Potassium 3.3 (L) 3.5 - 5.1 mmol/L   Chloride 107 98 - 111 mmol/L   CO2 23 22 - 32 mmol/L   Glucose, Bld 106 (H) 70 - 99 mg/dL   BUN 14 8 - 23 mg/dL   Creatinine, Ser 1.01 0.61 - 1.24 mg/dL   Calcium 9.8 8.9 - 10.3 mg/dL   GFR calc non Af Amer >60 >60 mL/min   GFR calc Af Amer >60 >60 mL/min   Anion gap 10 5 - 15  CBC  Result Value Ref Range   WBC 4.6 4.0 - 10.5 K/uL   RBC 5.07 4.22 - 5.81 MIL/uL   Hemoglobin 14.2 13.0 - 17.0 g/dL   HCT 41.7 39.0 - 52.0 %   MCV 82.2 80.0 - 100.0 fL   MCH 28.0 26.0 - 34.0 pg   MCHC 34.1 30.0 - 36.0 g/dL   RDW 13.1 11.5 - 15.5 %   Platelets 223 150 - 400 K/uL   nRBC 0.0 0.0 - 0.2 %       Pertinent labs & imaging  results that were available during my care of the patient were reviewed by me and considered in my medical decision making.  Assessment & Plan:  Ricky [name] was seen today for fatigue and dizziness.  Diagnoses and all orders for this visit:  Essential hypertension BP 100/69 in office today without medications.  Reported symptoms concerning for low blood pressure due to medications. Pt will start taking Cardura at night for his BPH. Will cut amlodipine back to 5 mg daily. BP log provided to pt. Pt to check BP at different times daily and report any persistent high or low readings. Follow up in 2 weeks.  -     amLODipine (NORVASC) 5 MG tablet; Take 1 tablet (5 mg total) by mouth daily.  Dizziness Weakness Vertigo Other fatigue Symptoms concerning for low blood pressure due to medications. Medications adjusted today. No red flags concerning for acute neurovascular event. Romberg was positive, will trial low dose meclizine to see if beneficial. Labs pending. Report any new or worsening symptoms. Pt and son aware of symptoms that require emergent evaluation and treatment.  -     meclizine (ANTIVERT)  12.5 MG tablet; Take 1 tablet (12.5 mg total) by mouth 3 (three) times daily as needed for dizziness. -     CMP14+EGFR -     CBC with Differential/Platelet -     Vitamin B12 -     VITAMIN D 25 Hydroxy (Vit-D Deficiency, Fractures)   Continue all other maintenance medications.  Follow up plan: Return in about 2 weeks (around 05/15/2019), or if symptoms worsen or fail to improve, for dizziness.   The above assessment and management plan was discussed with the patient. The patient verbalized understanding of and has agreed to the management plan. Patient is aware to call the clinic if symptoms persist or worsen. Patient is aware when to return to the clinic for a follow-up visit. Patient educated on when it is appropriate to go to the emergency department.   Monia Pouch, FNP-C [facility] [phone] 05/01/19

## 2019-05-01 NOTE — Patient Instructions (Signed)
Norvasc 5 mg daily. Doxazosin at night. BP monitoring, report highs or lows. Follow up in 2 weeks.

## 2019-05-02 ENCOUNTER — Other Ambulatory Visit: Payer: Self-pay

## 2019-05-02 ENCOUNTER — Encounter (HOSPITAL_COMMUNITY): Payer: Self-pay | Admitting: Emergency Medicine

## 2019-05-02 ENCOUNTER — Emergency Department (HOSPITAL_COMMUNITY): Payer: Medicare Other

## 2019-05-02 ENCOUNTER — Emergency Department (HOSPITAL_COMMUNITY)
Admission: EM | Admit: 2019-05-02 | Discharge: 2019-05-02 | Disposition: A | Discharge status: Home / Self Care | Payer: Medicare Other | Attending: Emergency Medicine | Admitting: Emergency Medicine

## 2019-05-02 DIAGNOSIS — Z96653 Presence of artificial knee joint, bilateral: Secondary | ICD-10-CM | POA: Diagnosis not present

## 2019-05-02 DIAGNOSIS — R7989 Other specified abnormal findings of blood chemistry: Secondary | ICD-10-CM | POA: Insufficient documentation

## 2019-05-02 DIAGNOSIS — C61 Malignant neoplasm of prostate: Secondary | ICD-10-CM | POA: Insufficient documentation

## 2019-05-02 DIAGNOSIS — Z79899 Other long term (current) drug therapy: Secondary | ICD-10-CM | POA: Diagnosis not present

## 2019-05-02 DIAGNOSIS — R42 Dizziness and giddiness: Secondary | ICD-10-CM | POA: Diagnosis not present

## 2019-05-02 DIAGNOSIS — R4182 Altered mental status, unspecified: Secondary | ICD-10-CM | POA: Insufficient documentation

## 2019-05-02 DIAGNOSIS — Z20828 Contact with and (suspected) exposure to other viral communicable diseases: Secondary | ICD-10-CM | POA: Diagnosis not present

## 2019-05-02 DIAGNOSIS — R944 Abnormal results of kidney function studies: Secondary | ICD-10-CM | POA: Diagnosis not present

## 2019-05-02 DIAGNOSIS — I10 Essential (primary) hypertension: Secondary | ICD-10-CM | POA: Diagnosis not present

## 2019-05-02 DIAGNOSIS — R4020 Unspecified coma: Secondary | ICD-10-CM | POA: Diagnosis not present

## 2019-05-02 DIAGNOSIS — R531 Weakness: Secondary | ICD-10-CM | POA: Diagnosis not present

## 2019-05-02 DIAGNOSIS — R748 Abnormal levels of other serum enzymes: Secondary | ICD-10-CM

## 2019-05-02 LAB — CBC WITH DIFFERENTIAL/PLATELET
Abs Immature Granulocytes: 0.02 10*3/uL (ref 0.00–0.07)
Basophils Absolute: 0 10*3/uL (ref 0.0–0.2)
Basophils Absolute: 0 10*3/uL (ref 0.0–0.1)
Basophils Relative: 0 %
Basos: 0 %
EOS (ABSOLUTE): 0.1 10*3/uL (ref 0.0–0.4)
Eos: 2 %
Eosinophils Absolute: 0.1 10*3/uL (ref 0.0–0.5)
Eosinophils Relative: 3 %
HCT: 34.3 % — ABNORMAL LOW (ref 39.0–52.0)
Hematocrit: 36.4 % — ABNORMAL LOW (ref 37.5–51.0)
Hemoglobin: 12.5 g/dL — ABNORMAL LOW (ref 13.0–17.7)
Hemoglobin: 11.8 g/dL — ABNORMAL LOW (ref 13.0–17.0)
Immature Grans (Abs): 0 10*3/uL (ref 0.0–0.1)
Immature Granulocytes: 0 %
Immature Granulocytes: 1 %
Lymphocytes Absolute: 1.3 10*3/uL (ref 0.7–3.1)
Lymphocytes Relative: 21 %
Lymphs Abs: 0.9 10*3/uL (ref 0.7–4.0)
Lymphs: 21 %
MCH: 29.3 pg (ref 26.6–33.0)
MCH: 29.1 pg (ref 26.0–34.0)
MCHC: 34.3 g/dL (ref 31.5–35.7)
MCHC: 34.4 g/dL (ref 30.0–36.0)
MCV: 85 fL (ref 79–97)
MCV: 84.5 fL (ref 80.0–100.0)
Monocytes Absolute: 0.7 10*3/uL (ref 0.1–0.9)
Monocytes Absolute: 0.6 10*3/uL (ref 0.1–1.0)
Monocytes Relative: 13 %
Monocytes: 11 %
Neutro Abs: 2.7 10*3/uL (ref 1.7–7.7)
Neutrophils Absolute: 4 10*3/uL (ref 1.4–7.0)
Neutrophils Relative %: 62 %
Neutrophils: 66 %
Platelets: 200 10*3/uL (ref 150–450)
Platelets: 176 10*3/uL (ref 150–400)
RBC: 4.26 x10E6/uL (ref 4.14–5.80)
RBC: 4.06 MIL/uL — ABNORMAL LOW (ref 4.22–5.81)
RDW: 13.3 % (ref 11.6–15.4)
RDW: 12.8 % (ref 11.5–15.5)
WBC: 6.1 10*3/uL (ref 3.4–10.8)
WBC: 4.4 10*3/uL (ref 4.0–10.5)
nRBC: 0 % (ref 0.0–0.2)

## 2019-05-02 LAB — URINALYSIS, ROUTINE W REFLEX MICROSCOPIC
Bilirubin Urine: NEGATIVE
Glucose, UA: NEGATIVE mg/dL
Hgb urine dipstick: NEGATIVE
Ketones, ur: NEGATIVE mg/dL
Leukocytes,Ua: NEGATIVE
Nitrite: NEGATIVE
Protein, ur: NEGATIVE mg/dL
Specific Gravity, Urine: 1.009 (ref 1.005–1.030)
pH: 6 (ref 5.0–8.0)

## 2019-05-02 LAB — CMP14+EGFR
ALT: 12 IU/L (ref 0–44)
AST: 13 IU/L (ref 0–40)
Albumin/Globulin Ratio: 2.4 — ABNORMAL HIGH (ref 1.2–2.2)
Albumin: 4.4 g/dL (ref 3.7–4.7)
Alkaline Phosphatase: 72 IU/L (ref 39–117)
BUN/Creatinine Ratio: 18 (ref 10–24)
BUN: 39 mg/dL — ABNORMAL HIGH (ref 8–27)
Bilirubin Total: 0.3 mg/dL (ref 0.0–1.2)
CO2: 20 mmol/L (ref 20–29)
Calcium: 10.3 mg/dL — ABNORMAL HIGH (ref 8.6–10.2)
Chloride: 103 mmol/L (ref 96–106)
Creatinine, Ser: 2.19 mg/dL — ABNORMAL HIGH (ref 0.76–1.27)
GFR calc Af Amer: 32 mL/min/{1.73_m2} — ABNORMAL LOW (ref >59)
GFR calc non Af Amer: 28 mL/min/{1.73_m2} — ABNORMAL LOW (ref >59)
Globulin, Total: 1.8 g/dL (ref 1.5–4.5)
Glucose: 85 mg/dL (ref 65–99)
Potassium: 4.5 mmol/L (ref 3.5–5.2)
Sodium: 137 mmol/L (ref 134–144)
Total Protein: 6.2 g/dL (ref 6.0–8.5)

## 2019-05-02 LAB — COMPREHENSIVE METABOLIC PANEL
ALT: 16 U/L (ref 0–44)
AST: 15 U/L (ref 15–41)
Albumin: 3.9 g/dL (ref 3.5–5.0)
Alkaline Phosphatase: 57 U/L (ref 38–126)
Anion gap: 8 (ref 5–15)
BUN: 37 mg/dL — ABNORMAL HIGH (ref 8–23)
CO2: 23 mmol/L (ref 22–32)
Calcium: 9.3 mg/dL (ref 8.9–10.3)
Chloride: 107 mmol/L (ref 98–111)
Creatinine, Ser: 2 mg/dL — ABNORMAL HIGH (ref 0.61–1.24)
GFR calc Af Amer: 36 mL/min — ABNORMAL LOW (ref >60)
GFR calc non Af Amer: 31 mL/min — ABNORMAL LOW (ref >60)
Glucose, Bld: 96 mg/dL (ref 70–99)
Potassium: 4.1 mmol/L (ref 3.5–5.1)
Sodium: 138 mmol/L (ref 135–145)
Total Bilirubin: 0.6 mg/dL (ref 0.3–1.2)
Total Protein: 6 g/dL — ABNORMAL LOW (ref 6.5–8.1)

## 2019-05-02 LAB — THYROID PANEL WITH TSH
Free Thyroxine Index: 2.3 (ref 1.2–4.9)
T3 Uptake Ratio: 31 % (ref 24–39)
T4, Total: 7.4 ug/dL (ref 4.5–12.0)
TSH: 1.63 u[IU]/mL (ref 0.450–4.500)

## 2019-05-02 LAB — SARS CORONAVIRUS 2 BY RT PCR (HOSPITAL ORDER, PERFORMED IN ~~LOC~~ HOSPITAL LAB): SARS Coronavirus 2: NEGATIVE

## 2019-05-02 MED ORDER — SODIUM CHLORIDE 0.9 % IV BOLUS
1000.0000 mL | Freq: Once | INTRAVENOUS | Status: AC
  Administered 2019-05-02: 1000 mL via INTRAVENOUS

## 2019-05-02 NOTE — ED Provider Notes (Signed)
[facility] EMERGENCY DEPARTMENT Provider Note   CSN: [id number] Arrival date & time: 05/02/19  6010     History   Chief Complaint Chief Complaint  Patient presents with   Abnormal Lab    HPI Ricky [name] is a 78 y.o. male.     Patient has been having weakness lately and went to his family doctor yesterday.  Labs came back today and show his creatinine is elevating.  So he was sent to the emergency department here  The history is provided by the patient. No language interpreter was used.  Weakness Severity:  Moderate Onset quality:  Gradual Timing:  Constant Progression:  Worsening Chronicity:  New Context: not alcohol use   Relieved by:  Nothing Worsened by:  Nothing Ineffective treatments:  None tried Associated symptoms: no abdominal pain, no chest pain, no cough, no diarrhea, no frequency, no headaches and no seizures     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)    Family history of prostate cancer    GERD (gastroesophageal reflux disease)    History of adenomatous polyp of colon    2008; 2011   History of exercise stress test    01-14-2009 by dr hochrein-- normal w/ no ischemia   Hot flashes    DUE TO HORMONE THERAPY FOR PROSTATE CANCER   Hyperlipidemia    Hypertension    Mild memory disturbance    per dr Jannifer Franklin note (neurologist)   Nocturia more than twice per night    Nocturnal leg cramps 03/23/2017   OA (osteoarthritis)    OSA (obstructive sleep apnea)    moderate osa per study 02-12-2017  cpap recommended--- per pt never heard back from doctor for obtaining cpap   Pneumonia 02/2018   Prostate cancer [facility]) urologist-  dr ottelin/  oncologist-  dr Tammi Klippel   dx 10-10-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, Gleason 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, PSA 9.02, vol 78cc started ADT to decrease size;  08/ 2018 prostate decrease size vol 46cc and PSA 0.36--- scheduled for radiative prostate  seed implants    Patient Active Problem List   Diagnosis Date Noted   Vertigo 05/01/2019   Sleep apnea in adult 04/08/2017   Nocturnal leg cramps 03/23/2017   Genetic testing 12/29/2016   Family history of breast cancer 12/13/2016   Family history of prostate cancer    Malignant neoplasm prostate ([place]) 11/13/2016   Memory difficulty 09/22/2016   Headache syndrome 09/22/2016   Chronic cough 05/24/2016   Laryngopharyngeal reflux (LPR) 02/03/2016   Cough variant asthma vs UACS  11/12/2015   Hyperlipidemia 10/15/2015   Vitamin D deficiency 03/12/2014   BPH (benign prostatic hyperplasia) 10/01/2013   Erectile dysfunction 10/01/2013   PERSONAL HX COLONIC POLYPS 01/25/2010   KNEE REPLACEMENT, BILATERAL, HX OF 01/25/2010   Essential hypertension 01/13/2009    Past Surgical History:  Procedure Laterality Date   CATARACT EXTRACTION W/ INTRAOCULAR LENS  IMPLANT, BILATERAL  2016   COLONOSCOPY  last one 08-08-2013   CYSTOSCOPY N/A 08/10/2017   Procedure: CYSTOSCOPY FLEXIBLE;  Surgeon: Kathie Rhodes, MD;  Location: [facility];  Service: Urology;  Laterality: N/A;  NO SEEDS FOUND IN BLADDER   HAND SURGERY Left 1992   KNEE ARTHROSCOPY Bilateral 1960's and 70's   PROSTATE BIOPSY  10-10-2016   dr Karsten Ro office   RADIOACTIVE SEED IMPLANT N/A 08/10/2017   Procedure: RADIOACTIVE SEED IMPLANT/BRACHYTHERAPY IMPLANT;  Surgeon:  Kathie Rhodes, MD;  Location: [facility];  Service: Urology;  Laterality: N/A;    64   SEEDS IMPLANTED   ROTATOR CUFF REPAIR Right 2004   SPACE OAR INSTILLATION N/A 08/10/2017   Procedure: SPACE OAR INSTILLATION;  Surgeon: Kathie Rhodes, MD;  Location: [facility];  Service: Urology;  Laterality: N/A;   TOE SURGERY Left 1970   little toe   TOTAL KNEE ARTHROPLASTY Bilateral 01-25-2009   dr Wynelle Link [facility]        Home Medications    Prior to Admission medications   Medication Sig Start Date End  Date Taking? Authorizing Provider  amLODipine (NORVASC) 5 MG tablet Take 1 tablet (5 mg total) by mouth daily. Patient taking differently: Take 10 mg by mouth daily.  05/01/19  Yes Rakes, Connye Burkitt, FNP  Cholecalciferol (VITAMIN D PO) Take 1 capsule by mouth daily.    Yes [provider]  doxazosin (CARDURA) 2 MG tablet TAKE 1 TABLET BY MOUTH EVERY DAY Patient taking differently: Take 2 mg by mouth daily.  11/15/18  Yes Chipper Herb, MD  FIBER FORMULA PO Take 3 capsules by mouth 2 (two) times daily.   Yes [provider]  meclizine (ANTIVERT) 12.5 MG tablet Take 1 tablet (12.5 mg total) by mouth 3 (three) times daily as needed for dizziness. 05/01/19  Yes Rakes, Connye Burkitt, FNP  memantine (NAMENDA) 5 MG tablet Take 1 tablet (5 mg total) by mouth 2 (two) times daily. 10/14/18  Yes Ward Givens, NP  spironolactone (ALDACTONE) 50 MG tablet Take 1 tablet (50 mg total) by mouth daily. 03/19/19  Yes Chipper Herb, MD  tolterodine (DETROL LA) 4 MG 24 hr capsule Take 4 mg by mouth at bedtime.  10/24/16  Yes [provider]  valsartan (DIOVAN) 320 MG tablet Take 1 tablet (320 mg total) by mouth daily. 02/26/19  Yes Chipper Herb, MD  losartan (COZAAR) 100 MG tablet Take 100 mg by mouth daily.    [provider]    Family History Family History  Problem Relation Age of Onset   Healthy Mother    Prostate cancer Father        Dx 37s; Deceased 14   Prostate cancer Brother 15       currently 37   Alcohol abuse Brother    Bipolar disorder Brother    Prostate cancer Paternal Uncle        unsure of age; possibly 2nd uncle also has prostate ca   Prostate cancer Maternal Uncle        unsure of age   Breast cancer Maternal Aunt        unsure of age   Colon cancer Neg Hx    Esophageal cancer Neg Hx    Rectal cancer Neg Hx    Stomach cancer Neg Hx     Social History Social History   Tobacco Use   Smoking status: Never Smoker   Smokeless tobacco: Never  Used  Substance Use Topics   Alcohol use: Yes    Comment: rare   Drug use: No     Allergies   Patient has no known allergies.   Review of Systems Review of Systems  Constitutional: Negative for appetite change and fatigue.  HENT: Negative for congestion, ear discharge and sinus pressure.   Eyes: Negative for discharge.  Respiratory: Negative for cough.   Cardiovascular: Negative for chest pain.  Gastrointestinal: Negative for abdominal pain and diarrhea.  Genitourinary: Negative  for frequency and hematuria.  Musculoskeletal: Negative for back pain.  Skin: Negative for rash.  Neurological: Positive for weakness. Negative for seizures and headaches.  Psychiatric/Behavioral: Negative for hallucinations.     Physical Exam Updated Vital Signs BP 138/77    Pulse 67    Temp 97.9 F (36.6 C) (Oral)    Resp 19    Ht 5\' 7"  (1.702 m)    Wt 90.7 kg    SpO2 100%    BMI 31.32 kg/m   Physical Exam Vitals signs and nursing note reviewed.  Constitutional:      Appearance: He is well-developed.  HENT:     Head: Normocephalic.     Nose: Nose normal.  Eyes:     General: No scleral icterus.    Conjunctiva/sclera: Conjunctivae normal.  Neck:     Musculoskeletal: Neck supple.     Thyroid: No thyromegaly.  Cardiovascular:     Rate and Rhythm: Normal rate and regular rhythm.     Heart sounds: No murmur. No friction rub. No gallop.   Pulmonary:     Breath sounds: No stridor. No wheezing or rales.  Chest:     Chest wall: No tenderness.  Abdominal:     General: There is no distension.     Tenderness: There is no abdominal tenderness. There is no rebound.  Musculoskeletal: Normal range of motion.  Lymphadenopathy:     Cervical: No cervical adenopathy.  Skin:    Findings: No erythema or rash.  Neurological:     Mental Status: He is oriented to person, place, and time.     Motor: No abnormal muscle tone.     Coordination: Coordination normal.  Psychiatric:        Behavior:  Behavior normal.      ED Treatments / Results  Labs (all labs ordered are listed, but only abnormal results are displayed) Labs Reviewed  CBC WITH DIFFERENTIAL/PLATELET - Abnormal; Notable for the following components:      Result Value   RBC 4.06 (*)    Hemoglobin 11.8 (*)    HCT 34.3 (*)    All other components within normal limits  COMPREHENSIVE METABOLIC PANEL - Abnormal; Notable for the following components:   BUN 37 (*)    Creatinine, Ser 2.00 (*)    Total Protein 6.0 (*)    GFR calc non Af Amer 31 (*)    GFR calc Af Amer 36 (*)    All other components within normal limits  URINALYSIS, ROUTINE W REFLEX MICROSCOPIC - Abnormal; Notable for the following components:   Color, Urine STRAW (*)    All other components within normal limits  SARS CORONAVIRUS 2 (HOSPITAL ORDER, [place] [facility])    EKG None  Radiology Ct Head Wo Contrast  Result Date: 05/02/2019 CLINICAL DATA:  Altered level of consciousness. Generalized weakness, fatigue and dizziness. EXAM: CT HEAD WITHOUT CONTRAST TECHNIQUE: Contiguous axial images were obtained from the base of the skull through the vertex without intravenous contrast. COMPARISON:  Brain MRI, 12/18/2018. FINDINGS: Brain: No evidence of acute infarction, hemorrhage, hydrocephalus, extra-axial collection or mass lesion/mass effect. Ventricular and sulcal enlargement noted reflecting mild generalized atrophy. Patchy areas of white matter hypoattenuation are present consistent with moderate chronic microvascular ischemic change. Vascular: No hyperdense vessel or unexpected calcification. Skull: Normal. Negative for fracture or focal lesion. Sinuses/Orbits: Globes and orbits are unremarkable. Visualized sinuses and mastoid air cells are clear. Other: None. IMPRESSION: 1. No acute intracranial abnormalities. 2. Mild  atrophy and chronic microvascular ischemic change stable compared to the prior brain MRI. Electronically Signed   By:  Lajean Manes M.D.   On: 05/02/2019 10:59   Dg Chest Portable 1 View  Result Date: 05/02/2019 CLINICAL DATA:  Weakness.  History of prostate cancer. EXAM: PORTABLE CHEST 1 VIEW COMPARISON:  12/18/2018 FINDINGS: Lungs are adequately inflated and otherwise clear. Stable cardiomegaly. Remainder of the exam is unchanged. IMPRESSION: No acute cardiopulmonary disease. Stable cardiomegaly. Electronically Signed   By: Marin Olp M.D.   On: 05/02/2019 10:32    Procedures Procedures (including critical care time)  Medications Ordered in ED Medications  sodium chloride 0.9 % bolus 1,000 mL ( Intravenous Stopped 05/02/19 1122)     Initial Impression / Assessment and Plan / ED Course  I have reviewed the triage vital signs and the nursing notes.  Pertinent labs & imaging results that were available during my care of the patient were reviewed by me and considered in my medical decision making (see chart for details).       Patient mild elevation of creatinine at 2.0.  I called his nephrologist Dr. Baird Cancer and we are going to stop the Cardura and cut the valsartan in half so he is only taking 160 mg a day.  And he will follow-up with his nephrologist next week  Final Clinical Impressions(s) / ED Diagnoses   Final diagnoses:  Elevated creatine kinase    ED Discharge Orders    None       Milton Ferguson, MD 05/02/19 1352

## 2019-05-02 NOTE — Discharge Instructions (Addendum)
Take half of your diovan or called valsartan each day.  So you will be taking 160 mg.  You can cut it in half.   Stop taking the cardura or doxazosin.  Follow up with your nephrologist as planned

## 2019-05-02 NOTE — ED Triage Notes (Signed)
PCP called and says pts Kidney functions were abnormal.  Denies any pain at this time.

## 2019-05-05 ENCOUNTER — Ambulatory Visit: Payer: Medicare Other | Admitting: Adult Health

## 2019-05-07 DIAGNOSIS — I10 Essential (primary) hypertension: Secondary | ICD-10-CM | POA: Diagnosis not present

## 2019-05-08 DIAGNOSIS — I10 Essential (primary) hypertension: Secondary | ICD-10-CM | POA: Diagnosis not present

## 2019-05-08 DIAGNOSIS — E876 Hypokalemia: Secondary | ICD-10-CM | POA: Diagnosis not present

## 2019-05-08 DIAGNOSIS — E269 Hyperaldosteronism, unspecified: Secondary | ICD-10-CM | POA: Diagnosis not present

## 2019-05-08 DIAGNOSIS — N179 Acute kidney failure, unspecified: Secondary | ICD-10-CM | POA: Diagnosis not present

## 2019-05-13 ENCOUNTER — Other Ambulatory Visit: Payer: Self-pay | Admitting: Family Medicine

## 2019-05-13 DIAGNOSIS — Z8546 Personal history of malignant neoplasm of prostate: Secondary | ICD-10-CM | POA: Diagnosis not present

## 2019-05-14 ENCOUNTER — Other Ambulatory Visit: Payer: Self-pay

## 2019-05-15 ENCOUNTER — Other Ambulatory Visit: Payer: Self-pay

## 2019-05-15 ENCOUNTER — Ambulatory Visit (INDEPENDENT_AMBULATORY_CARE_PROVIDER_SITE_OTHER): Payer: Medicare Other | Admitting: Family Medicine

## 2019-05-15 ENCOUNTER — Encounter: Payer: Self-pay | Admitting: Family Medicine

## 2019-05-15 VITALS — BP 129/83 | HR 72 | Temp 97.4°F | Ht 67.0 in | Wt 200.0 lb

## 2019-05-15 DIAGNOSIS — I10 Essential (primary) hypertension: Secondary | ICD-10-CM | POA: Diagnosis not present

## 2019-05-15 DIAGNOSIS — R42 Dizziness and giddiness: Secondary | ICD-10-CM | POA: Diagnosis not present

## 2019-05-15 DIAGNOSIS — N289 Disorder of kidney and ureter, unspecified: Secondary | ICD-10-CM

## 2019-05-15 DIAGNOSIS — D649 Anemia, unspecified: Secondary | ICD-10-CM

## 2019-05-15 MED ORDER — AMLODIPINE BESYLATE 5 MG PO TABS: 5.0000 mg | ORAL_TABLET | Freq: Every day | ORAL | 3 refills | Status: DC

## 2019-05-15 NOTE — Progress Notes (Signed)
Subjective:  Patient ID: Ricky [name], male    DOB: 12/27/1940, 78 y.o.   MRN: [id number]  Patient Care Team: Baruch Gouty, FNP as PCP - General (Family Medicine) Celene Squibb, MD as Consulting Physician (Dermatology) Tanda Rockers, MD as Consulting Physician (Pulmonary Disease) Kathie Rhodes, MD as Consulting Physician (Urology) Rutherford Guys, MD as Consulting Physician (Ophthalmology)   Chief Complaint:  Medical Management of Chronic Issues (2 week follow up ) and Dizziness   HPI: Ricky [name] is a 78 y.o. male presenting on 05/15/2019 for Medical Management of Chronic Issues (2 week follow up ) and Dizziness   Follow up today for vertigo. Pt states he still has dizziness when getting up fast, bending over, and getting out of bed. No focal deficits, nausea, vomiting, chest pain, shortness of breath, palpitations, or weakness. Baseline confusion.   Dizziness This is a recurrent problem. The current episode started more than 1 month ago. The problem occurs intermittently. The problem has been waxing and waning. Associated symptoms include vertigo. Pertinent negatives include no abdominal pain, anorexia, arthralgias, change in bowel habit, chest pain, chills, congestion, coughing, diaphoresis, fatigue, fever, headaches, joint swelling, myalgias, nausea, neck pain, numbness, rash, sore throat, swollen glands, urinary symptoms, visual change, vomiting or weakness. The symptoms are aggravated by bending, standing and twisting. He has tried nothing for the symptoms.   Creatinine was acutely elevated at [place] office visit. Was sent to ED for IV hydration. Pt went and received 1000 ml NS. Pt denies swelling, weakness, or decreased urination.  CMP Latest Ref Rng & Units 05/02/2019 05/01/2019 12/18/2018  Glucose 70 - 99 mg/dL 96 85 106(H)  BUN 8 - 23 mg/dL 37(H) 39(H) 14  Creatinine 0.61 - 1.24 mg/dL 2.00(H) 2.19(H) 1.01  Sodium 135 - 145 mmol/L 138 137 140  Potassium 3.5 - 5.1 mmol/L 4.1 4.5  3.3(L)  Chloride 98 - 111 mmol/L 107 103 107  CO2 22 - 32 mmol/L '23 20 23  ' Calcium 8.9 - 10.3 mg/dL 9.3 10.3(H) 9.8  Total Protein 6.5 - 8.1 g/dL 6.0(L) 6.2 -  Total Bilirubin 0.3 - 1.2 mg/dL 0.6 0.3 -  Alkaline Phos 38 - 126 U/L 57 72 -  AST 15 - 41 U/L 15 13 -  ALT 0 - 44 U/L 16 12 -     Relevant past medical, surgical, family, and social history reviewed and updated as indicated.  Allergies and medications reviewed and updated. Date reviewed: Chart in Epic.   Past Medical History:  Diagnosis Date  . Chronic cough   . Deviated nasal septum   . 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 Family history of prostate cancer   . GERD (gastroesophageal reflux disease)   . History of adenomatous polyp of colon    2008; 2011  . History of exercise stress test    01-14-2009 by dr hochrein-- normal w/ no ischemia  . Hot flashes    DUE TO HORMONE THERAPY FOR PROSTATE CANCER  . Hyperlipidemia   . Hypertension   . Mild memory disturbance    per dr Jannifer Franklin note (neurologist)  . Nocturia more than twice per night   . Nocturnal leg cramps 03/23/2017  . OA (osteoarthritis)   . OSA (obstructive sleep apnea)    moderate osa per study 02-12-2017  cpap recommended--- per pt never heard back from doctor for obtaining cpap  . Pneumonia 02/2018  . Prostate cancer [facility]) urologist-  dr ottelin/  oncologist-  dr Tammi Klippel   dx 10-10-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, Gleason 3+4, PSA 9.02, vol 78cc started ADT to decrease size;  08/ 2018 prostate decrease size vol 46cc and PSA 0.36--- scheduled for radiative prostate seed implants    Past Surgical History:  Procedure Laterality Date  . CATARACT EXTRACTION W/ INTRAOCULAR LENS  IMPLANT, BILATERAL  2016  . COLONOSCOPY  last one 08-08-2013  . CYSTOSCOPY N/A 08/10/2017   Procedure: CYSTOSCOPY FLEXIBLE;  Surgeon: Kathie Rhodes, MD;  Location: [facility];  Service: Urology;  Laterality: N/A;  NO SEEDS FOUND IN BLADDER  . HAND SURGERY  Left 1992  . KNEE ARTHROSCOPY Bilateral 1960's and 70's  . PROSTATE BIOPSY  10-10-2016   dr Karsten Ro office  . RADIOACTIVE SEED IMPLANT N/A 08/10/2017   Procedure: RADIOACTIVE SEED IMPLANT/BRACHYTHERAPY IMPLANT;  Surgeon: Kathie Rhodes, MD;  Location: [facility];  Service: Urology;  Laterality: N/A;    64   SEEDS IMPLANTED  . ROTATOR CUFF REPAIR Right 2004  . SPACE OAR INSTILLATION N/A 08/10/2017   Procedure: SPACE OAR INSTILLATION;  Surgeon: Kathie Rhodes, MD;  Location: [facility];  Service: Urology;  Laterality: N/A;  . TOE SURGERY Left 1970   little toe  . TOTAL KNEE ARTHROPLASTY Bilateral 01-25-2009   dr Wynelle Link [facility]    Social History   Socioeconomic History  . Marital status: Married    Spouse name: [name]  . Number of children: 3  . Years of education: BA  . Highest education level: Not on file  Occupational History  . Occupation: Retired  Scientific laboratory technician  . Financial resource strain: Not on file  . Food insecurity    Worry: Not on file    Inability: Not on file  . Transportation needs    Medical: Not on file    Non-medical: Not on file  Tobacco Use  . Smoking status: Never Smoker  . Smokeless tobacco: Never Used  Substance and Sexual Activity  . Alcohol use: Yes    Comment: rare  . Drug use: No  . Sexual activity: Yes    Partners: Female    Comment: vasectomy  Lifestyle  . Physical activity    Days per week: Not on file    Minutes per session: Not on file  . Stress: Not on file  Relationships  . Social Herbalist on phone: Not on file    Gets together: Not on file    Attends religious service: Not on file    Active member of club or organization: Not on file    Attends meetings of clubs or organizations: Not on file    Relationship status: Not on file  . Intimate partner violence    Fear of current or ex partner: Not on file    Emotionally abused: Not on file    Physically abused: Not on file    Forced sexual activity:  Not on file  Other Topics Concern  . Not on file  Social History Narrative   Lives at home w/ his wife, patient has left his wife and is now staying with his son [name] as of 03/19/2019   Right-handed   Caffeine: none    Outpatient Encounter Medications as of 05/15/2019  Medication Sig  . amLODipine (NORVASC) 5 MG tablet Take 1 tablet (5 mg total) by mouth daily.  . Cholecalciferol (VITAMIN D PO) Take 1 capsule by mouth daily.   Marland Kitchen FIBER FORMULA PO Take 3 capsules by mouth 2 (  two) times daily.  . memantine (NAMENDA) 5 MG tablet Take 1 tablet (5 mg total) by mouth 2 (two) times daily.  Marland Kitchen spironolactone (ALDACTONE) 50 MG tablet Take 1 tablet (50 mg total) by mouth daily.  Marland Kitchen tolterodine (DETROL LA) 4 MG 24 hr capsule Take 4 mg by mouth at bedtime.   . [DISCONTINUED] amLODipine (NORVASC) 5 MG tablet Take 1 tablet (5 mg total) by mouth daily. (Patient taking differently: Take 10 mg by mouth daily. )  . [DISCONTINUED] losartan (COZAAR) 100 MG tablet Take 100 mg by mouth daily.  . meclizine (ANTIVERT) 12.5 MG tablet Take 1 tablet (12.5 mg total) by mouth 3 (three) times daily as needed for dizziness. (Patient not taking: Reported on 05/15/2019)  . [DISCONTINUED] doxazosin (CARDURA) 2 MG tablet TAKE 1 TABLET BY MOUTH ONCE DAILY (Patient not taking: Reported on 05/15/2019)  . [DISCONTINUED] valsartan (DIOVAN) 320 MG tablet Take 1 tablet (320 mg total) by mouth daily. (Patient not taking: Reported on 05/15/2019)   No facility-administered encounter medications on file as of 05/15/2019.     No Known Allergies  Review of Systems  Constitutional: Negative for activity change, appetite change, chills, diaphoresis, fatigue and fever.  HENT: Negative.  Negative for congestion and sore throat.   Eyes: Negative.  Negative for photophobia and visual disturbance.  Respiratory: Negative for cough, chest tightness and shortness of breath.   Cardiovascular: Negative for chest pain, palpitations and leg swelling.   Gastrointestinal: Negative for abdominal distention, abdominal pain, anal bleeding, anorexia, blood in stool, change in bowel habit, constipation, diarrhea, nausea and vomiting.  Endocrine: Negative.   Genitourinary: Negative for decreased urine volume, difficulty urinating, dysuria, frequency, hematuria and urgency.  Musculoskeletal: Negative for arthralgias, joint swelling, myalgias and neck pain.  Skin: Negative.  Negative for color change, pallor and rash.  Allergic/Immunologic: Negative.   Neurological: Positive for dizziness and vertigo. Negative for weakness, light-headedness, numbness and headaches.  Hematological: Negative.   Psychiatric/Behavioral: Positive for confusion (baseline). Negative for hallucinations, sleep disturbance and suicidal ideas.  All other systems reviewed and are negative.       Objective:  BP 129/83   Pulse 72   Temp (!) 97.4 F (36.3 C)   Ht '5\' 7"'  (1.702 m)   Wt 200 lb (90.7 kg)   BMI 31.32 kg/m    Wt Readings from Last 3 Encounters:  05/15/19 200 lb (90.7 kg)  05/02/19 200 lb (90.7 kg)  05/01/19 196 lb (88.9 kg)    Physical Exam Vitals signs and nursing note reviewed.  Constitutional:      General: He is not in acute distress.    Appearance: Normal appearance. He is well-developed and well-groomed. He is not ill-appearing, toxic-appearing or diaphoretic.  HENT:     Head: Normocephalic and atraumatic.     Jaw: There is normal jaw occlusion.     Right Ear: Hearing normal.     Left Ear: Hearing normal.     Nose: Nose normal.     Mouth/Throat:     Lips: Pink.     Mouth: Mucous membranes are moist.     Pharynx: Oropharynx is clear. Uvula midline.  Eyes:     General: Lids are normal.     Extraocular Movements: Extraocular movements intact.     Conjunctiva/sclera: Conjunctivae normal.     Pupils: Pupils are equal, round, and reactive to light.  Neck:     Musculoskeletal: Normal range of motion and neck supple.     Thyroid: No thyroid  mass, thyromegaly or thyroid tenderness.     Vascular: No carotid bruit or JVD.     Trachea: Trachea and phonation normal.  Cardiovascular:     Rate and Rhythm: Normal rate and regular rhythm.     Chest Wall: PMI is not displaced.     Pulses: Normal pulses.     Heart sounds: Normal heart sounds. No murmur. No friction rub. No gallop.   Pulmonary:     Effort: Pulmonary effort is normal. No respiratory distress.     Breath sounds: Normal breath sounds. No wheezing.  Abdominal:     General: Bowel sounds are normal. There is no distension or abdominal bruit.     Palpations: Abdomen is soft. There is no hepatomegaly or splenomegaly.     Tenderness: There is no abdominal tenderness. There is no right CVA tenderness or left CVA tenderness.     Hernia: No hernia is present.  Musculoskeletal: Normal range of motion.     Right lower leg: No edema.     Left lower leg: No edema.  Lymphadenopathy:     Cervical: No cervical adenopathy.  Skin:    General: Skin is warm and dry.     Capillary Refill: Capillary refill takes less than 2 seconds.     Coloration: Skin is not cyanotic, jaundiced or pale.     Findings: No rash.  Neurological:     General: No focal deficit present.     Mental Status: He is alert. Mental status is at baseline.     Cranial Nerves: Cranial nerves are intact. No cranial nerve deficit.     Sensory: Sensation is intact. No sensory deficit.     Motor: Motor function is intact. No weakness.     Coordination: Romberg sign positive. Coordination normal.     Gait: Gait is intact. Gait normal.     Deep Tendon Reflexes: Reflexes are normal and symmetric. Reflexes normal.  Psychiatric:        Attention and Perception: Attention and perception normal.        Mood and Affect: Mood and affect normal.        Speech: Speech normal.        Behavior: Behavior normal. Behavior is cooperative.        Thought Content: Thought content normal.        Cognition and Memory: Cognition and  memory normal.        Judgment: Judgment normal.     Results for orders placed or performed during the hospital encounter of 05/02/19  SARS Coronavirus 2 (CEPHEID - Performed in [place] hospital lab), [facility] Order   Specimen: Nasopharyngeal Swab  Result Value Ref Range   SARS Coronavirus 2 NEGATIVE NEGATIVE  CBC with Differential/Platelet  Result Value Ref Range   WBC 4.4 4.0 - 10.5 K/uL   RBC 4.06 (L) 4.22 - 5.81 MIL/uL   Hemoglobin 11.8 (L) 13.0 - 17.0 g/dL   HCT 34.3 (L) 39.0 - 52.0 %   MCV 84.5 80.0 - 100.0 fL   MCH 29.1 26.0 - 34.0 pg   MCHC 34.4 30.0 - 36.0 g/dL   RDW 12.8 11.5 - 15.5 %   Platelets 176 150 - 400 K/uL   nRBC 0.0 0.0 - 0.2 %   Neutrophils Relative % 62 %   Neutro Abs 2.7 1.7 - 7.7 K/uL   Lymphocytes Relative 21 %   Lymphs Abs 0.9 0.7 - 4.0 K/uL   Monocytes Relative 13 %   Monocytes Absolute  0.6 0.1 - 1.0 K/uL   Eosinophils Relative 3 %   Eosinophils Absolute 0.1 0.0 - 0.5 K/uL   Basophils Relative 0 %   Basophils Absolute 0.0 0.0 - 0.1 K/uL   Immature Granulocytes 1 %   Abs Immature Granulocytes 0.02 0.00 - 0.07 K/uL  Comprehensive metabolic panel  Result Value Ref Range   Sodium 138 135 - 145 mmol/L   Potassium 4.1 3.5 - 5.1 mmol/L   Chloride 107 98 - 111 mmol/L   CO2 23 22 - 32 mmol/L   Glucose, Bld 96 70 - 99 mg/dL   BUN 37 (H) 8 - 23 mg/dL   Creatinine, Ser 2.00 (H) 0.61 - 1.24 mg/dL   Calcium 9.3 8.9 - 10.3 mg/dL   Total Protein 6.0 (L) 6.5 - 8.1 g/dL   Albumin 3.9 3.5 - 5.0 g/dL   AST 15 15 - 41 U/L   ALT 16 0 - 44 U/L   Alkaline Phosphatase 57 38 - 126 U/L   Total Bilirubin 0.6 0.3 - 1.2 mg/dL   GFR calc non Af Amer 31 (L) >60 mL/min   GFR calc Af Amer 36 (L) >60 mL/min   Anion gap 8 5 - 15  Urinalysis, Routine w reflex microscopic  Result Value Ref Range   Color, Urine STRAW (A) YELLOW   APPearance CLEAR CLEAR   Specific Gravity, Urine 1.009 1.005 - 1.030   pH 6.0 5.0 - 8.0   Glucose, UA NEGATIVE NEGATIVE mg/dL   Hgb urine  dipstick NEGATIVE NEGATIVE   Bilirubin Urine NEGATIVE NEGATIVE   Ketones, ur NEGATIVE NEGATIVE mg/dL   Protein, ur NEGATIVE NEGATIVE mg/dL   Nitrite NEGATIVE NEGATIVE   Leukocytes,Ua NEGATIVE NEGATIVE       Pertinent labs & imaging results that were available during my care of the patient were reviewed by me and considered in my medical decision making.  Assessment & Plan:  [name] was seen today for medical management of chronic issues and dizziness.  Diagnoses and all orders for this visit:  Vertigo Has not tried meclizine. Will try to see if beneficial. Slow position changes. Report any new or worsening symptoms. Follow up in 4 weeks or sooner if needed.   Essential hypertension Pt has stopped valsartan, losartan, and cardura. BP well controlled today. Has upcoming appointment with nephrology.  Continue Norvasc as prescribed.  -     amLODipine (NORVASC) 5 MG tablet; Take 1 tablet (5 mg total) by mouth daily. -     CMP14+EGFR  Abnormal kidney function Was seen in ED and given 1000 ml NS. Will recheck function today. Follow up with nephrology next week.  -     CMP14+EGFR  Anemia, unspecified type No abnormal bleeding or bruising. No melena or hematochezia. Will recheck CBC today along with anemia profile.  -     CBC with Differential/Platelet -     Anemia Profile B     Continue all other maintenance medications.  Follow up plan: Return in about 1 month (around 06/15/2019), or if symptoms worsen or fail to improve, for vertigo.  Continue healthy lifestyle choices, including diet (rich in fruits, vegetables, and lean proteins, and low in salt and simple carbohydrates) and exercise (at least 30 minutes of moderate physical activity daily).  Educational handout given for vertigo  The above assessment and management plan was discussed with the patient. The patient verbalized understanding of and has agreed to the management plan. Patient is aware to call the clinic if symptoms  persist or worsen. Patient is aware when to return to the clinic for a follow-up visit. Patient educated on when it is appropriate to go to the emergency department.   Monia Pouch, FNP-C [facility] [phone] 05/15/19

## 2019-05-15 NOTE — Patient Instructions (Signed)

## 2019-05-16 ENCOUNTER — Other Ambulatory Visit: Payer: Medicare Other

## 2019-05-16 ENCOUNTER — Other Ambulatory Visit: Payer: Self-pay

## 2019-05-16 DIAGNOSIS — E876 Hypokalemia: Secondary | ICD-10-CM | POA: Diagnosis not present

## 2019-05-16 LAB — ANEMIA PROFILE B
Basophils Absolute: 0 10*3/uL (ref 0.0–0.2)
Basos: 0 %
EOS (ABSOLUTE): 0.1 10*3/uL (ref 0.0–0.4)
Eos: 2 %
Ferritin: 274 ng/mL (ref 30–400)
Folate: 10.1 ng/mL (ref >3.0)
Hematocrit: 34.9 % — ABNORMAL LOW (ref 37.5–51.0)
Hemoglobin: 11.5 g/dL — ABNORMAL LOW (ref 13.0–17.7)
Immature Grans (Abs): 0 10*3/uL (ref 0.0–0.1)
Immature Granulocytes: 0 %
Iron Saturation: 26 % (ref 15–55)
Iron: 59 ug/dL (ref 38–169)
Lymphocytes Absolute: 1.1 10*3/uL (ref 0.7–3.1)
Lymphs: 22 %
MCH: 28.8 pg (ref 26.6–33.0)
MCHC: 33 g/dL (ref 31.5–35.7)
MCV: 87 fL (ref 79–97)
Monocytes Absolute: 0.6 10*3/uL (ref 0.1–0.9)
Monocytes: 12 %
Neutrophils Absolute: 3.2 10*3/uL (ref 1.4–7.0)
Neutrophils: 64 %
Platelets: 197 10*3/uL (ref 150–450)
RBC: 4 x10E6/uL — ABNORMAL LOW (ref 4.14–5.80)
RDW: 14 % (ref 11.6–15.4)
Retic Ct Pct: 1.5 % (ref 0.6–2.6)
Total Iron Binding Capacity: 224 ug/dL — ABNORMAL LOW (ref 250–450)
UIBC: 165 ug/dL (ref 111–343)
Vitamin B-12: 313 pg/mL (ref 232–1245)
WBC: 5.1 10*3/uL (ref 3.4–10.8)

## 2019-05-16 LAB — CMP14+EGFR
ALT: 14 IU/L (ref 0–44)
AST: 12 IU/L (ref 0–40)
Albumin/Globulin Ratio: 2.4 — ABNORMAL HIGH (ref 1.2–2.2)
Albumin: 4.1 g/dL (ref 3.7–4.7)
Alkaline Phosphatase: 73 IU/L (ref 39–117)
BUN/Creatinine Ratio: 15 (ref 10–24)
BUN: 23 mg/dL (ref 8–27)
Bilirubin Total: 0.3 mg/dL (ref 0.0–1.2)
CO2: 23 mmol/L (ref 20–29)
Calcium: 10.2 mg/dL (ref 8.6–10.2)
Chloride: 105 mmol/L (ref 96–106)
Creatinine, Ser: 1.55 mg/dL — ABNORMAL HIGH (ref 0.76–1.27)
GFR calc Af Amer: 49 mL/min/{1.73_m2} — ABNORMAL LOW (ref >59)
GFR calc non Af Amer: 43 mL/min/{1.73_m2} — ABNORMAL LOW (ref >59)
Globulin, Total: 1.7 g/dL (ref 1.5–4.5)
Glucose: 74 mg/dL (ref 65–99)
Potassium: 4.6 mmol/L (ref 3.5–5.2)
Sodium: 139 mmol/L (ref 134–144)
Total Protein: 5.8 g/dL — ABNORMAL LOW (ref 6.0–8.5)

## 2019-05-19 DIAGNOSIS — Z8546 Personal history of malignant neoplasm of prostate: Secondary | ICD-10-CM | POA: Diagnosis not present

## 2019-05-19 DIAGNOSIS — R9721 Rising PSA following treatment for malignant neoplasm of prostate: Secondary | ICD-10-CM | POA: Diagnosis not present

## 2019-05-19 DIAGNOSIS — N401 Enlarged prostate with lower urinary tract symptoms: Secondary | ICD-10-CM | POA: Diagnosis not present

## 2019-05-21 ENCOUNTER — Other Ambulatory Visit: Payer: Self-pay | Admitting: Urology

## 2019-05-21 DIAGNOSIS — C61 Malignant neoplasm of prostate: Secondary | ICD-10-CM

## 2019-05-24 ENCOUNTER — Other Ambulatory Visit: Payer: Self-pay | Admitting: Family Medicine

## 2019-05-24 DIAGNOSIS — R42 Dizziness and giddiness: Secondary | ICD-10-CM

## 2019-06-02 ENCOUNTER — Encounter: Payer: Self-pay | Admitting: Adult Health

## 2019-06-02 ENCOUNTER — Encounter (HOSPITAL_COMMUNITY): Payer: Medicare Other

## 2019-06-02 ENCOUNTER — Ambulatory Visit: Payer: Medicare Other | Admitting: Adult Health

## 2019-06-02 ENCOUNTER — Other Ambulatory Visit: Payer: Self-pay

## 2019-06-02 ENCOUNTER — Other Ambulatory Visit (HOSPITAL_COMMUNITY): Payer: Medicare Other

## 2019-06-02 VITALS — BP 133/83 | HR 73 | Temp 97.1°F | Ht 67.0 in | Wt 205.8 lb

## 2019-06-02 DIAGNOSIS — R413 Other amnesia: Secondary | ICD-10-CM

## 2019-06-02 MED ORDER — MEMANTINE HCL 5 MG PO TABS: ORAL_TABLET | ORAL | 3 refills | Status: DC

## 2019-06-02 NOTE — Progress Notes (Signed)
PATIENT: Ricky [name] DOB: 24-May-1941  REASON FOR VISIT: follow up HISTORY FROM: patient  HISTORY OF PRESENT ILLNESS: Today 06/02/19:  Ricky [name] is a 78 year old male with a history of memory disturbance.  He returns today for follow-up.  He is with his son today.  He reports that he is able to complete all ADLs independently.  His son manages his finances.  He is currently in the process of getting a divorce from his wife.  He plans to live alone after she moves out.  He does operate a motor vehicle but only drives to familiar places in [place].  He does not do much cooking but does plan to use the microwave once his wife moves out.  His son also brings them meals.  He denies any trouble sleeping.  Denies any changes with his mood or behavior.  He returns today for an evaluation.  HISTORY 10/14/18:  Ricky [name] is a 78 year old male with a history of memory disturbance.  He returns today for follow-up.  He states that he has noticed some changes with his memory.  He continues to live at home with his wife.  He is able to complete all ADLs independently.  He states that he does operate a motor vehicle but he has gotten lost while driving.  He states most the time he is able to correct this.  He continues to manage his own finances but he does note that he has some trouble.  He is concerned about money issues once he has passed.  Denies any issues with appetite.  He reports difficulty managing his medications.  Despite living with his wife he reports that she does not want help with his medication.  He is not sure of his medications.  He manages his own appointments.  The patient today had a lot of questions about his finances.  He spent most of the visit asking me financial questions.  I did advise on multiple occasions that this was not my specialty as my advice would only be my personal opinions.  He returns today for follow-up.  REVIEW OF SYSTEMS: Out of a complete 14 system review of symptoms,  the patient complains only of the following symptoms, and all other reviewed systems are negative.  See HPI  ALLERGIES: No Known Allergies  HOME MEDICATIONS: Outpatient Medications Prior to Visit  Medication Sig Dispense Refill  . amLODipine (NORVASC) 5 MG tablet Take 1 tablet (5 mg total) by mouth daily. 90 tablet 3  . Cholecalciferol (VITAMIN D PO) Take 1 capsule by mouth daily.     Marland Kitchen FIBER FORMULA PO Take 3 capsules by mouth 2 (two) times daily.    . meclizine (ANTIVERT) 12.5 MG tablet TAKE 1 TABLET (12.5 MG TOTAL) BY MOUTH 3 (THREE) TIMES DAILY AS NEEDED FOR DIZZINESS. 30 tablet 0  . memantine (NAMENDA) 5 MG tablet Take 1 tablet (5 mg total) by mouth 2 (two) times daily. 180 tablet 3  . spironolactone (ALDACTONE) 50 MG tablet Take 1 tablet (50 mg total) by mouth daily. 90 tablet 0  . tolterodine (DETROL LA) 4 MG 24 hr capsule Take 4 mg by mouth at bedtime.   10   No facility-administered medications prior to visit.     PAST MEDICAL HISTORY: Past Medical History:  Diagnosis Date  . Chronic cough   . Deviated nasal septum   . 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 Family history of prostate cancer   . GERD (  gastroesophageal reflux disease)   . History of adenomatous polyp of colon    2008; 2011  . History of exercise stress test    01-14-2009 by dr hochrein-- normal w/ no ischemia  . Hot flashes    DUE TO HORMONE THERAPY FOR PROSTATE CANCER  . Hyperlipidemia   . Hypertension   . Mild memory disturbance    per dr Jannifer Franklin note (neurologist)  . Nocturia more than twice per night   . Nocturnal leg cramps 03/23/2017  . OA (osteoarthritis)   . OSA (obstructive sleep apnea)    moderate osa per study 02-12-2017  cpap recommended--- per pt never heard back from doctor for obtaining cpap  . Pneumonia 02/2018  . 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 CATARACT EXTRACTION W/ INTRAOCULAR LENS  IMPLANT, BILATERAL  2016  . COLONOSCOPY  last one 08-08-2013  . CYSTOSCOPY N/A 08/10/2017   Procedure: CYSTOSCOPY FLEXIBLE;  Surgeon: Kathie Rhodes, MD;  Location: [facility];  Service: Urology;  Laterality: N/A;  NO SEEDS FOUND IN BLADDER  . HAND SURGERY Left 1992  . KNEE ARTHROSCOPY Bilateral 1960's and 70's  . PROSTATE BIOPSY  10-10-2016   dr Karsten Ro office  . RADIOACTIVE SEED IMPLANT N/A 08/10/2017   Procedure: RADIOACTIVE SEED IMPLANT/BRACHYTHERAPY IMPLANT;  Surgeon: Kathie Rhodes, MD;  Location: [facility];  Service: Urology;  Laterality: N/A;    64   SEEDS IMPLANTED  . ROTATOR CUFF REPAIR Right 2004  . SPACE OAR INSTILLATION N/A 08/10/2017   Procedure: SPACE OAR INSTILLATION;  Surgeon: Kathie Rhodes, MD;  Location: [facility];  Service: Urology;  Laterality: N/A;  . TOE SURGERY Left 1970   little toe  . TOTAL KNEE ARTHROPLASTY Bilateral 01-25-2009   dr Wynelle Link [facility]    FAMILY HISTORY: Family History  Problem Relation Age of Onset  . Healthy Mother   . Prostate cancer Father        Dx 38s; Deceased 67  . Prostate cancer Brother 23       currently 40  . Alcohol abuse Brother   . Bipolar disorder Brother   . Prostate cancer Paternal Uncle        unsure of age; possibly 2nd uncle also has prostate ca  . Prostate cancer Maternal Uncle        unsure of age  . Breast cancer Maternal Aunt        unsure of age  . Colon cancer Neg Hx   . Esophageal cancer Neg Hx   . Rectal cancer Neg Hx   . Stomach cancer Neg Hx     SOCIAL HISTORY: Social History   Socioeconomic History  . Marital status: Married    Spouse name: [name]  . Number of children: 3  . Years of education: BA  . Highest education  level: Not on file  Occupational History  . Occupation: Retired  Scientific laboratory technician  . Financial resource strain: Not on file  . Food insecurity    Worry: Not on file    Inability: Not on file  . Transportation needs    Medical: Not on file    Non-medical: Not on file  Tobacco Use  . Smoking status: Never Smoker  . Smokeless tobacco: Never Used  Substance and Sexual Activity  . Alcohol use: Yes    Comment: rare  . Drug use: No  . Sexual activity: Yes    Partners: Female    Comment: vasectomy  Lifestyle  . Physical activity    Days per week: Not on file    Minutes per session: Not on file  . Stress: Not on file  Relationships  . Social Herbalist on phone: Not on file    Gets together: Not on file    Attends religious service: Not on file    Active member of club or organization: Not on file    Attends meetings of clubs or organizations: Not on file    Relationship status: Not on file  . Intimate partner violence    Fear of current or ex partner: Not on file    Emotionally abused: Not on file    Physically abused: Not on file    Forced sexual activity: Not on file  Other Topics Concern  . Not on file  Social History Narrative   Lives at home w/ his wife, patient has left his wife and is now staying with his son [name] as of 03/19/2019   Right-handed   Caffeine: none      PHYSICAL EXAM  Vitals:   06/02/19 0816  BP: 133/83  Pulse: 73  Temp: (!) 97.1 F (36.2 C)  TempSrc: Oral  Weight: 205 lb 12.8 oz (93.4 kg)  Height: 5\' 7"  (1.702 m)   Body mass index is 32.23 kg/m.   MMSE - Mini Mental State Exam 06/02/2019 10/14/2018 03/25/2018  Not completed: - (No Data) -  Orientation to time 2 5 4   Orientation to Place 3 5 5   Registration 3 3 3   Attention/ Calculation 0 1 4  Recall 3 3 3   Language- name 2 objects 2 2 2   Language- repeat 1 1 1   Language- follow 3 step command 1 3 2   Language- follow 3 step command-comments He only took the paper in his right  hand - -  Language- read & follow direction 1 1 1   Write a sentence 1 1 0  Copy design 0 1 1  Total score 17 26 26      Generalized: Well developed, in no acute distress   Neurological examination  Mentation: Alert oriented to time, place, history taking. Follows all commands speech and language fluent Cranial nerve II-XII: Pupils were equal round reactive to light. Extraocular movements were full, visual field were full on confrontational test. Facial sensation and strength were normal. Head turning and shoulder shrug  were normal and symmetric. Motor: The motor testing reveals 5 over 5 strength of all 4 extremities. Good symmetric motor tone is noted throughout.  Sensory: Sensory testing is intact to soft touch on all 4 extremities. No evidence of extinction is noted.  Coordination: Cerebellar testing reveals good finger-nose-finger and heel-to-shin bilaterally.  Gait and station: Gait is normal.  Reflexes: Deep tendon reflexes are symmetric and normal bilaterally.   DIAGNOSTIC DATA (LABS, IMAGING, TESTING) - I reviewed patient records, labs, notes, testing and imaging myself where available.  Lab Results  Component Value Date   WBC 5.1 05/15/2019   HGB 11.5 (L) 05/15/2019   HCT 34.9 (L) 05/15/2019   MCV 87 05/15/2019   PLT 197 05/15/2019      Component Value Date/Time   NA 139 05/15/2019 0841  K 4.6 05/15/2019 0841   CL 105 05/15/2019 0841   CO2 23 05/15/2019 0841   GLUCOSE 74 05/15/2019 0841   GLUCOSE 96 05/02/2019 1010   BUN 23 05/15/2019 0841   CREATININE 1.55 (H) 05/15/2019 0841   CALCIUM 10.2 05/15/2019 0841   PROT 5.8 (L) 05/15/2019 0841   ALBUMIN 4.1 05/15/2019 0841   AST 12 05/15/2019 0841   ALT 14 05/15/2019 0841   ALKPHOS 73 05/15/2019 0841   BILITOT 0.3 05/15/2019 0841   GFRNONAA 43 (L) 05/15/2019 0841   GFRAA 49 (L) 05/15/2019 0841   Lab Results  Component Value Date   CHOL 174 11/13/2018   HDL 45 11/13/2018   LDLCALC 106 (H) 11/13/2018   TRIG 115  11/13/2018   CHOLHDL 3.9 11/13/2018   No results found for: HGBA1C Lab Results  Component Value Date   VITAMINB12 313 05/15/2019   Lab Results  Component Value Date   TSH 1.630 05/01/2019      ASSESSMENT AND PLAN 78 y.o. 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 here with:  1.  Memory disturbance  The patient's memory score has declined since the last visit.  I will slowly try to increase his Namenda again.  He will take Namenda 5 mg in the morning and increase his nighttime dose to 10 mg.  I have advised the patient and his son that if he develops dizziness that does not subside they should let me know. Now that the patient will be living alone I have encouraged the son to check on him frequently.  I have advised that if his symptoms worsen or he develops new symptoms they should let us know.  He will follow-up in 6 months or sooner if needed.   I spent 25 minutes with the patient. 50% of this time was spent discussing memory score   Ward Givens, MSN, NP-C 06/02/2019, 8:16 AM [facility] [address] [place],  [address] [phone]

## 2019-06-02 NOTE — Patient Instructions (Signed)
Your Plan:  Increase Namenda 5 mg in the AM and 10 mg in the PM If your symptoms worsen or you develop new symptoms please let us know.   Thank you for coming to see [place] at [facility]. I hope we have been able to provide you high quality care today.  You may receive a patient satisfaction survey over the next few weeks. We would appreciate your feedback and comments so that we may continue to improve ourselves and the health of our patients.

## 2019-06-03 ENCOUNTER — Ambulatory Visit (HOSPITAL_COMMUNITY)
Admission: RE | Admit: 2019-06-03 | Discharge: 2019-06-03 | Disposition: A | Discharge status: Home / Self Care | Payer: Medicare Other | Source: Ambulatory Visit | Attending: Urology | Admitting: Urology

## 2019-06-03 ENCOUNTER — Other Ambulatory Visit: Payer: Self-pay

## 2019-06-03 ENCOUNTER — Encounter (HOSPITAL_COMMUNITY)
Admission: RE | Admit: 2019-06-03 | Discharge: 2019-06-03 | Disposition: A | Discharge status: Home / Self Care | Payer: Medicare Other | Source: Ambulatory Visit | Attending: Urology | Admitting: Urology

## 2019-06-03 DIAGNOSIS — K8020 Calculus of gallbladder without cholecystitis without obstruction: Secondary | ICD-10-CM | POA: Diagnosis not present

## 2019-06-03 DIAGNOSIS — C61 Malignant neoplasm of prostate: Secondary | ICD-10-CM

## 2019-06-03 DIAGNOSIS — Z8546 Personal history of malignant neoplasm of prostate: Secondary | ICD-10-CM | POA: Diagnosis not present

## 2019-06-03 MED ORDER — TECHNETIUM TC 99M MEDRONATE IV KIT
20.0000 | PACK | Freq: Once | INTRAVENOUS | Status: AC | PRN
  Administered 2019-06-03: 20.2 via INTRAVENOUS

## 2019-06-09 DIAGNOSIS — N179 Acute kidney failure, unspecified: Secondary | ICD-10-CM | POA: Diagnosis not present

## 2019-06-09 DIAGNOSIS — E876 Hypokalemia: Secondary | ICD-10-CM | POA: Diagnosis not present

## 2019-06-09 DIAGNOSIS — I10 Essential (primary) hypertension: Secondary | ICD-10-CM | POA: Diagnosis not present

## 2019-06-09 DIAGNOSIS — E269 Hyperaldosteronism, unspecified: Secondary | ICD-10-CM | POA: Diagnosis not present

## 2019-06-13 ENCOUNTER — Ambulatory Visit: Payer: Medicare Other | Admitting: Family Medicine

## 2019-07-03 ENCOUNTER — Ambulatory Visit: Payer: Medicare Other | Admitting: Family Medicine

## 2019-07-08 ENCOUNTER — Other Ambulatory Visit: Payer: Self-pay

## 2019-07-08 ENCOUNTER — Encounter: Payer: Self-pay | Admitting: Family Medicine

## 2019-07-08 ENCOUNTER — Ambulatory Visit (INDEPENDENT_AMBULATORY_CARE_PROVIDER_SITE_OTHER): Payer: Medicare Other | Admitting: Family Medicine

## 2019-07-08 VITALS — BP 169/95 | HR 67 | Temp 97.8°F | Resp 20 | Ht 67.0 in | Wt 202.0 lb

## 2019-07-08 DIAGNOSIS — E782 Mixed hyperlipidemia: Secondary | ICD-10-CM | POA: Diagnosis not present

## 2019-07-08 DIAGNOSIS — Z23 Encounter for immunization: Secondary | ICD-10-CM | POA: Diagnosis not present

## 2019-07-08 DIAGNOSIS — I10 Essential (primary) hypertension: Secondary | ICD-10-CM | POA: Diagnosis not present

## 2019-07-08 DIAGNOSIS — E559 Vitamin D deficiency, unspecified: Secondary | ICD-10-CM

## 2019-07-08 DIAGNOSIS — R413 Other amnesia: Secondary | ICD-10-CM

## 2019-07-08 MED ORDER — SPIRONOLACTONE 50 MG PO TABS: 50.0000 mg | ORAL_TABLET | Freq: Every day | ORAL | 0 refills | Status: DC

## 2019-07-08 NOTE — Progress Notes (Signed)
Subjective:  Patient ID: Ricky [name], male    DOB: [birth year], 78 y.o.   MRN: [id number]  Patient Care Team: Baruch Gouty, FNP as PCP - General (Family Medicine) Celene Squibb, MD as Consulting Physician (Dermatology) Tanda Rockers, MD as Consulting Physician (Pulmonary Disease) Kathie Rhodes, MD as Consulting Physician (Urology) Rutherford Guys, MD as Consulting Physician (Ophthalmology)   Chief Complaint:  Medical Management of Chronic Issues (1 mo - med check / vertigo)   HPI: Ricky [name] is a 78 y.o. male presenting on 07/08/2019 for Medical Management of Chronic Issues (1 mo - med check / vertigo)   1. Essential hypertension  Blood pressure has been previously controlled. With medications. Pt has recently been removed from amlodipine due to declining renal function by nephrology. Pt continues to take spironolactone 25 mg daily. Pt denies chest pain, leg swelling, palpitations, headaches, or syncope. Does continue to have intermittent dizziness with sudden position changes. States this resolves quickly once he sits or remains still. No focal deficits with dizzy spells. He has followed up with neurology for the dizziness, no new medications or studies noted since previous visit.     2. Vitamin D deficiency  Pt is taking oral repletion therapy. Denies bone pain and tenderness, muscle weakness, fracture, and difficulty walking. Lab Results  Component Value Date   VD25OH 39.6 11/13/2018   VD25OH 37.1 07/10/2018   VD25OH 42.1 07/12/2017   Lab Results  Component Value Date   CALCIUM 10.2 05/15/2019      3. Mixed hyperlipidemia  Diet controlled. Tries to monitor what he eats. Does take fiber on a regular basis. Does not exercise on a regular basis.   Lab Results  Component Value Date   CHOL 174 11/13/2018   HDL 45 11/13/2018   LDLCALC 106 (H) 11/13/2018   TRIG 115 11/13/2018   CHOLHDL 3.9 11/13/2018     Family and personal medical history reviewed. Smoking and ETOH  history reviewed.    4. Memory difficulty  Followed by neurology who recently increased Namenda to 10 mg nightly and maintained Namenda 5 mg dose in the morning. Pt and son do not report a noted change since th change in medications. MMSE score 21 today. Pt continues to live in an apartment in his son's basement. Pt is planning on returning home when able. Pt can complete ADL's on own. Son handles his finances.     Relevant past medical, surgical, family, and social history reviewed and updated as indicated.  Allergies and medications reviewed and updated. Date reviewed: Chart in Epic.   Past Medical History:  Diagnosis Date  . Chronic cough   . Deviated nasal septum   . 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 Family history of prostate cancer   . GERD (gastroesophageal reflux disease)   . History of adenomatous polyp of colon    2008; 2011  . History of exercise stress test    01-14-2009 by dr hochrein-- normal w/ no ischemia  . Hot flashes    DUE TO HORMONE THERAPY FOR PROSTATE CANCER  . Hyperlipidemia   . Hypertension   . Mild memory disturbance    per dr Jannifer Franklin note (neurologist)  . Nocturia more than twice per night   . Nocturnal leg cramps 03/23/2017  . OA (osteoarthritis)   . OSA (obstructive sleep apnea)    moderate osa per study 02-12-2017  cpap recommended--- per pt never heard back from doctor for obtaining  cpap  . Pneumonia 02/2018  . Prostate cancer [facility]) urologist-  dr ottelin/  oncologist-  dr Tammi Klippel   dx 10-10-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, Gleason 3+4, PSA 9.02, vol 78cc started ADT to decrease size;  08/ 2018 prostate decrease size vol 46cc and PSA 0.36--- scheduled for radiative prostate seed implants    Past Surgical History:  Procedure Laterality Date  . CATARACT EXTRACTION W/ INTRAOCULAR LENS  IMPLANT, BILATERAL  2016  . COLONOSCOPY  last one 08-08-2013  . CYSTOSCOPY N/A 08/10/2017   Procedure: CYSTOSCOPY FLEXIBLE;  Surgeon: Kathie Rhodes, MD;   Location: [facility];  Service: Urology;  Laterality: N/A;  NO SEEDS FOUND IN BLADDER  . HAND SURGERY Left 1992  . KNEE ARTHROSCOPY Bilateral 1960's and 70's  . PROSTATE BIOPSY  10-10-2016   dr Karsten Ro office  . RADIOACTIVE SEED IMPLANT N/A 08/10/2017   Procedure: RADIOACTIVE SEED IMPLANT/BRACHYTHERAPY IMPLANT;  Surgeon: Kathie Rhodes, MD;  Location: [facility];  Service: Urology;  Laterality: N/A;    64   SEEDS IMPLANTED  . ROTATOR CUFF REPAIR Right 2004  . SPACE OAR INSTILLATION N/A 08/10/2017   Procedure: SPACE OAR INSTILLATION;  Surgeon: Kathie Rhodes, MD;  Location: [facility];  Service: Urology;  Laterality: N/A;  . TOE SURGERY Left 1970   little toe  . TOTAL KNEE ARTHROPLASTY Bilateral 01-25-2009   dr Wynelle Link [facility]    Social History   Socioeconomic History  . Marital status: Married    Spouse name: [name]  . Number of children: 3  . Years of education: BA  . Highest education level: Not on file  Occupational History  . Occupation: Retired  Scientific laboratory technician  . Financial resource strain: Not on file  . Food insecurity    Worry: Not on file    Inability: Not on file  . Transportation needs    Medical: Not on file    Non-medical: Not on file  Tobacco Use  . Smoking status: Never Smoker  . Smokeless tobacco: Never Used  Substance and Sexual Activity  . Alcohol use: Yes    Comment: rare  . Drug use: No  . Sexual activity: Yes    Partners: Female    Comment: vasectomy  Lifestyle  . Physical activity    Days per week: Not on file    Minutes per session: Not on file  . Stress: Not on file  Relationships  . Social Herbalist on phone: Not on file    Gets together: Not on file    Attends religious service: Not on file    Active member of club or organization: Not on file    Attends meetings of clubs or organizations: Not on file    Relationship status: Not on file  . Intimate partner violence    Fear of current or ex  partner: Not on file    Emotionally abused: Not on file    Physically abused: Not on file    Forced sexual activity: Not on file  Other Topics Concern  . Not on file  Social History Narrative   Lives at home w/ his wife, patient has left his wife and is now staying with his son [name] as of 03/19/2019   Right-handed   Caffeine: none    Outpatient Encounter Medications as of 07/08/2019  Medication Sig  . Cholecalciferol (VITAMIN D PO) Take 1 capsule by mouth daily.   Marland Kitchen FIBER FORMULA PO Take 3 capsules by mouth  2 (two) times daily.  . memantine (NAMENDA) 5 MG tablet Take 1 tablet in the AM and 2 tablets in the PM  . spironolactone (ALDACTONE) 50 MG tablet Take 1 tablet (50 mg total) by mouth daily.  Marland Kitchen tolterodine (DETROL LA) 4 MG 24 hr capsule Take 4 mg by mouth at bedtime.   . [DISCONTINUED] spironolactone (ALDACTONE) 50 MG tablet Take 1 tablet (50 mg total) by mouth daily.  Marland Kitchen levofloxacin (LEVAQUIN) 750 MG tablet TAKE 1 TABLET BY MOUTH THE MORNING OF YOUR PROSTATE BIOPSY.  . [DISCONTINUED] amLODipine (NORVASC) 5 MG tablet Take 1 tablet (5 mg total) by mouth daily.  . [DISCONTINUED] meclizine (ANTIVERT) 12.5 MG tablet TAKE 1 TABLET (12.5 MG TOTAL) BY MOUTH 3 (THREE) TIMES DAILY AS NEEDED FOR DIZZINESS. (Patient not taking: Reported on 07/08/2019)   No facility-administered encounter medications on file as of 07/08/2019.     No Known Allergies  Review of Systems  Constitutional: Negative for activity change, appetite change, chills, diaphoresis, fatigue, fever and unexpected weight change.  HENT: Negative.   Eyes: Negative.  Negative for photophobia and visual disturbance.  Respiratory: Negative for cough, chest tightness and shortness of breath.   Cardiovascular: Negative for chest pain, palpitations and leg swelling.  Gastrointestinal: Negative for abdominal distention, abdominal pain, anal bleeding, blood in stool, constipation, diarrhea, nausea, rectal pain and vomiting.  Endocrine:  Negative.  Negative for cold intolerance, heat intolerance, polydipsia, polyphagia and polyuria.  Genitourinary: Negative for decreased urine volume, difficulty urinating, dysuria, flank pain, frequency, hematuria, penile pain, penile swelling, scrotal swelling, testicular pain and urgency.  Musculoskeletal: Negative for arthralgias and myalgias.  Skin: Negative.  Negative for color change and pallor.  Allergic/Immunologic: Negative.   Neurological: Positive for dizziness. Negative for tremors, seizures, syncope, facial asymmetry, speech difficulty, weakness, light-headedness, numbness and headaches.  Hematological: Negative.   Psychiatric/Behavioral: Positive for confusion (baseline). Negative for agitation, hallucinations, sleep disturbance and suicidal ideas.  All other systems reviewed and are negative.       Objective:  BP (!) 169/95   Pulse 67   Temp 97.8 F (36.6 C)   Resp 20   Ht 5' 7" (1.702 m)   Wt 202 lb (91.6 kg)   SpO2 97%   BMI 31.64 kg/m    Wt Readings from Last 3 Encounters:  07/08/19 202 lb (91.6 kg)  06/02/19 205 lb 12.8 oz (93.4 kg)  05/15/19 200 lb (90.7 kg)    Physical Exam Vitals signs and nursing note reviewed.  Constitutional:      General: He is not in acute distress.    Appearance: Normal appearance. He is well-developed and well-groomed. He is obese. He is not ill-appearing, toxic-appearing or diaphoretic.  HENT:     Head: Normocephalic and atraumatic.     Jaw: There is normal jaw occlusion.     Right Ear: Hearing, tympanic membrane, ear canal and external ear normal. There is no impacted cerumen.     Left Ear: Hearing, tympanic membrane, ear canal and external ear normal. There is no impacted cerumen.     Nose: Nose normal.     Mouth/Throat:     Lips: Pink.     Mouth: Mucous membranes are moist.     Pharynx: Oropharynx is clear. Uvula midline.  Eyes:     General: Lids are normal.     Extraocular Movements: Extraocular movements intact.      Conjunctiva/sclera: Conjunctivae normal.     Pupils: Pupils are equal, round, and reactive to light.  Neck:     Musculoskeletal: Normal range of motion and neck supple.     Thyroid: No thyroid mass, thyromegaly or thyroid tenderness.     Vascular: No carotid bruit or JVD.     Trachea: Trachea and phonation normal.  Cardiovascular:     Rate and Rhythm: Normal rate and regular rhythm.     Chest Wall: PMI is not displaced.     Pulses: Normal pulses.     Heart sounds: Normal heart sounds. No murmur. No friction rub. No gallop.   Pulmonary:     Effort: Pulmonary effort is normal. No respiratory distress.     Breath sounds: Normal breath sounds. No wheezing.  Abdominal:     General: Bowel sounds are normal. There is no distension or abdominal bruit.     Palpations: Abdomen is soft. There is no hepatomegaly or splenomegaly.     Tenderness: There is no abdominal tenderness. There is no right CVA tenderness or left CVA tenderness.     Hernia: No hernia is present.  Musculoskeletal: Normal range of motion.     Right lower leg: No edema.     Left lower leg: No edema.  Lymphadenopathy:     Cervical: No cervical adenopathy.  Skin:    General: Skin is warm and dry.     Capillary Refill: Capillary refill takes less than 2 seconds.     Coloration: Skin is not cyanotic, jaundiced or pale.     Findings: No rash.  Neurological:     General: No focal deficit present.     Mental Status: He is alert and oriented to person, place, and time. Mental status is at baseline.     Cranial Nerves: Cranial nerves are intact. No cranial nerve deficit.     Sensory: Sensation is intact. No sensory deficit.     Motor: Motor function is intact. No weakness.     Coordination: Coordination is intact. Coordination normal.     Gait: Gait is intact. Gait normal.     Deep Tendon Reflexes: Reflexes are normal and symmetric. Reflexes normal.  Psychiatric:        Attention and Perception: Attention and perception normal.         Mood and Affect: Mood and affect normal.        Speech: Speech normal.        Behavior: Behavior normal. Behavior is cooperative.        Thought Content: Thought content normal.        Cognition and Memory: Cognition normal. Memory is impaired.        Judgment: Judgment normal.     Results for orders placed or performed in visit on 05/15/19  CMP14+EGFR  Result Value Ref Range   Glucose 74 65 - 99 mg/dL   BUN 23 8 - 27 mg/dL   Creatinine, Ser 1.55 (H) 0.76 - 1.27 mg/dL   GFR calc non Af Amer 43 (L) >59 mL/min/1.73   GFR calc Af Amer 49 (L) >59 mL/min/1.73   BUN/Creatinine Ratio 15 10 - 24   Sodium 139 134 - 144 mmol/L   Potassium 4.6 3.5 - 5.2 mmol/L   Chloride 105 96 - 106 mmol/L   CO2 23 20 - 29 mmol/L   Calcium 10.2 8.6 - 10.2 mg/dL   Total Protein 5.8 (L) 6.0 - 8.5 g/dL   Albumin 4.1 3.7 - 4.7 g/dL   Globulin, Total 1.7 1.5 - 4.5 g/dL   Albumin/Globulin Ratio 2.4 (H) 1.2 - 2.2  Bilirubin Total 0.3 0.0 - 1.2 mg/dL   Alkaline Phosphatase 73 39 - 117 IU/L   AST 12 0 - 40 IU/L   ALT 14 0 - 44 IU/L  Anemia Profile B  Result Value Ref Range   Total Iron Binding Capacity 224 (L) 250 - 450 ug/dL   UIBC 165 111 - 343 ug/dL   Iron 59 38 - 169 ug/dL   Iron Saturation 26 15 - 55 %   Ferritin 274 30 - 400 ng/mL   Vitamin B-12 313 232 - 1,245 pg/mL   Folate 10.1 >3.0 ng/mL   WBC 5.1 3.4 - 10.8 x10E3/uL   RBC 4.00 (L) 4.14 - 5.80 x10E6/uL   Hemoglobin 11.5 (L) 13.0 - 17.7 g/dL   Hematocrit 34.9 (L) 37.5 - 51.0 %   MCV 87 79 - 97 fL   MCH 28.8 26.6 - 33.0 pg   MCHC 33.0 31.5 - 35.7 g/dL   RDW 14.0 11.6 - 15.4 %   Platelets 197 150 - 450 x10E3/uL   Neutrophils 64 Not Estab. %   Lymphs 22 Not Estab. %   Monocytes 12 Not Estab. %   Eos 2 Not Estab. %   Basos 0 Not Estab. %   Neutrophils Absolute 3.2 1.4 - 7.0 x10E3/uL   Lymphocytes Absolute 1.1 0.7 - 3.1 x10E3/uL   Monocytes Absolute 0.6 0.1 - 0.9 x10E3/uL   EOS (ABSOLUTE) 0.1 0.0 - 0.4 x10E3/uL   Basophils Absolute  0.0 0.0 - 0.2 x10E3/uL   Immature Granulocytes 0 Not Estab. %   Immature Grans (Abs) 0.0 0.0 - 0.1 x10E3/uL   Retic Ct Pct 1.5 0.6 - 2.6 %       Pertinent labs & imaging results that were available during my care of the patient were reviewed by me and considered in my medical decision making.  Assessment & Plan:  Ricky [name] was seen today for medical management of chronic issues.  Diagnoses and all orders for this visit:  Essential hypertension BP poorly controlled. Changes were not made in regimen today, will await labs and collaborate with Dr. Joelyn Oms to determine appropriate BP management. Daily blood pressure log given with instructions on how to fill out and told to bring to next visit. Goal BP 130/80. Pt aware to report any persistent high or low readings. DASH diet and exercise encouraged. Exercise at least 150 minutes per week and increase as tolerated. Goal BMI > 25. Stress management encouraged. Smoking cessation discussed. Avoid excessive alcohol. Avoid NSAID's. Avoid more than 2000 mg of sodium daily. Medications as prescribed. Follow up as scheduled.  -     spironolactone (ALDACTONE) 50 MG tablet; Take 1 tablet (50 mg total) by mouth daily. -     CMP14+EGFR -     CBC with Differential/Platelet -     Thyroid Panel With TSH  Vitamin D deficiency Labs pending. Continue repletion therapy. If indicated, will change repletion dosage. Eat foods rich in Vit D including milk, orange juice, yogurt with vitamin D added, salmon or mackerel, canned tuna fish, cereals with vitamin D added, and cod liver oil. Get out in the sun but make sure to wear at least SPF 30 sunscreen.  -     VITAMIN D 25 Hydroxy (Vit-D Deficiency, Fractures)  Mixed hyperlipidemia Diet encouraged - increase intake of fresh fruits and vegetables, increase intake of lean proteins. Bake, broil, or grill foods. Avoid fried, greasy, and fatty foods. Avoid fast foods. Increase intake of fiber-rich whole grains. Exercise  encouraged -  at least 150 minutes per week and advance as tolerated.  Goal BMI < 25. Follow up in 3-6 months as discussed.  -     Lipid panel  Memory difficulty Followed by neurology. Continue medications as prescribed. Will check thyroid function today.  -     Thyroid Panel With TSH  Need for immunization against influenza -     Flu Vaccine QUAD High Dose(Fluad)     Continue all other maintenance medications.  Follow up plan: Return in about 4 weeks (around 08/05/2019), or if symptoms worsen or fail to improve, for HTN.  Continue healthy lifestyle choices, including diet (rich in fruits, vegetables, and lean proteins, and low in salt and simple carbohydrates) and exercise (at least 30 minutes of moderate physical activity daily).  Educational handout given for DASH diet  The above assessment and management plan was discussed with the patient. The patient verbalized understanding of and has agreed to the management plan. Patient is aware to call the clinic if they develop any new symptoms or if symptoms persist or worsen. Patient is aware when to return to the clinic for a follow-up visit. Patient educated on when it is appropriate to go to the emergency department.   Total time spent with patient 45 minutes.  Greater than 50% of encounter spent in coordination of care/counseling.  Monia Pouch, FNP-C [facility] [phone]

## 2019-07-08 NOTE — Patient Instructions (Signed)
DASH Eating Plan DASH stands for "Dietary Approaches to Stop Hypertension." The DASH eating plan is a healthy eating plan that has been shown to reduce high blood pressure (hypertension). Additional health benefits may include reducing the risk of type 2 diabetes mellitus, heart disease, and stroke. The DASH eating plan may also help with weight loss.  WHAT DO I NEED TO KNOW ABOUT THE DASH EATING PLAN? For the DASH eating plan, you will follow these general guidelines:  Choose foods with a percent daily value for sodium of less than 5% (as listed on the food label).  Use salt-free seasonings or herbs instead of table salt or sea salt.  Check with your health care provider or pharmacist before using salt substitutes.  Eat lower-sodium products, often labeled as "lower sodium" or "no salt added."  Eat fresh foods.  Eat more vegetables, fruits, and low-fat dairy products.  Choose whole grains. Look for the word "whole" as the first word in the ingredient list.  Choose fish and skinless chicken or turkey more often than red meat. Limit fish, poultry, and meat to 6 oz (170 g) each day.  Limit sweets, desserts, sugars, and sugary drinks.  Choose heart-healthy fats.  Limit cheese to 1 oz (28 g) per day.  Eat more home-cooked food and less restaurant, buffet, and fast food.  Limit fried foods.  Cook foods using methods other than frying.  Limit canned vegetables. If you do use them, rinse them well to decrease the sodium.  When eating at a restaurant, ask that your food be prepared with less salt, or no salt if possible.  WHAT FOODS CAN I EAT? Seek help from a dietitian for individual calorie needs.  Grains Whole grain or whole wheat bread. Brown rice. Whole grain or whole wheat pasta. Quinoa, bulgur, and whole grain cereals. Low-sodium cereals. Corn or whole wheat flour tortillas. Whole grain cornbread. Whole grain crackers. Low-sodium crackers.  Vegetables Fresh or frozen  vegetables (raw, steamed, roasted, or grilled). Low-sodium or reduced-sodium tomato and vegetable juices. Low-sodium or reduced-sodium tomato sauce and paste. Low-sodium or reduced-sodium canned vegetables.   Fruits All fresh, canned (in natural juice), or frozen fruits.  Meat and Other Protein Products Ground beef (85% or leaner), grass-fed beef, or beef trimmed of fat. Skinless chicken or turkey. Ground chicken or turkey. Pork trimmed of fat. All fish and seafood. Eggs. Dried beans, peas, or lentils. Unsalted nuts and seeds. Unsalted canned beans.  Dairy Low-fat dairy products, such as skim or 1% milk, 2% or reduced-fat cheeses, low-fat ricotta or cottage cheese, or plain low-fat yogurt. Low-sodium or reduced-sodium cheeses.  Fats and Oils Tub margarines without trans fats. Light or reduced-fat mayonnaise and salad dressings (reduced sodium). Avocado. Safflower, olive, or canola oils. Natural peanut or almond butter.  Other Unsalted popcorn and pretzels. The items listed above may not be a complete list of recommended foods or beverages. Contact your dietitian for more options.  WHAT FOODS ARE NOT RECOMMENDED?  Grains White bread. White pasta. White rice. Refined cornbread. Bagels and croissants. Crackers that contain trans fat.  Vegetables Creamed or fried vegetables. Vegetables in a cheese sauce. Regular canned vegetables. Regular canned tomato sauce and paste. Regular tomato and vegetable juices.  Fruits Dried fruits. Canned fruit in light or heavy syrup. Fruit juice.  Meat and Other Protein Products Fatty cuts of meat. Ribs, chicken wings, bacon, sausage, bologna, salami, chitterlings, fatback, hot dogs, bratwurst, and packaged luncheon meats. Salted nuts and seeds. Canned beans with salt.    Dairy Whole or 2% milk, cream, half-and-half, and cream cheese. Whole-fat or sweetened yogurt. Full-fat cheeses or blue cheese. Nondairy creamers and whipped toppings. Processed cheese,  cheese spreads, or cheese curds.  Condiments Onion and garlic salt, seasoned salt, table salt, and sea salt. Canned and packaged gravies. Worcestershire sauce. Tartar sauce. Barbecue sauce. Teriyaki sauce. Soy sauce, including reduced sodium. Steak sauce. Fish sauce. Oyster sauce. Cocktail sauce. Horseradish. Ketchup and mustard. Meat flavorings and tenderizers. Bouillon cubes. Hot sauce. Tabasco sauce. Marinades. Taco seasonings. Relishes.  Fats and Oils Butter, stick margarine, lard, shortening, ghee, and bacon fat. Coconut, palm kernel, or palm oils. Regular salad dressings.  Other Pickles and olives. Salted popcorn and pretzels.  The items listed above may not be a complete list of foods and beverages to avoid. Contact your dietitian for more information.  WHERE CAN I FIND MORE INFORMATION? National Heart, Lung, and Blood Institute: www.nhlbi.nih.gov/health/health-topics/topics/dash/ Document Released: 09/14/2011 Document Revised: 02/09/2014 Document Reviewed: 07/30/2013 ExitCare Patient Information 2015 [employer]. This information is not intended to replace advice given to you by your health care provider. Make sure you discuss any questions you have with your health care provider.   I think that you would greatly benefit from seeing a nutritionist.  If you are interested, please call Dr Sykes at [phone] to schedule an appointment.   

## 2019-07-09 LAB — CMP14+EGFR
ALT: 14 IU/L (ref 0–44)
AST: 20 IU/L (ref 0–40)
Albumin/Globulin Ratio: 2.3 — ABNORMAL HIGH (ref 1.2–2.2)
Albumin: 4.3 g/dL (ref 3.7–4.7)
Alkaline Phosphatase: 77 IU/L (ref 39–117)
BUN/Creatinine Ratio: 19 (ref 10–24)
BUN: 21 mg/dL (ref 8–27)
Bilirubin Total: 0.5 mg/dL (ref 0.0–1.2)
CO2: 23 mmol/L (ref 20–29)
Calcium: 9.8 mg/dL (ref 8.6–10.2)
Chloride: 100 mmol/L (ref 96–106)
Creatinine, Ser: 1.12 mg/dL (ref 0.76–1.27)
GFR calc Af Amer: 72 mL/min/{1.73_m2} (ref >59)
GFR calc non Af Amer: 63 mL/min/{1.73_m2} (ref >59)
Globulin, Total: 1.9 g/dL (ref 1.5–4.5)
Glucose: 99 mg/dL (ref 65–99)
Potassium: 3.9 mmol/L (ref 3.5–5.2)
Sodium: 141 mmol/L (ref 134–144)
Total Protein: 6.2 g/dL (ref 6.0–8.5)

## 2019-07-09 LAB — VITAMIN D 25 HYDROXY (VIT D DEFICIENCY, FRACTURES): Vit D, 25-Hydroxy: 44.1 ng/mL (ref 30.0–100.0)

## 2019-07-09 LAB — LIPID PANEL
Chol/HDL Ratio: 3.9 ratio (ref 0.0–5.0)
Cholesterol, Total: 173 mg/dL (ref 100–199)
HDL: 44 mg/dL (ref >39)
LDL Chol Calc (NIH): 109 mg/dL — ABNORMAL HIGH (ref 0–99)
Triglycerides: 107 mg/dL (ref 0–149)
VLDL Cholesterol Cal: 20 mg/dL (ref 5–40)

## 2019-07-09 LAB — CBC WITH DIFFERENTIAL/PLATELET
Basophils Absolute: 0 10*3/uL (ref 0.0–0.2)
Basos: 1 %
EOS (ABSOLUTE): 0.1 10*3/uL (ref 0.0–0.4)
Eos: 2 %
Hematocrit: 39.6 % (ref 37.5–51.0)
Hemoglobin: 13.2 g/dL (ref 13.0–17.7)
Immature Grans (Abs): 0 10*3/uL (ref 0.0–0.1)
Immature Granulocytes: 0 %
Lymphocytes Absolute: 1.3 10*3/uL (ref 0.7–3.1)
Lymphs: 27 %
MCH: 29.5 pg (ref 26.6–33.0)
MCHC: 33.3 g/dL (ref 31.5–35.7)
MCV: 89 fL (ref 79–97)
Monocytes Absolute: 0.6 10*3/uL (ref 0.1–0.9)
Monocytes: 12 %
Neutrophils Absolute: 2.8 10*3/uL (ref 1.4–7.0)
Neutrophils: 58 %
Platelets: 203 10*3/uL (ref 150–450)
RBC: 4.47 x10E6/uL (ref 4.14–5.80)
RDW: 13.1 % (ref 11.6–15.4)
WBC: 4.8 10*3/uL (ref 3.4–10.8)

## 2019-07-09 LAB — THYROID PANEL WITH TSH
Free Thyroxine Index: 2.4 (ref 1.2–4.9)
T3 Uptake Ratio: 31 % (ref 24–39)
T4, Total: 7.8 ug/dL (ref 4.5–12.0)
TSH: 0.976 u[IU]/mL (ref 0.450–4.500)

## 2019-07-16 DIAGNOSIS — L304 Erythema intertrigo: Secondary | ICD-10-CM | POA: Diagnosis not present

## 2019-07-16 DIAGNOSIS — L821 Other seborrheic keratosis: Secondary | ICD-10-CM | POA: Diagnosis not present

## 2019-07-16 DIAGNOSIS — L738 Other specified follicular disorders: Secondary | ICD-10-CM | POA: Diagnosis not present

## 2019-07-16 DIAGNOSIS — D225 Melanocytic nevi of trunk: Secondary | ICD-10-CM | POA: Diagnosis not present

## 2019-07-21 ENCOUNTER — Other Ambulatory Visit: Payer: Self-pay | Admitting: Adult Health

## 2019-07-30 ENCOUNTER — Encounter: Payer: Self-pay | Admitting: Family Medicine

## 2019-07-30 ENCOUNTER — Ambulatory Visit (INDEPENDENT_AMBULATORY_CARE_PROVIDER_SITE_OTHER): Payer: Medicare Other | Admitting: Family Medicine

## 2019-07-30 ENCOUNTER — Telehealth: Payer: Self-pay | Admitting: Family Medicine

## 2019-07-30 ENCOUNTER — Other Ambulatory Visit: Payer: Self-pay

## 2019-07-30 VITALS — BP 150/88 | HR 65 | Temp 97.8°F | Resp 20 | Ht 67.0 in | Wt 198.0 lb

## 2019-07-30 DIAGNOSIS — F418 Other specified anxiety disorders: Secondary | ICD-10-CM | POA: Diagnosis not present

## 2019-07-30 DIAGNOSIS — F4321 Adjustment disorder with depressed mood: Secondary | ICD-10-CM

## 2019-07-30 DIAGNOSIS — N6320 Unspecified lump in the left breast, unspecified quadrant: Secondary | ICD-10-CM

## 2019-07-30 DIAGNOSIS — I10 Essential (primary) hypertension: Secondary | ICD-10-CM | POA: Diagnosis not present

## 2019-07-30 MED ORDER — FLUOXETINE HCL 20 MG PO TABS: 20.0000 mg | ORAL_TABLET | Freq: Every day | ORAL | 3 refills | Status: DC

## 2019-07-30 NOTE — Patient Instructions (Signed)
If your symptoms worsen or you have thoughts of suicide/homicide, PLEASE SEEK IMMEDIATE MEDICAL ATTENTION.  You may always call the National Suicide Hotline.  This is available 24 hours a day, 7 days a week.  Their number is: 1-800-273-8255  Taking the medicine as directed and not missing any doses is one of the best things you can do to treat your depression.  Here are some things to keep in mind:  1) Side effects (stomach upset, some increased anxiety) may happen before you notice a benefit.  These side effects typically go away over time. 2) Changes to your dose of medicine or a change in medication all together is sometimes necessary 3) Most people need to be on medication at least 12 months 4) Many people will notice an improvement within two weeks but the full effect of the medication can take up to 4-6 weeks 5) Stopping the medication when you start feeling better often results in a return of symptoms 6) Never discontinue your medication without contacting a health care professional first.  Some medications require gradual discontinuation/ taper and can make you sick if you stop them abruptly.  If your symptoms worsen or you have thoughts of suicide/homicide, PLEASE SEEK IMMEDIATE MEDICAL ATTENTION.  You may always call:  National Suicide Hotline: 800-273-8255 [place] Crisis Line: 336-832-9700 Crisis Recovery in [place]: 800-939-5911   These are available 24 hours a day, 7 days a week.   

## 2019-07-31 ENCOUNTER — Encounter: Payer: Self-pay | Admitting: Family Medicine

## 2019-07-31 MED ORDER — FLUOXETINE HCL 20 MG PO TABS: 20.0000 mg | ORAL_TABLET | Freq: Every day | ORAL | 2 refills | Status: DC

## 2019-07-31 NOTE — Progress Notes (Addendum)
Subjective:  Patient ID: Ricky [name], male    DOB: Feb 06, 1941, 78 y.o.   MRN: [id number]  Patient Care Team: Baruch Gouty, FNP as PCP - General (Family Medicine) Celene Squibb, MD as Consulting Physician (Dermatology) Tanda Rockers, MD as Consulting Physician (Pulmonary Disease) Kathie Rhodes, MD as Consulting Physician (Urology) Rutherford Guys, MD as Consulting Physician (Ophthalmology)   Chief Complaint:  Depression and Anxiety   HPI: Ricky [name] is a 78 y.o. male presenting on 07/30/2019 for Depression and Anxiety   Pt presents to the office today with his son for worsening anxiety and depression. Pt has a history of memory impairment. Pt states he is going through a very stressful divorce and has been kicked out of his house and this has caused him great anxiety and stress. States he is living in his son's basement and does not like this situation. States he feels confined in a small place. Pt states he is very paranoid and anxious states he is having trouble sleeping at night due to anxiety. States he feels scared at night, feels someone is going to steal his money or belongings. He denies SI or HI.  His son reports his symptoms have increased dramatically over the last week. Feels this is due to his living situation and the stress of the divorce.  He reports his blood pressure has been elevated and feels this may be due to the increased anxiety and depression.   Pt also reports a lump on his left nipple. States this has been there for a while. States he thought this would go away but is has not. No pain, drainage, erythema, or injury.   Depression screen [facility] 2/9 07/30/2019 07/08/2019 05/15/2019 05/01/2019 12/11/2018  Decreased Interest 3 0 0 0 0  Down, Depressed, Hopeless 1 0 0 1 1  PHQ - 2 Score 4 0 0 1 1  Altered sleeping 1 - - - -  Tired, decreased energy 2 - - - -  Change in appetite 0 - - - -  Feeling bad or failure about yourself  1 - - - -  Trouble concentrating 2 - - - -   Moving slowly or fidgety/restless 2 - - - -  Suicidal thoughts 0 - - - -  PHQ-9 Score 12 - - - -  Difficult doing work/chores Somewhat difficult - - - -   GAD 7 : Generalized Anxiety Score 07/30/2019  Nervous, Anxious, on Edge 1  Control/stop worrying 2  Worry too much - different things 1  Trouble relaxing 2  Restless 2  Easily annoyed or irritable 1  Afraid - awful might happen 3  Total GAD 7 Score 12  Anxiety Difficulty Somewhat difficult      Relevant past medical, surgical, family, and social history reviewed and updated as indicated.  Allergies and medications reviewed and updated. Date reviewed: Chart in Epic.   Past Medical History:  Diagnosis Date  . Chronic cough   . Deviated nasal septum   . 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 Family history of prostate cancer   . GERD (gastroesophageal reflux disease)   . History of adenomatous polyp of colon    2008; 2011  . History of exercise stress test    01-14-2009 by dr hochrein-- normal w/ no ischemia  . Hot flashes    DUE TO HORMONE THERAPY FOR PROSTATE CANCER  . Hyperlipidemia   . Hypertension   . Mild memory disturbance  per dr Jannifer Franklin note (neurologist)  . Nocturia more than twice per night   . Nocturnal leg cramps 03/23/2017  . OA (osteoarthritis)   . OSA (obstructive sleep apnea)    moderate osa per study 02-12-2017  cpap recommended--- per pt never heard back from doctor for obtaining cpap  . Pneumonia 02/2018  . Prostate cancer [facility]) urologist-  dr ottelin/  oncologist-  dr Tammi Klippel   dx 10-10-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, Gleason 3+4, PSA 9.02, vol 78cc started ADT to decrease size;  08/ 2018 prostate decrease size vol 46cc and PSA 0.36--- scheduled for radiative prostate seed implants    Past Surgical History:  Procedure Laterality Date  . CATARACT EXTRACTION W/ INTRAOCULAR LENS  IMPLANT, BILATERAL  2016  . COLONOSCOPY  last one 08-08-2013  . CYSTOSCOPY N/A 08/10/2017   Procedure:  CYSTOSCOPY FLEXIBLE;  Surgeon: Kathie Rhodes, MD;  Location: [facility];  Service: Urology;  Laterality: N/A;  NO SEEDS FOUND IN BLADDER  . HAND SURGERY Left 1992  . KNEE ARTHROSCOPY Bilateral 1960's and 70's  . PROSTATE BIOPSY  10-10-2016   dr Karsten Ro office  . RADIOACTIVE SEED IMPLANT N/A 08/10/2017   Procedure: RADIOACTIVE SEED IMPLANT/BRACHYTHERAPY IMPLANT;  Surgeon: Kathie Rhodes, MD;  Location: [facility];  Service: Urology;  Laterality: N/A;    64   SEEDS IMPLANTED  . ROTATOR CUFF REPAIR Right 2004  . SPACE OAR INSTILLATION N/A 08/10/2017   Procedure: SPACE OAR INSTILLATION;  Surgeon: Kathie Rhodes, MD;  Location: [facility];  Service: Urology;  Laterality: N/A;  . TOE SURGERY Left 1970   little toe  . TOTAL KNEE ARTHROPLASTY Bilateral 01-25-2009   dr Wynelle Link [facility]    Social History   Socioeconomic History  . Marital status: Married    Spouse name: [name]  . Number of children: 3  . Years of education: BA  . Highest education level: Not on file  Occupational History  . Occupation: Retired  Scientific laboratory technician  . Financial resource strain: Not on file  . Food insecurity    Worry: Not on file    Inability: Not on file  . Transportation needs    Medical: Not on file    Non-medical: Not on file  Tobacco Use  . Smoking status: Never Smoker  . Smokeless tobacco: Never Used  Substance and Sexual Activity  . Alcohol use: Yes    Comment: rare  . Drug use: No  . Sexual activity: Yes    Partners: Female    Comment: vasectomy  Lifestyle  . Physical activity    Days per week: Not on file    Minutes per session: Not on file  . Stress: Not on file  Relationships  . Social Herbalist on phone: Not on file    Gets together: Not on file    Attends religious service: Not on file    Active member of club or organization: Not on file    Attends meetings of clubs or organizations: Not on file    Relationship status: Not on file  .  Intimate partner violence    Fear of current or ex partner: Not on file    Emotionally abused: Not on file    Physically abused: Not on file    Forced sexual activity: Not on file  Other Topics Concern  . Not on file  Social History Narrative   Lives at home w/ his wife, patient has left his wife and is  now staying with his son [name] as of 03/19/2019   Right-handed   Caffeine: none    Outpatient Encounter Medications as of 07/30/2019  Medication Sig  . Cholecalciferol (VITAMIN D PO) Take 1 capsule by mouth daily.   Marland Kitchen FIBER FORMULA PO Take 3 capsules by mouth 2 (two) times daily.  . memantine (NAMENDA) 5 MG tablet TAKE 1 TABLET IN THE AM AND 2 TABLETS IN THE PM  . spironolactone (ALDACTONE) 50 MG tablet Take 1 tablet (50 mg total) by mouth daily.  Marland Kitchen tolterodine (DETROL LA) 4 MG 24 hr capsule Take 4 mg by mouth at bedtime.   Marland Kitchen FLUoxetine (PROZAC) 20 MG tablet Take 1 tablet (20 mg total) by mouth daily.  . [DISCONTINUED] FLUoxetine (PROZAC) 20 MG tablet Take 1 tablet (20 mg total) by mouth daily.  . [DISCONTINUED] FLUoxetine (PROZAC) 20 MG tablet Take 1 tablet (20 mg total) by mouth daily.  . [DISCONTINUED] levofloxacin (LEVAQUIN) 750 MG tablet TAKE 1 TABLET BY MOUTH THE MORNING OF YOUR PROSTATE BIOPSY.   No facility-administered encounter medications on file as of 07/30/2019.     No Known Allergies  Review of Systems  Constitutional: Negative for activity change, appetite change, chills, diaphoresis, fatigue, fever and unexpected weight change.  HENT: Negative.   Eyes: Negative.  Negative for visual disturbance.  Respiratory: Negative for cough, chest tightness and shortness of breath.   Cardiovascular: Negative for chest pain, palpitations and leg swelling.  Gastrointestinal: Negative for abdominal pain, blood in stool, constipation, diarrhea, nausea and vomiting.  Endocrine: Negative.  Negative for cold intolerance, heat intolerance, polydipsia, polyphagia and polyuria.   Genitourinary: Negative for decreased urine volume, difficulty urinating, dysuria, frequency and urgency.  Musculoskeletal: Negative for arthralgias and myalgias.  Skin: Negative.  Negative for color change and pallor.  Allergic/Immunologic: Negative.   Neurological: Negative for dizziness, tremors, seizures, syncope, facial asymmetry, speech difficulty, weakness, light-headedness, numbness and headaches.  Hematological: Negative.   Psychiatric/Behavioral: Positive for agitation, behavioral problems, confusion (baseline), decreased concentration, dysphoric mood and sleep disturbance. Negative for hallucinations, self-injury and suicidal ideas. The patient is nervous/anxious. The patient is not hyperactive.   All other systems reviewed and are negative.       Objective:  BP (!) 150/88   Pulse 65   Temp 97.8 F (36.6 C)   Resp 20   Ht 5' 7" (1.702 m)   Wt 198 lb (89.8 kg)   SpO2 94%   BMI 31.01 kg/m    Wt Readings from Last 3 Encounters:  07/30/19 198 lb (89.8 kg)  07/08/19 202 lb (91.6 kg)  06/02/19 205 lb 12.8 oz (93.4 kg)    Physical Exam Vitals signs and nursing note reviewed.  Constitutional:      General: He is not in acute distress.    Appearance: Normal appearance. He is well-developed and well-groomed. He is obese. He is not ill-appearing, toxic-appearing or diaphoretic.  HENT:     Head: Normocephalic and atraumatic.     Jaw: There is normal jaw occlusion.     Right Ear: Hearing normal.     Left Ear: Hearing normal.     Nose: Nose normal.     Mouth/Throat:     Lips: Pink.     Mouth: Mucous membranes are moist.     Pharynx: Oropharynx is clear. Uvula midline.  Eyes:     General: Lids are normal.     Extraocular Movements: Extraocular movements intact.     Conjunctiva/sclera: Conjunctivae normal.  Pupils: Pupils are equal, round, and reactive to light.  Neck:     Musculoskeletal: Normal range of motion and neck supple.     Thyroid: No thyroid mass,  thyromegaly or thyroid tenderness.     Vascular: No carotid bruit or JVD.     Trachea: Trachea and phonation normal.  Cardiovascular:     Rate and Rhythm: Normal rate and regular rhythm.     Chest Wall: PMI is not displaced.     Pulses: Normal pulses.     Heart sounds: Normal heart sounds. No murmur. No friction rub. No gallop.   Pulmonary:     Effort: Pulmonary effort is normal. No respiratory distress.     Breath sounds: Normal breath sounds. No wheezing.  Chest:     Breasts:        Right: Normal.        Left: Inverted nipple and mass present. No swelling, bleeding, nipple discharge, skin change or tenderness.    Abdominal:     General: Bowel sounds are normal. There is no distension or abdominal bruit.     Palpations: Abdomen is soft. There is no hepatomegaly or splenomegaly.     Tenderness: There is no abdominal tenderness. There is no right CVA tenderness or left CVA tenderness.     Hernia: No hernia is present.  Musculoskeletal: Normal range of motion.     Right lower leg: No edema.     Left lower leg: No edema.  Lymphadenopathy:     Cervical: No cervical adenopathy.  Skin:    General: Skin is warm and dry.     Capillary Refill: Capillary refill takes less than 2 seconds.     Coloration: Skin is not cyanotic, jaundiced or pale.     Findings: No rash.  Neurological:     General: No focal deficit present.     Mental Status: He is alert. Mental status is at baseline.     Cranial Nerves: Cranial nerves are intact.     Sensory: Sensation is intact.     Motor: Motor function is intact.     Coordination: Coordination is intact.     Gait: Gait is intact.     Deep Tendon Reflexes: Reflexes are normal and symmetric.  Psychiatric:        Attention and Perception: Attention normal.        Mood and Affect: Mood is anxious. Affect is tearful.        Speech: Speech is delayed.        Behavior: Behavior normal. Behavior is cooperative.        Thought Content: Thought content is  paranoid.        Cognition and Memory: Cognition is impaired. Memory is impaired.        Judgment: Judgment normal.     Results for orders placed or performed in visit on 07/08/19  CMP14+EGFR  Result Value Ref Range   Glucose 99 65 - 99 mg/dL   BUN 21 8 - 27 mg/dL   Creatinine, Ser 1.12 0.76 - 1.27 mg/dL   GFR calc non Af Amer 63 >59 mL/min/1.73   GFR calc Af Amer 72 >59 mL/min/1.73   BUN/Creatinine Ratio 19 10 - 24   Sodium 141 134 - 144 mmol/L   Potassium 3.9 3.5 - 5.2 mmol/L   Chloride 100 96 - 106 mmol/L   CO2 23 20 - 29 mmol/L   Calcium 9.8 8.6 - 10.2 mg/dL   Total Protein 6.2 6.0 -  8.5 g/dL   Albumin 4.3 3.7 - 4.7 g/dL   Globulin, Total 1.9 1.5 - 4.5 g/dL   Albumin/Globulin Ratio 2.3 (H) 1.2 - 2.2   Bilirubin Total 0.5 0.0 - 1.2 mg/dL   Alkaline Phosphatase 77 39 - 117 IU/L   AST 20 0 - 40 IU/L   ALT 14 0 - 44 IU/L  CBC with Differential/Platelet  Result Value Ref Range   WBC 4.8 3.4 - 10.8 x10E3/uL   RBC 4.47 4.14 - 5.80 x10E6/uL   Hemoglobin 13.2 13.0 - 17.7 g/dL   Hematocrit 39.6 37.5 - 51.0 %   MCV 89 79 - 97 fL   MCH 29.5 26.6 - 33.0 pg   MCHC 33.3 31.5 - 35.7 g/dL   RDW 13.1 11.6 - 15.4 %   Platelets 203 150 - 450 x10E3/uL   Neutrophils 58 Not Estab. %   Lymphs 27 Not Estab. %   Monocytes 12 Not Estab. %   Eos 2 Not Estab. %   Basos 1 Not Estab. %   Neutrophils Absolute 2.8 1.4 - 7.0 x10E3/uL   Lymphocytes Absolute 1.3 0.7 - 3.1 x10E3/uL   Monocytes Absolute 0.6 0.1 - 0.9 x10E3/uL   EOS (ABSOLUTE) 0.1 0.0 - 0.4 x10E3/uL   Basophils Absolute 0.0 0.0 - 0.2 x10E3/uL   Immature Granulocytes 0 Not Estab. %   Immature Grans (Abs) 0.0 0.0 - 0.1 x10E3/uL  Lipid panel  Result Value Ref Range   Cholesterol, Total 173 100 - 199 mg/dL   Triglycerides 107 0 - 149 mg/dL   HDL 44 >39 mg/dL   VLDL Cholesterol Cal 20 5 - 40 mg/dL   LDL Chol Calc (NIH) 109 (H) 0 - 99 mg/dL   Chol/HDL Ratio 3.9 0.0 - 5.0 ratio  Thyroid Panel With TSH  Result Value Ref Range   TSH  0.976 0.450 - 4.500 uIU/mL   T4, Total 7.8 4.5 - 12.0 ug/dL   T3 Uptake Ratio 31 24 - 39 %   Free Thyroxine Index 2.4 1.2 - 4.9  VITAMIN D 25 Hydroxy (Vit-D Deficiency, Fractures)  Result Value Ref Range   Vit D, 25-Hydroxy 44.1 30.0 - 100.0 ng/mL       Pertinent labs & imaging results that were available during my care of the patient were reviewed by me and considered in my medical decision making.  Assessment & Plan:  [name] was seen today for depression and anxiety.  Diagnoses and all orders for this visit:  Essential hypertension BP fairly controlled. Changes were not made in regimen today. Goal BP is 130/80. Pt aware to report any persistent high or low readings. DASH diet and exercise encouraged. Exercise at least 150 minutes per week and increase as tolerated. Goal BMI > 25. Stress management encouraged. Avoid nicotine and tobacco product use. Avoid excessive alcohol and NSAID's. Avoid more than 2000 mg of sodium daily. Medications as prescribed. Follow up as scheduled.   Situational anxiety Situational depression Worsening anxiety and depression due to living situation, change in health, and divorce. Pt willing to see psychiatry. Will initiate fluoxetine today. Pt and son aware to report any new or worsening symptoms. Crisis hotline numbers provided.  -     Ambulatory referral to Psychiatry -     FLUoxetine (PROZAC) 20 MG tablet; Take 1 tablet (20 mg total) by mouth daily.  Left breast mass Approximate 2 cm x 2 cm solid mass to left breast, will order imaging.  -     US BREAST COMPLETE  UNI LEFT INC AXILLA; Future   Total time spent with patient 65 minutes.  Greater than 50% of encounter spent in coordination of care/counseling.   Continue all other maintenance medications.  Follow up plan: Return in about 2 weeks (around 08/13/2019), or if symptoms worsen or fail to improve.  Continue healthy lifestyle choices, including diet (rich in fruits, vegetables, and lean  proteins, and low in salt and simple carbohydrates) and exercise (at least 30 minutes of moderate physical activity daily).  Educational handout given for depression   The above assessment and management plan was discussed with the patient. The patient verbalized understanding of and has agreed to the management plan. Patient is aware to call the clinic if they develop any new symptoms or if symptoms persist or worsen. Patient is aware when to return to the clinic for a follow-up visit. Patient educated on when it is appropriate to go to the emergency department.   Monia Pouch, FNP-C [facility] [phone]

## 2019-08-04 ENCOUNTER — Other Ambulatory Visit: Payer: Self-pay

## 2019-08-04 ENCOUNTER — Telehealth: Payer: Self-pay | Admitting: Family Medicine

## 2019-08-04 DIAGNOSIS — N6320 Unspecified lump in the left breast, unspecified quadrant: Secondary | ICD-10-CM

## 2019-08-04 NOTE — Telephone Encounter (Signed)
Have him increase the fluoxetine to 2 pills (40 mg) and check in With Peggs toward the end of the week.(I can not talk to the son if hee is not on DPR.)

## 2019-08-04 NOTE — Telephone Encounter (Signed)
Aware and verbalizes understanding.  

## 2019-08-04 NOTE — Telephone Encounter (Signed)
Patient of [name]. Are you willing to review or would you like it to wait for [name] on Wednesday

## 2019-08-05 ENCOUNTER — Other Ambulatory Visit: Payer: Self-pay

## 2019-08-05 ENCOUNTER — Encounter: Payer: Self-pay | Admitting: Adult Health

## 2019-08-05 ENCOUNTER — Ambulatory Visit: Payer: Medicare Other | Admitting: Adult Health

## 2019-08-05 VITALS — BP 168/98 | HR 64 | Temp 97.9°F | Ht 62.0 in | Wt 202.0 lb

## 2019-08-05 DIAGNOSIS — F410 Panic disorder [episodic paroxysmal anxiety] without agoraphobia: Secondary | ICD-10-CM

## 2019-08-05 DIAGNOSIS — Z5181 Encounter for therapeutic drug level monitoring: Secondary | ICD-10-CM | POA: Diagnosis not present

## 2019-08-05 DIAGNOSIS — R413 Other amnesia: Secondary | ICD-10-CM

## 2019-08-05 MED ORDER — MEMANTINE HCL 10 MG PO TABS: 10.0000 mg | ORAL_TABLET | Freq: Two times a day (BID) | ORAL | 11 refills | Status: DC

## 2019-08-05 NOTE — Progress Notes (Signed)
PATIENT: Ricky [name] DOB: Feb 17, 1941  REASON FOR VISIT: follow up HISTORY FROM: patient  HISTORY OF PRESENT ILLNESS: Today 08/05/19:  [name] is a 78 year old male with a history of memory disturbance.  He returns today for follow-up.  His son accompanies him today.  He returns for sooner visit after son reporting more confusion and panic attacks.  He states that the patient moved in with his son approximately 4 weeks ago.  He states that the first week he was there he did fine.  After that he has been having panic attacks.  His primary care recently increased his Prozac to 40 mg.  He has been taking the increased dose for 1 week.  The patient is able to complete ADLs independently.  He remains on Namenda 5 mg in the morning and 10 mg in the evening.  Returns today for evaluation.  HISTORY 06/02/19:  [name] is a 78 year old male with a history of memory disturbance.  He returns today for follow-up.  He is with his son today.  He reports that he is able to complete all ADLs independently.  His son manages his finances.  He is currently in the process of getting a divorce from his wife.  He plans to live alone after she moves out.  He does operate a motor vehicle but only drives to familiar places in [place].  He does not do much cooking but does plan to use the microwave once his wife moves out.  His son also brings them meals.  He denies any trouble sleeping.  Denies any changes with his mood or behavior.  He returns today for an evaluation.   REVIEW OF SYSTEMS: Out of a complete 14 system review of symptoms, the patient complains only of the following symptoms, and all other reviewed systems are negative.  See HPI  ALLERGIES: No Known Allergies  HOME MEDICATIONS: Outpatient Medications Prior to Visit  Medication Sig Dispense Refill  . Cholecalciferol (VITAMIN D PO) Take 1 capsule by mouth daily.     Marland Kitchen FIBER FORMULA PO Take 3 capsules by mouth 2 (two) times daily.    Marland Kitchen  FLUoxetine (PROZAC) 20 MG tablet Take 1 tablet (20 mg total) by mouth daily. 30 tablet 2  . memantine (NAMENDA) 5 MG tablet TAKE 1 TABLET IN THE AM AND 2 TABLETS IN THE PM 270 tablet 3  . spironolactone (ALDACTONE) 50 MG tablet Take 1 tablet (50 mg total) by mouth daily. 90 tablet 0  . tolterodine (DETROL LA) 4 MG 24 hr capsule Take 4 mg by mouth at bedtime.   10   No facility-administered medications prior to visit.     PAST MEDICAL HISTORY: Past Medical History:  Diagnosis Date  . Chronic cough   . Deviated nasal septum   . 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 Family history of prostate cancer   . GERD (gastroesophageal reflux disease)   . History of adenomatous polyp of colon    2008; 2011  . History of exercise stress test    01-14-2009 by dr hochrein-- normal w/ no ischemia  . Hot flashes    DUE TO HORMONE THERAPY FOR PROSTATE CANCER  . Hyperlipidemia   . Hypertension   . Mild memory disturbance    per dr Jannifer Franklin note (neurologist)  . Nocturia more than twice per night   . Nocturnal leg cramps 03/23/2017  . OA (osteoarthritis)   . OSA (obstructive sleep apnea)  moderate osa per study 02-12-2017  cpap recommended--- per pt never heard back from doctor for obtaining cpap  . Pneumonia 02/2018  . Prostate cancer [facility]) urologist-  dr ottelin/  oncologist-  dr Tammi Klippel   dx 10-10-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, Gleason 3+4, PSA 9.02, vol 78cc started ADT to decrease size;  08/ 2018 prostate decrease size vol 46cc and PSA 0.36--- scheduled for radiative prostate seed implants    PAST SURGICAL HISTORY: Past Surgical History:  Procedure Laterality Date  . CATARACT EXTRACTION W/ INTRAOCULAR LENS  IMPLANT, BILATERAL  2016  . COLONOSCOPY  last one 08-08-2013  . CYSTOSCOPY N/A 08/10/2017   Procedure: CYSTOSCOPY FLEXIBLE;  Surgeon: Kathie Rhodes, MD;  Location: [facility];  Service: Urology;  Laterality: N/A;  NO SEEDS FOUND IN BLADDER  . HAND SURGERY Left  1992  . KNEE ARTHROSCOPY Bilateral 1960's and 70's  . PROSTATE BIOPSY  10-10-2016   dr Karsten Ro office  . RADIOACTIVE SEED IMPLANT N/A 08/10/2017   Procedure: RADIOACTIVE SEED IMPLANT/BRACHYTHERAPY IMPLANT;  Surgeon: Kathie Rhodes, MD;  Location: [facility];  Service: Urology;  Laterality: N/A;    64   SEEDS IMPLANTED  . ROTATOR CUFF REPAIR Right 2004  . SPACE OAR INSTILLATION N/A 08/10/2017   Procedure: SPACE OAR INSTILLATION;  Surgeon: Kathie Rhodes, MD;  Location: [facility];  Service: Urology;  Laterality: N/A;  . TOE SURGERY Left 1970   little toe  . TOTAL KNEE ARTHROPLASTY Bilateral 01-25-2009   dr Wynelle [facility]    FAMILY HISTORY: Family History  Problem Relation Age of Onset  . Healthy Mother   . Prostate cancer Father        Dx 34s; Deceased 40  . Prostate cancer Brother 54       currently 65  . Alcohol abuse Brother   . Bipolar disorder Brother   . Prostate cancer Paternal Uncle        unsure of age; possibly 2nd uncle also has prostate ca  . Prostate cancer Maternal Uncle        unsure of age  . Breast cancer Maternal Aunt        unsure of age  . Colon cancer Neg Hx   . Esophageal cancer Neg Hx   . Rectal cancer Neg Hx   . Stomach cancer Neg Hx     SOCIAL HISTORY: Social History   Socioeconomic History  . Marital status: Married    Spouse name: [name]  . Number of children: 3  . Years of education: BA  . Highest education level: Not on file  Occupational History  . Occupation: Retired  Scientific laboratory technician  . Financial resource strain: Not on file  . Food insecurity    Worry: Not on file    Inability: Not on file  . Transportation needs    Medical: Not on file    Non-medical: Not on file  Tobacco Use  . Smoking status: Never Smoker  . Smokeless tobacco: Never Used  Substance and Sexual Activity  . Alcohol use: Yes    Comment: rare  . Drug use: No  . Sexual activity: Yes    Partners: Female    Comment: vasectomy  Lifestyle  .  Physical activity    Days per week: Not on file    Minutes per session: Not on file  . Stress: Not on file  Relationships  . Social Herbalist on phone: Not on file    Gets together:  Not on file    Attends religious service: Not on file    Active member of club or organization: Not on file    Attends meetings of clubs or organizations: Not on file    Relationship status: Not on file  . Intimate partner violence    Fear of current or ex partner: Not on file    Emotionally abused: Not on file    Physically abused: Not on file    Forced sexual activity: Not on file  Other Topics Concern  . Not on file  Social History Narrative   Lives at home w/ his wife, patient has left his wife and is now staying with his son [name] as of 03/19/2019   Right-handed   Caffeine: none      PHYSICAL EXAM  Vitals:   08/05/19 1059 08/05/19 1120  BP: (!) 175/90 (!) 168/98  Pulse: 64   Temp: 97.9 F (36.6 C)   Weight: 202 lb (91.6 kg)   Height: 5\' 2"  (1.575 m)    Body mass index is 36.95 kg/m.  Generalized: Well developed, in no acute distress   Neurological examination  Mentation: Alert oriented to time, place, history taking. Follows all commands speech and language fluent Cranial nerve II-XII: Pupils were equal round reactive to light. Extraocular movements were full, visual field were full on confrontational test. Facial sensation and strength were normal. Uvula tongue midline. Head turning and shoulder shrug  were normal and symmetric. Motor: The motor testing reveals 5 over 5 strength of all 4 extremities. Good symmetric motor tone is noted throughout.  Sensory: Sensory testing is intact to soft touch on all 4 extremities. No evidence of extinction is noted.  Coordination: Cerebellar testing reveals good finger-nose-finger and heel-to-shin bilaterally.  Gait and station: Gait is normal.    DIAGNOSTIC DATA (LABS, IMAGING, TESTING) - I reviewed patient records, labs, notes,  testing and imaging myself where available.  Lab Results  Component Value Date   WBC 4.8 07/08/2019   HGB 13.2 07/08/2019   HCT 39.6 07/08/2019   MCV 89 07/08/2019   PLT 203 07/08/2019      Component Value Date/Time   NA 141 07/08/2019 1302   K 3.9 07/08/2019 1302   CL 100 07/08/2019 1302   CO2 23 07/08/2019 1302   GLUCOSE 99 07/08/2019 1302   GLUCOSE 96 05/02/2019 1010   BUN 21 07/08/2019 1302   CREATININE 1.12 07/08/2019 1302   CALCIUM 9.8 07/08/2019 1302   PROT 6.2 07/08/2019 1302   ALBUMIN 4.3 07/08/2019 1302   AST 20 07/08/2019 1302   ALT 14 07/08/2019 1302   ALKPHOS 77 07/08/2019 1302   BILITOT 0.5 07/08/2019 1302   GFRNONAA 63 07/08/2019 1302   GFRAA 72 07/08/2019 1302   Lab Results  Component Value Date   CHOL 173 07/08/2019   HDL 44 07/08/2019   LDLCALC 109 (H) 07/08/2019   TRIG 107 07/08/2019   CHOLHDL 3.9 07/08/2019   No results found for: HGBA1C Lab Results  Component Value Date   VITAMINB12 313 05/15/2019   Lab Results  Component Value Date   TSH 0.976 07/08/2019      ASSESSMENT AND PLAN 78 y.o. year old male  has a past medical history of Chronic cough, Deviated nasal septum, Enlarged prostate with lower urinary tract symptoms (LUTS), Family history of prostate cancer, GERD (gastroesophageal reflux disease), History of adenomatous polyp of colon, History of exercise stress test, Hot flashes, Hyperlipidemia, Hypertension, Mild memory disturbance, Nocturia more than  twice per night, Nocturnal leg cramps (03/23/2017), OA (osteoarthritis), OSA (obstructive sleep apnea), Pneumonia (02/2018), and Prostate cancer [facility]) (urologist-  dr ottelin/  oncologist-  dr Tammi Klippel). here with:  Dementia without behavioral disturbance   Memory score stable MMSE 17/30  Increased confusion and panic attacks could be due to change in environment?  I will check blood work and urinalysis to rule out electrolyte imbalance or possible infection.  Increase Namenda 10 mg  twice a day.  Side effects reviewed with patient and his son.  I have advised that if his symptoms worsen or he develops new symptoms he should let us know.  We will follow-up in 6 months or sooner if needed.    Ward Givens, MSN, NP-C 08/05/2019, 11:26 AM [facility] [address], [place], [place] [address] [phone]

## 2019-08-05 NOTE — Patient Instructions (Signed)
Your Plan:  Increase Namenda 10 mg twice a day Blood work today If your symptoms worsen or you develop new symptoms please let us know.   Thank you for coming to see [place] at [facility]. I hope we have been able to provide you high quality care today.  You may receive a patient satisfaction survey over the next few weeks. We would appreciate your feedback and comments so that we may continue to improve ourselves and the health of our patients.

## 2019-08-05 NOTE — Progress Notes (Signed)
I have read the note, and I agree with the clinical assessment and plan.  Charles K Willis   

## 2019-08-06 DIAGNOSIS — C61 Malignant neoplasm of prostate: Secondary | ICD-10-CM | POA: Diagnosis not present

## 2019-08-06 LAB — CBC WITH DIFFERENTIAL/PLATELET
Basophils Absolute: 0 10*3/uL (ref 0.0–0.2)
Basos: 0 %
EOS (ABSOLUTE): 0.1 10*3/uL (ref 0.0–0.4)
Eos: 1 %
Hematocrit: 42.5 % (ref 37.5–51.0)
Hemoglobin: 13.8 g/dL (ref 13.0–17.7)
Immature Grans (Abs): 0 10*3/uL (ref 0.0–0.1)
Immature Granulocytes: 0 %
Lymphocytes Absolute: 1.3 10*3/uL (ref 0.7–3.1)
Lymphs: 25 %
MCH: 28.8 pg (ref 26.6–33.0)
MCHC: 32.5 g/dL (ref 31.5–35.7)
MCV: 89 fL (ref 79–97)
Monocytes Absolute: 0.7 10*3/uL (ref 0.1–0.9)
Monocytes: 12 %
Neutrophils Absolute: 3.2 10*3/uL (ref 1.4–7.0)
Neutrophils: 62 %
Platelets: 208 10*3/uL (ref 150–450)
RBC: 4.79 x10E6/uL (ref 4.14–5.80)
RDW: 12 % (ref 11.6–15.4)
WBC: 5.3 10*3/uL (ref 3.4–10.8)

## 2019-08-06 LAB — COMPREHENSIVE METABOLIC PANEL
ALT: 17 IU/L (ref 0–44)
AST: 21 IU/L (ref 0–40)
Albumin/Globulin Ratio: 2.3 — ABNORMAL HIGH (ref 1.2–2.2)
Albumin: 4.3 g/dL (ref 3.7–4.7)
Alkaline Phosphatase: 76 IU/L (ref 39–117)
BUN/Creatinine Ratio: 16 (ref 10–24)
BUN: 21 mg/dL (ref 8–27)
Bilirubin Total: 0.5 mg/dL (ref 0.0–1.2)
CO2: 23 mmol/L (ref 20–29)
Calcium: 10 mg/dL (ref 8.6–10.2)
Chloride: 104 mmol/L (ref 96–106)
Creatinine, Ser: 1.28 mg/dL — ABNORMAL HIGH (ref 0.76–1.27)
GFR calc Af Amer: 62 mL/min/{1.73_m2} (ref >59)
GFR calc non Af Amer: 53 mL/min/{1.73_m2} — ABNORMAL LOW (ref >59)
Globulin, Total: 1.9 g/dL (ref 1.5–4.5)
Glucose: 80 mg/dL (ref 65–99)
Potassium: 3.8 mmol/L (ref 3.5–5.2)
Sodium: 141 mmol/L (ref 134–144)
Total Protein: 6.2 g/dL (ref 6.0–8.5)

## 2019-08-11 ENCOUNTER — Telehealth: Payer: Self-pay | Admitting: *Deleted

## 2019-08-11 NOTE — Telephone Encounter (Signed)
I called pt who handed me off to son, Javae Hathcoat.  I relayed that kidney function mildly impaired.  He sees [facility] , Dr.Sanford. Will forward to him as has appt coming up.

## 2019-08-11 NOTE — Telephone Encounter (Signed)
-----   Message from Ward Givens, NP sent at 08/11/2019  8:55 AM EST ----- Kidney function mildly impaired. Please send to PCP. Please call patient.

## 2019-08-12 DIAGNOSIS — C61 Malignant neoplasm of prostate: Secondary | ICD-10-CM | POA: Diagnosis not present

## 2019-08-13 DIAGNOSIS — E876 Hypokalemia: Secondary | ICD-10-CM | POA: Diagnosis not present

## 2019-08-13 DIAGNOSIS — I129 Hypertensive chronic kidney disease with stage 1 through stage 4 chronic kidney disease, or unspecified chronic kidney disease: Secondary | ICD-10-CM | POA: Diagnosis not present

## 2019-08-13 DIAGNOSIS — I951 Orthostatic hypotension: Secondary | ICD-10-CM | POA: Diagnosis not present

## 2019-08-13 DIAGNOSIS — N1832 Chronic kidney disease, stage 3b: Secondary | ICD-10-CM | POA: Diagnosis not present

## 2019-08-14 DIAGNOSIS — C61 Malignant neoplasm of prostate: Secondary | ICD-10-CM | POA: Diagnosis not present

## 2019-08-19 ENCOUNTER — Other Ambulatory Visit: Payer: Self-pay

## 2019-08-20 ENCOUNTER — Ambulatory Visit (INDEPENDENT_AMBULATORY_CARE_PROVIDER_SITE_OTHER): Payer: Medicare Other | Admitting: Family Medicine

## 2019-08-20 ENCOUNTER — Encounter: Payer: Self-pay | Admitting: Family Medicine

## 2019-08-20 VITALS — BP 162/102 | HR 73 | Temp 99.0°F | Resp 20 | Ht 62.0 in | Wt 194.0 lb

## 2019-08-20 DIAGNOSIS — F4321 Adjustment disorder with depressed mood: Secondary | ICD-10-CM | POA: Diagnosis not present

## 2019-08-20 DIAGNOSIS — F418 Other specified anxiety disorders: Secondary | ICD-10-CM | POA: Diagnosis not present

## 2019-08-20 DIAGNOSIS — C61 Malignant neoplasm of prostate: Secondary | ICD-10-CM

## 2019-08-20 DIAGNOSIS — N6320 Unspecified lump in the left breast, unspecified quadrant: Secondary | ICD-10-CM | POA: Diagnosis not present

## 2019-08-20 MED ORDER — FLUOXETINE HCL 40 MG PO CAPS: 40.0000 mg | ORAL_CAPSULE | Freq: Every day | ORAL | 3 refills | Status: DC

## 2019-08-20 MED ORDER — BUSPIRONE HCL 5 MG PO TABS: 5.0000 mg | ORAL_TABLET | Freq: Three times a day (TID) | ORAL | 0 refills | Status: DC | PRN

## 2019-08-20 NOTE — Progress Notes (Signed)
Subjective:  Patient ID: Ricky [name], male    DOB: 02-01-1941, 78 y.o.   MRN: [id number]  Patient Care Team: Baruch Gouty, FNP as PCP - General (Family Medicine) Celene Squibb, MD as Consulting Physician (Dermatology) Tanda Rockers, MD as Consulting Physician (Pulmonary Disease) Kathie Rhodes, MD as Consulting Physician (Urology) Rutherford Guys, MD as Consulting Physician (Ophthalmology) Rexene Agent, MD as Attending Physician (Nephrology)   Chief Complaint:  Anxiety (follow up )   HPI: Ricky [name] is a 78 y.o. male presenting on 08/20/2019 for Anxiety (follow up )   Pt following up today after initiation of medications for anxiety and depression. Pt states he feels the same. Pt has dementia and his son, who he lives with, is with him today. Pts son states he feels the anxiety has improved as the paranoia has decreased. Pt reports he still feels scared at times. Does not feel the medications have helped greatly with his symptoms.he does appear less anxious today. He is taking medications as prescribed without associated side effects. He has not follow up with [facility], has not received an appointment.  Anxiety Presents for follow-up visit. Symptoms include compulsions, confusion (pt has dementia), decreased concentration, depressed mood, excessive worry, insomnia, nervous/anxious behavior, obsessions and restlessness. Patient reports no chest pain, dizziness, dry mouth, feeling of choking, hyperventilation, impotence, irritability, malaise, muscle tension, nausea, palpitations, panic, shortness of breath or suicidal ideas. Symptoms occur most days. The severity of symptoms is moderate. The quality of sleep is fair. Nighttime awakenings: occasional.   Compliance with medications is 76-100%.    GAD 7 : Generalized Anxiety Score 08/20/2019 07/30/2019  Nervous, Anxious, on Edge 3 1  Control/stop worrying 3 2  Worry too much - different things 3 1  Trouble relaxing 2 2  Restless 2 2   Easily annoyed or irritable 1 1  Afraid - awful might happen 1 3  Total GAD 7 Score 15 12  Anxiety Difficulty Somewhat difficult Somewhat difficult    Depression screen [facility] 2/9 08/20/2019 07/30/2019 07/08/2019 05/15/2019 05/01/2019  Decreased Interest 3 3 0 0 0  Down, Depressed, Hopeless 2 1 0 0 1  PHQ - 2 Score 5 4 0 0 1  Altered sleeping 1 1 - - -  Tired, decreased energy 3 2 - - -  Change in appetite 0 0 - - -  Feeling bad or failure about yourself  0 1 - - -  Trouble concentrating 2 2 - - -  Moving slowly or fidgety/restless 2 2 - - -  Suicidal thoughts 1 0 - - -  PHQ-9 Score 14 12 - - -  Difficult doing work/chores Very difficult Somewhat difficult - - -  Some recent data might be hidden    Pt reports he still has the mass in his left breast. States he has not had imaging as Dr. Joelyn Oms felt the mass was related to the spironolactone he was taking. He is no longer taking the spironolactone but the mass is still present. Pt states at times the area is tender. The mass is only located in the left breast. No other associated symptoms.  He does have prostate cancer and recent testing was concerning for return of cancer and he is to follow up within the next 2 weeks.   Relevant past medical, surgical, family, and social history reviewed and updated as indicated.  Allergies and medications reviewed and updated. Date reviewed: Chart in Epic.   Past Medical History:  Diagnosis Date  . Chronic cough   . Deviated nasal septum   . 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 Family history of prostate cancer   . GERD (gastroesophageal reflux disease)   . History of adenomatous polyp of colon    2008; 2011  . History of exercise stress test    01-14-2009 by dr hochrein-- normal w/ no ischemia  . Hot flashes    DUE TO HORMONE THERAPY FOR PROSTATE CANCER  . Hyperlipidemia   . Hypertension   . Mild memory disturbance    per dr Jannifer Franklin note (neurologist)  . Nocturia  more than twice per night   . Nocturnal leg cramps 03/23/2017  . OA (osteoarthritis)   . OSA (obstructive sleep apnea)    moderate osa per study 02-12-2017  cpap recommended--- per pt never heard back from doctor for obtaining cpap  . Pneumonia 02/2018  . Prostate cancer [facility]) urologist-  dr ottelin/  oncologist-  dr Tammi Klippel   dx 10-10-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, Gleason 3+4, PSA 9.02, vol 78cc started ADT to decrease size;  08/ 2018 prostate decrease size vol 46cc and PSA 0.36--- scheduled for radiative prostate seed implants    Past Surgical History:  Procedure Laterality Date  . CATARACT EXTRACTION W/ INTRAOCULAR LENS  IMPLANT, BILATERAL  2016  . COLONOSCOPY  last one 08-08-2013  . CYSTOSCOPY N/A 08/10/2017   Procedure: CYSTOSCOPY FLEXIBLE;  Surgeon: Kathie Rhodes, MD;  Location: [facility];  Service: Urology;  Laterality: N/A;  NO SEEDS FOUND IN BLADDER  . HAND SURGERY Left 1992  . KNEE ARTHROSCOPY Bilateral 1960's and 70's  . PROSTATE BIOPSY  10-10-2016   dr Karsten Ro office  . RADIOACTIVE SEED IMPLANT N/A 08/10/2017   Procedure: RADIOACTIVE SEED IMPLANT/BRACHYTHERAPY IMPLANT;  Surgeon: Kathie Rhodes, MD;  Location: [facility];  Service: Urology;  Laterality: N/A;    64   SEEDS IMPLANTED  . ROTATOR CUFF REPAIR Right 2004  . SPACE OAR INSTILLATION N/A 08/10/2017   Procedure: SPACE OAR INSTILLATION;  Surgeon: Kathie Rhodes, MD;  Location: [facility];  Service: Urology;  Laterality: N/A;  . TOE SURGERY Left 1970   little toe  . TOTAL KNEE ARTHROPLASTY Bilateral 01-25-2009   dr Wynelle Link [facility]    Social History   Socioeconomic History  . Marital status: Married    Spouse name: [name]  . Number of children: 3  . Years of education: BA  . Highest education level: Not on file  Occupational History  . Occupation: Retired  Scientific laboratory technician  . Financial resource strain: Not on file  . Food insecurity    Worry: Not on file    Inability: Not on file  .  Transportation needs    Medical: Not on file    Non-medical: Not on file  Tobacco Use  . Smoking status: Never Smoker  . Smokeless tobacco: Never Used  Substance and Sexual Activity  . Alcohol use: Yes    Comment: rare  . Drug use: No  . Sexual activity: Yes    Partners: Female    Comment: vasectomy  Lifestyle  . Physical activity    Days per week: Not on file    Minutes per session: Not on file  . Stress: Not on file  Relationships  . Social Herbalist on phone: Not on file    Gets together: Not on file    Attends religious service: Not on file  Active member of club or organization: Not on file    Attends meetings of clubs or organizations: Not on file    Relationship status: Not on file  . Intimate partner violence    Fear of current or ex partner: Not on file    Emotionally abused: Not on file    Physically abused: Not on file    Forced sexual activity: Not on file  Other Topics Concern  . Not on file  Social History Narrative   Lives at home w/ his wife, patient has left his wife and is now staying with his son [name] as of 03/19/2019   Right-handed   Caffeine: none    Outpatient Encounter Medications as of 08/20/2019  Medication Sig  . Cholecalciferol (VITAMIN D PO) Take 1 capsule by mouth daily.   Marland Kitchen eplerenone (INSPRA) 25 MG tablet Take 25 mg by mouth 2 (two) times daily.  Marland Kitchen FIBER FORMULA PO Take 3 capsules by mouth 2 (two) times daily.  . memantine (NAMENDA) 10 MG tablet Take 1 tablet (10 mg total) by mouth 2 (two) times daily.  Marland Kitchen tolterodine (DETROL LA) 4 MG 24 hr capsule Take 4 mg by mouth at bedtime.   . [DISCONTINUED] FLUoxetine (PROZAC) 20 MG tablet Take 1 tablet (20 mg total) by mouth daily.  . busPIRone (BUSPAR) 5 MG tablet Take 1 tablet (5 mg total) by mouth 3 (three) times daily as needed (Anxiety).  Marland Kitchen FLUoxetine (PROZAC) 40 MG capsule Take 1 capsule (40 mg total) by mouth daily.  . [DISCONTINUED] spironolactone (ALDACTONE) 50 MG tablet Take  1 tablet (50 mg total) by mouth daily.   No facility-administered encounter medications on file as of 08/20/2019.     No Known Allergies  Review of Systems  Constitutional: Negative for activity change, appetite change, chills, diaphoresis, fatigue, fever, irritability and unexpected weight change.  HENT: Negative.   Eyes: Negative.   Respiratory: Negative for cough, chest tightness and shortness of breath.   Cardiovascular: Negative for chest pain, palpitations and leg swelling.  Gastrointestinal: Negative for abdominal pain, blood in stool, constipation, diarrhea, nausea and vomiting.  Endocrine: Negative.   Genitourinary: Negative for decreased urine volume, dysuria, frequency, impotence and urgency.  Musculoskeletal: Positive for gait problem (slow). Negative for arthralgias and myalgias.  Skin: Negative.        Mass in left breast  Allergic/Immunologic: Negative.   Neurological: Negative for dizziness, weakness and headaches.  Hematological: Negative.   Psychiatric/Behavioral: Positive for agitation, confusion (pt has dementia), decreased concentration, dysphoric mood and sleep disturbance. Negative for behavioral problems, hallucinations, self-injury and suicidal ideas. The patient is nervous/anxious and has insomnia. The patient is not hyperactive.   All other systems reviewed and are negative.       Objective:  BP (!) 162/102   Pulse 73   Temp 99 F (37.2 C)   Resp 20   Ht 5\' 2"  (1.575 m)   Wt 194 lb (88 kg)   SpO2 97%   BMI 35.48 kg/m    Wt Readings from Last 3 Encounters:  08/20/19 194 lb (88 kg)  08/05/19 202 lb (91.6 kg)  07/30/19 198 lb (89.8 kg)    Physical Exam Vitals signs and nursing note reviewed.  Constitutional:      General: He is not in acute distress.    Appearance: Normal appearance. He is well-developed and well-groomed. He is not ill-appearing, toxic-appearing or diaphoretic.  HENT:     Head: Normocephalic and atraumatic.  Jaw: There  is normal jaw occlusion.     Right Ear: Hearing normal.     Left Ear: Hearing normal.     Nose: Nose normal.     Mouth/Throat:     Lips: Pink.     Mouth: Mucous membranes are moist.     Pharynx: Oropharynx is clear. Uvula midline.  Eyes:     General: Lids are normal.     Extraocular Movements: Extraocular movements intact.     Conjunctiva/sclera: Conjunctivae normal.     Pupils: Pupils are equal, round, and reactive to light.  Neck:     Musculoskeletal: Normal range of motion and neck supple.     Thyroid: No thyroid mass, thyromegaly or thyroid tenderness.     Vascular: No carotid bruit or JVD.     Trachea: Trachea and phonation normal.  Cardiovascular:     Rate and Rhythm: Normal rate and regular rhythm.     Chest Wall: PMI is not displaced.     Pulses: Normal pulses.     Heart sounds: Normal heart sounds. No murmur. No friction rub. No gallop.   Pulmonary:     Effort: Pulmonary effort is normal. No respiratory distress.     Breath sounds: Normal breath sounds. No wheezing.  Chest:     Breasts:        Right: Normal.        Left: Inverted nipple, mass and tenderness present. No swelling, bleeding, nipple discharge or skin change.    Abdominal:     General: Bowel sounds are normal. There is no distension or abdominal bruit.     Palpations: Abdomen is soft. There is no hepatomegaly, splenomegaly or mass.     Tenderness: There is no abdominal tenderness. There is no right CVA tenderness, left CVA tenderness, guarding or rebound.     Hernia: No hernia is present.  Musculoskeletal: Normal range of motion.     Right lower leg: No edema.     Left lower leg: No edema.  Lymphadenopathy:     Cervical: No cervical adenopathy.     Upper Body:     Right upper body: No supraclavicular, axillary or pectoral adenopathy.     Left upper body: No supraclavicular, axillary or pectoral adenopathy.  Skin:    General: Skin is warm and dry.     Capillary Refill: Capillary refill takes less  than 2 seconds.     Coloration: Skin is not cyanotic, jaundiced or pale.     Findings: No rash.  Neurological:     General: No focal deficit present.     Mental Status: He is alert. Mental status is at baseline.     Cranial Nerves: Cranial nerves are intact.     Sensory: Sensation is intact.     Motor: Motor function is intact.     Coordination: Coordination is intact.     Gait: Gait abnormal (slow).     Deep Tendon Reflexes: Reflexes are normal and symmetric.  Psychiatric:        Attention and Perception: Attention and perception normal.        Mood and Affect: Affect normal. Mood is anxious.        Speech: Speech normal.        Behavior: Behavior normal. Behavior is cooperative.        Thought Content: Thought content normal.        Cognition and Memory: Cognition is impaired. Memory is impaired.  Judgment: Judgment normal.     Results for orders placed or performed in visit on 08/05/19  Comprehensive metabolic panel  Result Value Ref Range   Glucose 80 65 - 99 mg/dL   BUN 21 8 - 27 mg/dL   Creatinine, Ser 1.28 (H) 0.76 - 1.27 mg/dL   GFR calc non Af Amer 53 (L) >59 mL/min/1.73   GFR calc Af Amer 62 >59 mL/min/1.73   BUN/Creatinine Ratio 16 10 - 24   Sodium 141 134 - 144 mmol/L   Potassium 3.8 3.5 - 5.2 mmol/L   Chloride 104 96 - 106 mmol/L   CO2 23 20 - 29 mmol/L   Calcium 10.0 8.6 - 10.2 mg/dL   Total Protein 6.2 6.0 - 8.5 g/dL   Albumin 4.3 3.7 - 4.7 g/dL   Globulin, Total 1.9 1.5 - 4.5 g/dL   Albumin/Globulin Ratio 2.3 (H) 1.2 - 2.2   Bilirubin Total 0.5 0.0 - 1.2 mg/dL   Alkaline Phosphatase 76 39 - 117 IU/L   AST 21 0 - 40 IU/L   ALT 17 0 - 44 IU/L  CBC with Differential/Platelet  Result Value Ref Range   WBC 5.3 3.4 - 10.8 x10E3/uL   RBC 4.79 4.14 - 5.80 x10E6/uL   Hemoglobin 13.8 13.0 - 17.7 g/dL   Hematocrit 42.5 37.5 - 51.0 %   MCV 89 79 - 97 fL   MCH 28.8 26.6 - 33.0 pg   MCHC 32.5 31.5 - 35.7 g/dL   RDW 12.0 11.6 - 15.4 %   Platelets 208 150  - 450 x10E3/uL   Neutrophils 62 Not Estab. %   Lymphs 25 Not Estab. %   Monocytes 12 Not Estab. %   Eos 1 Not Estab. %   Basos 0 Not Estab. %   Neutrophils Absolute 3.2 1.4 - 7.0 x10E3/uL   Lymphocytes Absolute 1.3 0.7 - 3.1 x10E3/uL   Monocytes Absolute 0.7 0.1 - 0.9 x10E3/uL   EOS (ABSOLUTE) 0.1 0.0 - 0.4 x10E3/uL   Basophils Absolute 0.0 0.0 - 0.2 x10E3/uL   Immature Granulocytes 0 Not Estab. %   Immature Grans (Abs) 0.0 0.0 - 0.1 x10E3/uL       Pertinent labs & imaging results that were available during my care of the patient were reviewed by me and considered in my medical decision making.  Assessment & Plan:  [name] was seen today for anxiety.  Diagnoses and all orders for this visit:  Situational anxiety Situational depression -     FLUoxetine (PROZAC) 40 MG capsule; Take 1 capsule (40 mg total) by mouth daily. -     busPIRone (BUSPAR) 5 MG tablet; Take 1 tablet (5 mg total) by mouth 3 (three) times daily as needed (Anxiety).  Malignant neoplasm prostate ([place]) Followed by urology. Recent imaging concerning for return of malignant lesions, awaiting further testing and results.   Left breast mass Pt is scheduled for an [place] and mammogram of left breast. This could be related to spironolactone, but it is only present in left breast and is not symmetrical as gynecomastia usually is. Family aware of appointment for imaging.     Continue all other maintenance medications.  Follow up plan: Return in about 3 months (around 11/20/2019), or if symptoms worsen or fail to improve, for GAD.  Continue healthy lifestyle choices, including diet (rich in fruits, vegetables, and lean proteins, and low in salt and simple carbohydrates) and exercise (at least 30 minutes of moderate physical activity daily).  Educational handout given for  depression, crisis hotline numbers  The above assessment and management plan was discussed with the patient. The patient verbalized understanding of and  has agreed to the management plan. Patient is aware to call the clinic if they develop any new symptoms or if symptoms persist or worsen. Patient is aware when to return to the clinic for a follow-up visit. Patient educated on when it is appropriate to go to the emergency department.   Monia Pouch, FNP-C [facility] [phone]

## 2019-08-20 NOTE — Patient Instructions (Signed)
If your symptoms worsen or you have thoughts of suicide/homicide, PLEASE SEEK IMMEDIATE MEDICAL ATTENTION.  You may always call the National Suicide Hotline.  This is available 24 hours a day, 7 days a week.  Their number is: 1-800-273-8255  Taking the medicine as directed and not missing any doses is one of the best things you can do to treat your depression.  Here are some things to keep in mind:  1) Side effects (stomach upset, some increased anxiety) may happen before you notice a benefit.  These side effects typically go away over time. 2) Changes to your dose of medicine or a change in medication all together is sometimes necessary 3) Most people need to be on medication at least 12 months 4) Many people will notice an improvement within two weeks but the full effect of the medication can take up to 4-6 weeks 5) Stopping the medication when you start feeling better often results in a return of symptoms 6) Never discontinue your medication without contacting a health care professional first.  Some medications require gradual discontinuation/ taper and can make you sick if you stop them abruptly.  If your symptoms worsen or you have thoughts of suicide/homicide, PLEASE SEEK IMMEDIATE MEDICAL ATTENTION.  You may always call:  National Suicide Hotline: 800-273-8255 [place] Crisis Line: 336-832-9700 Crisis Recovery in [place]: 800-939-5911   These are available 24 hours a day, 7 days a week.   

## 2019-09-01 ENCOUNTER — Other Ambulatory Visit: Payer: Self-pay

## 2019-09-01 DIAGNOSIS — N6320 Unspecified lump in the left breast, unspecified quadrant: Secondary | ICD-10-CM

## 2019-09-06 ENCOUNTER — Other Ambulatory Visit: Payer: Self-pay | Admitting: Adult Health

## 2019-09-11 ENCOUNTER — Other Ambulatory Visit: Payer: Self-pay | Admitting: Family Medicine

## 2019-09-11 DIAGNOSIS — F418 Other specified anxiety disorders: Secondary | ICD-10-CM

## 2019-09-11 DIAGNOSIS — C61 Malignant neoplasm of prostate: Secondary | ICD-10-CM | POA: Diagnosis not present

## 2019-09-11 NOTE — Telephone Encounter (Signed)
Pharmacy comment: REQUEST FOR 90 DAYS PRESCRIPTION.

## 2019-09-19 ENCOUNTER — Encounter: Payer: Self-pay | Admitting: *Deleted

## 2019-10-06 ENCOUNTER — Telehealth: Payer: Self-pay | Admitting: Family Medicine

## 2019-10-06 NOTE — Telephone Encounter (Signed)
Ricky [name],   Can you help with this? If I need to place an order, please let me know. Thanks.   [name]

## 2019-10-06 NOTE — Telephone Encounter (Signed)
No need for referral. I'll be glad to gather some resources and give them a call.   Ricky [name]

## 2019-10-06 NOTE — Telephone Encounter (Signed)
Patient son is wanting some one to sit with his dad like an Aid wants to check on hours and prices. Stated they have talked about this with you. Please advise

## 2019-10-06 NOTE — Telephone Encounter (Signed)
Thanks

## 2019-10-07 ENCOUNTER — Other Ambulatory Visit: Payer: Self-pay

## 2019-10-07 ENCOUNTER — Ambulatory Visit: Payer: Medicare Other | Admitting: *Deleted

## 2019-10-07 DIAGNOSIS — C61 Malignant neoplasm of prostate: Secondary | ICD-10-CM

## 2019-10-07 DIAGNOSIS — I10 Essential (primary) hypertension: Secondary | ICD-10-CM

## 2019-10-07 NOTE — Chronic Care Management (AMB) (Cosign Needed)
   Care Management   Note  10/07/2019 Name: Ricky [name] MRN: [id number] DOB: 28-Jan-1941  I was asked to assist Ricky [name] and his family in finding an in-home sitter/careviger. He is not a current CCM client and information was not provided today as their focus is on obtaining in-home care.   Provided Ricky contact information for [facility] [phone].  Follow up plan: Ricky care management team will reach out Ricky Ricky [name] over Ricky next 30 days to assess needs and to discuss CCM services if appropriate at that time.   [name], BSN, RN-BC Embedded Chronic Care Manager [facility] [place] Management Direct Dial: [phone]

## 2019-10-07 NOTE — Patient Instructions (Signed)
[facility]ailing and Shipping Address: [address] [place], [place] [address] Phone: [phone] Email: [email or web address]   Follow up plan: The care management team will reach out the patient/family over the next 30 days to assess needs and to discuss CCM services if appropriate at that time.   [name], BSN, RN-BC Embedded Chronic Care Manager [facility] [place] [facility] Direct Dial: [phone]

## 2019-10-08 ENCOUNTER — Ambulatory Visit (INDEPENDENT_AMBULATORY_CARE_PROVIDER_SITE_OTHER): Payer: Medicare Other | Admitting: Family Medicine

## 2019-10-08 ENCOUNTER — Telehealth: Payer: Self-pay

## 2019-10-08 ENCOUNTER — Encounter: Payer: Self-pay | Admitting: Family Medicine

## 2019-10-08 DIAGNOSIS — I10 Essential (primary) hypertension: Secondary | ICD-10-CM

## 2019-10-08 DIAGNOSIS — E782 Mixed hyperlipidemia: Secondary | ICD-10-CM | POA: Diagnosis not present

## 2019-10-08 DIAGNOSIS — C61 Malignant neoplasm of prostate: Secondary | ICD-10-CM

## 2019-10-08 DIAGNOSIS — R413 Other amnesia: Secondary | ICD-10-CM | POA: Diagnosis not present

## 2019-10-08 NOTE — Progress Notes (Signed)
Virtual Visit via telephone Note Due to COVID-19 pandemic this visit was conducted virtually. This visit type was conducted due to national recommendations for restrictions regarding the COVID-19 Pandemic (e.g. social distancing, sheltering in place) in an effort to limit this patient's exposure and mitigate transmission in our community. All issues noted in this document were discussed and addressed.  A physical exam was not performed with this format.   I connected with Ricky [name] son, Ricky [name] on 10/08/2019 at 1230 by telephone and verified that I am speaking with the correct person using two identifiers. Ricky [name] is currently located at home and family is currently with them during visit. The provider, Monia Pouch, FNP is located in their office at time of visit.  I discussed the limitations, risks, security and privacy concerns of performing an evaluation and management service by telephone and the availability of in person appointments. I also discussed with the patient that there may be a patient responsible charge related to this service. The patient expressed understanding and agreed to proceed.  Subjective:  Patient ID: Ricky [name], male    DOB: [birth year], 78 y.o.   MRN: [id number]  Chief Complaint:  Medical Management of Chronic Issues   HPI: Ricky [name] is a 78 y.o. male presenting on 10/08/2019 for Medical Management of Chronic Issues  1. Essential hypertension Compliant with medications and tolerating well. No chest pain, leg swelling, or dizziness.  2. Memory difficulty Ongoing and worsening. Son reports he is having a hard time with paranoia and being alone. States he has to have someone with him at all times. He is looking into having someone come in to help with his care. He does have an upcoming appointment with neurology.   3. Mixed hyperlipidemia Diet controlled. Takes fiber daily. Does try to eat healthy. No regular exercise.   4. Malignant neoplasm prostate  ([facility]) Followed by urology on a regular basis. Recent imaging suggestive of return of malignancy.    Relevant past medical, surgical, family, and social history reviewed and updated as indicated.  Allergies and medications reviewed and updated.   Past Medical History:  Diagnosis Date  . Chronic cough   . Deviated nasal septum   . 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 Family history of prostate cancer   . GERD (gastroesophageal reflux disease)   . History of adenomatous polyp of colon    2008; 2011  . History of exercise stress test    01-14-2009 by dr hochrein-- normal w/ no ischemia  . Hot flashes    DUE TO HORMONE THERAPY FOR PROSTATE CANCER  . Hyperlipidemia   . Hypertension   . Mild memory disturbance    per dr Jannifer Franklin note (neurologist)  . Nocturia more than twice per night   . Nocturnal leg cramps 03/23/2017  . OA (osteoarthritis)   . OSA (obstructive sleep apnea)    moderate osa per study 02-12-2017  cpap recommended--- per pt never heard back from doctor for obtaining cpap  . Pneumonia 02/2018  . Prostate cancer [facility]) urologist-  dr ottelin/  oncologist-  dr Tammi Klippel   dx 10-10-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, Gleason 3+4, PSA 9.02, vol 78cc started ADT to decrease size;  08/ 2018 prostate decrease size vol 46cc and PSA 0.36--- scheduled for radiative prostate seed implants    Past Surgical History:  Procedure Laterality Date  . CATARACT EXTRACTION W/ INTRAOCULAR LENS  IMPLANT, BILATERAL  2016  . COLONOSCOPY  last one 08-08-2013  .  CYSTOSCOPY N/A 08/10/2017   Procedure: CYSTOSCOPY FLEXIBLE;  Surgeon: Kathie Rhodes, MD;  Location: [facility];  Service: Urology;  Laterality: N/A;  NO SEEDS FOUND IN BLADDER  . HAND SURGERY Left 1992  . KNEE ARTHROSCOPY Bilateral 1960's and 70's  . PROSTATE BIOPSY  10-10-2016   dr Karsten Ro office  . RADIOACTIVE SEED IMPLANT N/A 08/10/2017   Procedure: RADIOACTIVE SEED IMPLANT/BRACHYTHERAPY IMPLANT;  Surgeon: Kathie Rhodes, MD;  Location: [facility];  Service: Urology;  Laterality: N/A;    64   SEEDS IMPLANTED  . ROTATOR CUFF REPAIR Right 2004  . SPACE OAR INSTILLATION N/A 08/10/2017   Procedure: SPACE OAR INSTILLATION;  Surgeon: Kathie Rhodes, MD;  Location: [facility];  Service: Urology;  Laterality: N/A;  . TOE SURGERY Left 1970   little toe  . TOTAL KNEE ARTHROPLASTY Bilateral 01-25-2009   dr Wynelle Link [facility]    Social History   Socioeconomic History  . Marital status: Married    Spouse name: [name]  . Number of children: 3  . Years of education: BA  . Highest education level: Not on file  Occupational History  . Occupation: Retired  Tobacco Use  . Smoking status: Never Smoker  . Smokeless tobacco: Never Used  Substance and Sexual Activity  . Alcohol use: Yes    Comment: rare  . Drug use: No  . Sexual activity: Yes    Partners: Female    Comment: vasectomy  Other Topics Concern  . Not on file  Social History Narrative   Lives at home w/ his wife, patient has left his wife and is now staying with his son Ricky [name] as of 03/19/2019   Right-handed   Caffeine: none   Social Determinants of Health   Financial Resource Strain:   . Difficulty of Paying Living Expenses: Not on file  Food Insecurity:   . Worried About Charity fundraiser in the Last Year: Not on file  . Ran Out of Food in the Last Year: Not on file  Transportation Needs:   . Lack of Transportation (Medical): Not on file  . Lack of Transportation (Non-Medical): Not on file  Physical Activity:   . Days of Exercise per Week: Not on file  . Minutes of Exercise per Session: Not on file  Stress:   . Feeling of Stress : Not on file  Social Connections:   . Frequency of Communication with Friends and Family: Not on file  . Frequency of Social Gatherings with Friends and Family: Not on file  . Attends Religious Services: Not on file  . Active Member of Clubs or Organizations: Not on file  . Attends English as a second language teacher Meetings: Not on file  . Marital Status: Not on file  Intimate Partner Violence:   . Fear of Current or Ex-Partner: Not on file  . Emotionally Abused: Not on file  . Physically Abused: Not on file  . Sexually Abused: Not on file    Outpatient Encounter Medications as of 10/08/2019  Medication Sig  . busPIRone (BUSPAR) 5 MG tablet Take 1 tablet (5 mg total) by mouth 3 (three) times daily as needed (Anxiety).  . Cholecalciferol (VITAMIN D PO) Take 1 capsule by mouth daily.   Marland Kitchen eplerenone (INSPRA) 25 MG tablet Take 25 mg by mouth 2 (two) times daily.  Marland Kitchen FIBER FORMULA PO Take 3 capsules by mouth 2 (two) times daily.  Marland Kitchen FLUoxetine (PROZAC) 40 MG capsule Take 1 capsule (40 mg total)  by mouth daily.  . memantine (NAMENDA) 10 MG tablet TAKE 1 TABLET BY MOUTH TWICE A DAY  . tolterodine (DETROL LA) 4 MG 24 hr capsule Take 4 mg by mouth at bedtime.    No facility-administered encounter medications on file as of 10/08/2019.    No Known Allergies  Review of Systems  Constitutional: Negative for activity change, appetite change, chills, diaphoresis, fatigue, fever and unexpected weight change.  HENT: Negative.   Eyes: Negative.  Negative for photophobia and visual disturbance.  Respiratory: Negative for cough, chest tightness and shortness of breath.   Cardiovascular: Negative for chest pain, palpitations and leg swelling.  Gastrointestinal: Negative for abdominal pain, blood in stool, constipation, diarrhea, nausea and vomiting.  Endocrine: Negative.   Genitourinary: Negative for decreased urine volume, difficulty urinating, dysuria, frequency, hematuria and urgency.  Musculoskeletal: Negative for arthralgias and myalgias.  Skin: Negative.   Allergic/Immunologic: Negative.   Neurological: Negative for dizziness, tremors, seizures, syncope, facial asymmetry, speech difficulty, weakness, light-headedness, numbness and headaches.  Hematological: Negative.    Psychiatric/Behavioral: Positive for agitation, confusion and sleep disturbance. Negative for behavioral problems, decreased concentration, dysphoric mood, hallucinations, self-injury and suicidal ideas. The patient is nervous/anxious. The patient is not hyperactive.   All other systems reviewed and are negative.        Observations/Objective: No vital signs or physical exam, this was a telephone or virtual health encounter.  Pt alert and oriented, answers all questions appropriately, and able to speak in full sentences.    Assessment and Plan: Ricky [name] was seen today for medical management of chronic issues.  Diagnoses and all orders for this visit:  Essential hypertension Continue medications as prescribed. DASH diet. Report any persistent high or low readings.   Memory difficulty Followed by neurology on a regular basis. Pt is requiring more help with care on a daily basis. Family seeking help with this and have reached out to community resources provided by [facility].   Mixed hyperlipidemia Diet and exercise encouraged. Will recheck labs at next in office visit.   Malignant neoplasm prostate [facility]) Keep follow up appointments with urology.     Follow Up Instructions: Return in about 3 months (around 01/06/2020), or if symptoms worsen or fail to improve.    I discussed the assessment and treatment plan with the patient. The patient was provided an opportunity to ask questions and all were answered. The patient agreed with the plan and demonstrated an understanding of the instructions.   The patient was advised to call back or seek an in-person evaluation if the symptoms worsen or if the condition fails to improve as anticipated.  The above assessment and management plan was discussed with the patient. The patient verbalized understanding of and has agreed to the management plan. Patient is aware to call the clinic if they develop any new symptoms or if symptoms persist or worsen.  Patient is aware when to return to the clinic for a follow-up visit. Patient educated on when it is appropriate to go to the emergency department.    I provided 25 minutes of non-face-to-face time during this encounter. The call started at 1230. The call ended at 1255. The other time was used for coordination of care.    Monia Pouch, FNP-C [facility] [address] [place],  [address] [phone] 10/08/2019

## 2019-10-08 NOTE — Telephone Encounter (Signed)
Spoke to patient's son [name] about r/s the [place] for the breast.  Per [name] they do not feel that it is needed at the moment since patient is not having any issues with the area and that is is due to medication.  Per [name] the mass has gone down some. They will follow up with the office as needed with any questions or concerns in regards to the issue

## 2019-10-11 ENCOUNTER — Other Ambulatory Visit: Payer: Self-pay | Admitting: Family Medicine

## 2019-10-11 DIAGNOSIS — F418 Other specified anxiety disorders: Secondary | ICD-10-CM

## 2019-10-16 ENCOUNTER — Ambulatory Visit (INDEPENDENT_AMBULATORY_CARE_PROVIDER_SITE_OTHER): Payer: Medicare Other | Admitting: Family

## 2019-10-16 ENCOUNTER — Ambulatory Visit (INDEPENDENT_AMBULATORY_CARE_PROVIDER_SITE_OTHER): Payer: Medicare Other

## 2019-10-16 ENCOUNTER — Other Ambulatory Visit: Payer: Self-pay

## 2019-10-16 ENCOUNTER — Encounter: Payer: Self-pay | Admitting: Family

## 2019-10-16 VITALS — BP 169/110 | HR 59 | Temp 97.8°F | Ht 62.0 in | Wt 183.0 lb

## 2019-10-16 DIAGNOSIS — R0781 Pleurodynia: Secondary | ICD-10-CM

## 2019-10-16 DIAGNOSIS — Z9181 History of falling: Secondary | ICD-10-CM | POA: Diagnosis not present

## 2019-10-16 DIAGNOSIS — W19XXXA Unspecified fall, initial encounter: Secondary | ICD-10-CM | POA: Diagnosis not present

## 2019-10-16 DIAGNOSIS — Y92009 Unspecified place in unspecified non-institutional (private) residence as the place of occurrence of the external cause: Secondary | ICD-10-CM

## 2019-10-16 DIAGNOSIS — F0390 Unspecified dementia without behavioral disturbance: Secondary | ICD-10-CM

## 2019-10-16 DIAGNOSIS — S2231XA Fracture of one rib, right side, initial encounter for closed fracture: Secondary | ICD-10-CM | POA: Diagnosis not present

## 2019-10-16 NOTE — Patient Instructions (Signed)
Rib Contusion A rib contusion is a deep bruise on your rib area. Contusions are the result of a blunt trauma that causes bleeding and injury to the tissues under the skin. A rib contusion may involve bruising of the ribs and of the skin and muscles in the area. The skin over the contusion may turn blue, purple, or yellow. Minor injuries will give you a painless contusion. More severe contusions may stay painful and swollen for a few weeks. What are the causes? This condition is usually caused by a blow, trauma, or direct force to an area of the body. This often occurs while playing contact sports. What are the signs or symptoms? Symptoms of this condition include:  Swelling and redness of the injured area.  Discoloration of the injured area.  Tenderness and soreness of the injured area.  Pain with or without movement. How is this diagnosed? This condition may be diagnosed based on:  Your symptoms and medical history.  A physical exam.  Imaging tests--such as an X-ray, CT scan, or MRI--to determine if there were internal injuries or broken bones (fractures). How is this treated? This condition may be treated with:  Rest. This is often the best treatment for a rib contusion.  Icing. This reduces swelling and inflammation.  Deep-breathing exercises. These may be recommended to reduce the risk for lung collapse and pneumonia.  Medicines. Over-the-counter or prescription medicines may be given to control pain.  Injection of a numbing medicine around the nerve near your injury (nerve block). Follow these instructions at home:     Medicines  Take over-the-counter and prescription medicines only as told by your health care provider.  Do not drive or use heavy machinery while taking prescription pain medicine.  If you are taking prescription pain medicine, take actions to prevent or treat constipation. Your health care provider may recommend that you: ? Drink enough fluid to keep  your urine pale yellow. ? Eat foods that are high in fiber, such as fresh fruits and vegetables, whole grains, and beans. ? Limit foods that are high in fat and processed sugars, such as fried or sweet foods. ? Take an over-the-counter or prescription medicine for constipation. Managing pain, stiffness, and swelling  If directed, put ice on the injured area: ? Put ice in a plastic bag. ? Place a towel between your skin and the bag. ? Leave the ice on for 20 minutes, 2-3 times a day.  Rest the injured area. Avoid strenuous activity and any activities or movements that cause pain. Be careful during activities and avoid bumping the injured area.  Do not lift anything that is heavier than 5 lb (2.3 kg), or the limit that you are told, until your health care provider says that it is safe. General instructions  Do not use any products that contain nicotine or tobacco, such as cigarettes and e-cigarettes. These can delay healing. If you need help quitting, ask your health care provider.  Do deep-breathing exercises as told by your health care provider.  If you were given an incentive spirometer, use it every 1-2 hours while you are awake, or as recommended by your health care provider. This device measures how well you are filling your lungs with each breath.  Keep all follow-up visits as told by your health care provider. This is important. Contact a health care provider if you have:  Increased bruising or swelling.  Pain that is not controlled with treatment.  A fever. Get help right away if   you:  Have difficulty breathing or shortness of breath.  Develop a continual cough or you cough up thick or bloody sputum.  Feel nauseous or you vomit.  Have pain in your abdomen. Summary  A rib contusion is a deep bruise on your rib area. Contusions are the result of a blunt trauma that causes bleeding and injury to the tissues under the skin.  The skin overlying the contusion may turn  blue, purple, or yellow. Minor injuries may give you a painless contusion. More severe contusions may stay painful and swollen for a few weeks.  Rest the injured area. Avoid strenuous activity and any activities or movements that cause pain. This information is not intended to replace advice given to you by your health care provider. Make sure you discuss any questions you have with your health care provider. Document Revised: 10/24/2017 Document Reviewed: 10/24/2017 Elsevier Patient Education  2020 [employer] Inc[employer]  

## 2019-10-16 NOTE — Progress Notes (Signed)
Subjective:    Patient ID: Ricky [name], male    DOB: 1941/03/11, 79 y.o.   MRN: [id number]  Chief Complaint  Patient presents with  . fell , injuring left ribs   PT presents to the office today with his son. He reports he fell walking to the bathroom two nights ago. He currently lives in his son's basement apartment. He has fallen 2-3 times over the last 2 months. He has dementia, but son reports he is at his baseline today. Denies any head injury.  Fall The accident occurred 2 days ago. The fall occurred while standing. He fell from a height of 1 to 2 ft. Impact surface: landed on table. There was no blood loss. Point of impact: left rib. Pain location: left rib. The pain is at a severity of 9/10. The pain is mild. Exacerbated by: coughing. Pertinent negatives include no abdominal pain, fever, headaches, hematuria, loss of consciousness or visual change. He has tried rest and NSAID for the symptoms. The treatment provided mild relief.      Review of Systems  Constitutional: Negative for fever.  Gastrointestinal: Negative for abdominal pain.  Genitourinary: Negative for hematuria.  Neurological: Negative for loss of consciousness and headaches.  All other systems reviewed and are negative.      Objective:   Physical Exam Vitals reviewed.  Constitutional:      General: He is not in acute distress.    Appearance: He is well-developed.     Comments: Confused at times   HENT:     Head: Normocephalic.     Right Ear: External ear normal.     Left Ear: External ear normal.  Eyes:     General:        Right eye: No discharge.        Left eye: No discharge.     Pupils: Pupils are equal, round, and reactive to light.  Neck:     Thyroid: No thyromegaly.  Cardiovascular:     Rate and Rhythm: Normal rate and regular rhythm.     Heart sounds: Normal heart sounds. No murmur.  Pulmonary:     Effort: Pulmonary effort is normal. No respiratory distress.     Breath sounds: Normal breath  sounds. No wheezing.  Abdominal:     General: Bowel sounds are normal. There is no distension.     Palpations: Abdomen is soft.     Tenderness: There is no abdominal tenderness.  Musculoskeletal:        General: No tenderness. Normal range of motion.       Arms:     Cervical back: Normal range of motion and neck supple.     Comments: Tenderness, mild swelling and ecchymosis present left rib  Skin:    General: Skin is warm and dry.     Findings: Bruising present. No erythema or rash.  Neurological:     Cranial Nerves: No cranial nerve deficit.     Motor: Weakness present.     Gait: Gait abnormal.     Deep Tendon Reflexes: Reflexes are normal and symmetric.     Comments: Shuffles feet when walking, using cane. Unsteady gait.   Psychiatric:        Behavior: Behavior normal.        Thought Content: Thought content normal.        Judgment: Judgment normal.     BP (!) 169/110   Pulse (!) 59   Temp 97.8 F (36.6 C) (Temporal)  Ht '5\' 2"'  (1.575 m)   Wt 183 lb (83 kg)   SpO2 97%   BMI 33.47 kg/m        Assessment & Plan:  Ricky [name] comes in today with chief complaint of fell , injuring left ribs   Diagnosis and orders addressed:  1. Rib pain on left side - DG Ribs Unilateral W/Chest Left; Future - CBC with Differential/Platelet - BMP8+EGFR  2. Fall in home, initial encounter - CBC with Differential/Platelet - BMP8+EGFR  3. Dementia without behavioral disturbance, unspecified dementia type ([facility]) - CBC with Differential/Platelet - BMP8+EGFR  4. At high risk for injury related to fall   Given recent falls we will check labs to rule out any electrolyte imbalance or infection. According to son he is at his baseline. He has recently added Buspar and Prozac to his medications. Son reports that this has helped with his mood. No s/s of infection present. No head injury present. If you have any changes in memory, gait, speech go to ED. I have given patient a urinal.  Discussed that he is to use this every night and avoid going to bathroom at night if possible. Remove all rugs and loose items on floor. Install night lights and keep area well lit. Keep follow up with PCP. Encouraged deep breathing and coughing.   Evelina Dun, FNP

## 2019-10-17 LAB — CBC WITH DIFFERENTIAL/PLATELET
Basophils Absolute: 0 10*3/uL (ref 0.0–0.2)
Basos: 0 %
EOS (ABSOLUTE): 0.1 10*3/uL (ref 0.0–0.4)
Eos: 2 %
Hematocrit: 41.6 % (ref 37.5–51.0)
Hemoglobin: 14 g/dL (ref 13.0–17.7)
Immature Grans (Abs): 0 10*3/uL (ref 0.0–0.1)
Immature Granulocytes: 1 %
Lymphocytes Absolute: 0.9 10*3/uL (ref 0.7–3.1)
Lymphs: 17 %
MCH: 28.3 pg (ref 26.6–33.0)
MCHC: 33.7 g/dL (ref 31.5–35.7)
MCV: 84 fL (ref 79–97)
Monocytes Absolute: 0.6 10*3/uL (ref 0.1–0.9)
Monocytes: 11 %
Neutrophils Absolute: 4 10*3/uL (ref 1.4–7.0)
Neutrophils: 69 %
Platelets: 204 10*3/uL (ref 150–450)
RBC: 4.94 x10E6/uL (ref 4.14–5.80)
RDW: 13.3 % (ref 11.6–15.4)
WBC: 5.7 10*3/uL (ref 3.4–10.8)

## 2019-10-17 LAB — BMP8+EGFR
BUN/Creatinine Ratio: 11 (ref 10–24)
BUN: 13 mg/dL (ref 8–27)
CO2: 26 mmol/L (ref 20–29)
Calcium: 9.6 mg/dL (ref 8.6–10.2)
Chloride: 103 mmol/L (ref 96–106)
Creatinine, Ser: 1.16 mg/dL (ref 0.76–1.27)
GFR calc Af Amer: 69 mL/min/{1.73_m2} (ref >59)
GFR calc non Af Amer: 60 mL/min/{1.73_m2} (ref >59)
Glucose: 87 mg/dL (ref 65–99)
Potassium: 2.9 mmol/L — ABNORMAL LOW (ref 3.5–5.2)
Sodium: 146 mmol/L — ABNORMAL HIGH (ref 134–144)

## 2019-10-20 ENCOUNTER — Other Ambulatory Visit: Payer: Self-pay | Admitting: Family

## 2019-10-20 MED ORDER — POTASSIUM CHLORIDE ER 20 MEQ PO TBCR: 20.0000 meq | EXTENDED_RELEASE_TABLET | Freq: Two times a day (BID) | ORAL | 0 refills | Status: DC

## 2019-11-02 ENCOUNTER — Other Ambulatory Visit: Payer: Self-pay | Admitting: Family

## 2019-11-04 ENCOUNTER — Telehealth: Payer: Medicare Other

## 2019-11-08 ENCOUNTER — Ambulatory Visit: Payer: Medicare Other

## 2019-11-12 ENCOUNTER — Other Ambulatory Visit: Payer: Self-pay

## 2019-11-13 ENCOUNTER — Ambulatory Visit (INDEPENDENT_AMBULATORY_CARE_PROVIDER_SITE_OTHER): Payer: Medicare Other | Admitting: Family Medicine

## 2019-11-13 ENCOUNTER — Encounter: Payer: Self-pay | Admitting: Family Medicine

## 2019-11-13 VITALS — BP 131/75 | HR 56 | Temp 97.3°F | Resp 18 | Ht 68.0 in | Wt 182.4 lb

## 2019-11-13 DIAGNOSIS — F4321 Adjustment disorder with depressed mood: Secondary | ICD-10-CM

## 2019-11-13 DIAGNOSIS — R413 Other amnesia: Secondary | ICD-10-CM | POA: Diagnosis not present

## 2019-11-13 DIAGNOSIS — N1832 Chronic kidney disease, stage 3b: Secondary | ICD-10-CM | POA: Diagnosis not present

## 2019-11-13 DIAGNOSIS — R42 Dizziness and giddiness: Secondary | ICD-10-CM | POA: Diagnosis not present

## 2019-11-13 DIAGNOSIS — F513 Sleepwalking [somnambulism]: Secondary | ICD-10-CM

## 2019-11-13 DIAGNOSIS — I951 Orthostatic hypotension: Secondary | ICD-10-CM | POA: Diagnosis not present

## 2019-11-13 DIAGNOSIS — F418 Other specified anxiety disorders: Secondary | ICD-10-CM

## 2019-11-13 DIAGNOSIS — I129 Hypertensive chronic kidney disease with stage 1 through stage 4 chronic kidney disease, or unspecified chronic kidney disease: Secondary | ICD-10-CM | POA: Diagnosis not present

## 2019-11-13 DIAGNOSIS — E876 Hypokalemia: Secondary | ICD-10-CM | POA: Diagnosis not present

## 2019-11-13 NOTE — Progress Notes (Signed)
Subjective:  Patient ID: Ricky [name], male    DOB: 19-Jun-1941, 79 y.o.   MRN: [id number]  Patient Care Team: Baruch Gouty, FNP as PCP - General (Family Medicine) Celene Squibb, MD as Consulting Physician (Dermatology) Tanda Rockers, MD as Consulting Physician (Pulmonary Disease) Kathie Rhodes, MD as Consulting Physician (Urology) Rutherford Guys, MD as Consulting Physician (Ophthalmology) Rexene Agent, MD as Attending Physician (Nephrology)   Chief Complaint:  Face to face (needs in home care at night)   HPI: Ricky [name] is a 79 y.o. male presenting on 11/13/2019 for Face to face (needs in home care at night)   Pt presenting today for a face to face evaluation to initiate in home assistance. Pt has memory difficulty, situational anxiety and depression, vertigo, and nocturnal wandering. Pt has been living in an apartment in his son's basement and is ready to transition back into his own home. The pt and his son feel this will be better for his anxiety and paranoia. The pt does get up at night and wander about. He has left the basement apartment and went around to the front door of the house in the middle of the night to get his son. He does not handle any of his finances or drive due to his confusion. He has to help help with certain putting on his socks and shoes. He does not cook, but is able to feed self. He can take a bath on his own. Son is concerned if he goes back to his own home he will wander and night or possibly get injured. Pt does become very anxious and paranoid when alone.     Relevant past medical, surgical, family, and social history reviewed and updated as indicated.  Allergies and medications reviewed and updated. Date reviewed: Chart in Epic.   Past Medical History:  Diagnosis Date  . Chronic cough   . Deviated nasal septum   . 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 Family history of prostate cancer   . GERD (gastroesophageal reflux  disease)   . History of adenomatous polyp of colon    2008; 2011  . History of exercise stress test    01-14-2009 by dr hochrein-- normal w/ no ischemia  . Hot flashes    DUE TO HORMONE THERAPY FOR PROSTATE CANCER  . Hyperlipidemia   . Hypertension   . Mild memory disturbance    per dr Ricky [name] note (neurologist)  . Nocturia more than twice per night   . Nocturnal leg cramps 03/23/2017  . OA (osteoarthritis)   . OSA (obstructive sleep apnea)    moderate osa per study 02-12-2017  cpap recommended--- per pt never heard back from doctor for obtaining cpap  . Pneumonia 02/2018  . Prostate cancer [facility]) urologist-  dr ottelin/  oncologist-  dr Tammi Klippel   dx 10-10-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, Gleason 3+4, PSA 9.02, vol 78cc started ADT to decrease size;  08/ 2018 prostate decrease size vol 46cc and PSA 0.36--- scheduled for radiative prostate seed implants    Past Surgical History:  Procedure Laterality Date  . CATARACT EXTRACTION W/ INTRAOCULAR LENS  IMPLANT, BILATERAL  2016  . COLONOSCOPY  last one 08-08-2013  . CYSTOSCOPY N/A 08/10/2017   Procedure: CYSTOSCOPY FLEXIBLE;  Surgeon: Kathie Rhodes, MD;  Location: [facility];  Service: Urology;  Laterality: N/A;  NO SEEDS FOUND IN BLADDER  . HAND SURGERY Left 1992  . KNEE ARTHROSCOPY Bilateral 1960's  and 70's  . PROSTATE BIOPSY  10-10-2016   dr Karsten Ro office  . RADIOACTIVE SEED IMPLANT N/A 08/10/2017   Procedure: RADIOACTIVE SEED IMPLANT/BRACHYTHERAPY IMPLANT;  Surgeon: Kathie Rhodes, MD;  Location: [facility];  Service: Urology;  Laterality: N/A;    64   SEEDS IMPLANTED  . ROTATOR CUFF REPAIR Right 2004  . SPACE OAR INSTILLATION N/A 08/10/2017   Procedure: SPACE OAR INSTILLATION;  Surgeon: Kathie Rhodes, MD;  Location: [facility]  Service: Urology;  Laterality: N/A;  . TOE SURGERY Left 1970   little toe  . TOTAL KNEE ARTHROPLASTY Bilateral 01-25-2009   dr Wynelle Link [facility]    Social History   Socioeconomic  History  . Marital status: Married    Spouse name: Ricky [name]  . Number of children: 3  . Years of education: BA  . Highest education level: Not on file  Occupational History  . Occupation: Retired  Tobacco Use  . Smoking status: Never Smoker  . Smokeless tobacco: Never Used  Substance and Sexual Activity  . Alcohol use: Yes    Comment: rare  . Drug use: No  . Sexual activity: Yes    Partners: Female    Comment: vasectomy  Other Topics Concern  . Not on file  Social History Narrative   Lives at home w/ his wife, patient has left his wife and is now staying with his son Ricky [name] as of 03/19/2019   Right-handed   Caffeine: none   Social Determinants of Health   Financial Resource Strain:   . Difficulty of Paying Living Expenses: Not on file  Food Insecurity:   . Worried About Charity fundraiser in the Last Year: Not on file  . Ran Out of Food in the Last Year: Not on file  Transportation Needs:   . Lack of Transportation (Medical): Not on file  . Lack of Transportation (Non-Medical): Not on file  Physical Activity:   . Days of Exercise per Week: Not on file  . Minutes of Exercise per Session: Not on file  Stress:   . Feeling of Stress : Not on file  Social Connections:   . Frequency of Communication with Friends and Family: Not on file  . Frequency of Social Gatherings with Friends and Family: Not on file  . Attends Religious Services: Not on file  . Active Member of Clubs or Organizations: Not on file  . Attends Archivist Meetings: Not on file  . Marital Status: Not on file  Intimate Partner Violence:   . Fear of Current or Ex-Partner: Not on file  . Emotionally Abused: Not on file  . Physically Abused: Not on file  . Sexually Abused: Not on file    Outpatient Encounter Medications as of 11/13/2019  Medication Sig  . busPIRone (BUSPAR) 5 MG tablet   . Cholecalciferol (VITAMIN D PO) Take 1 capsule by mouth daily.   Marland Kitchen eplerenone (INSPRA) 25 MG tablet Take 25 mg  by mouth 2 (two) times daily.  Marland Kitchen FIBER FORMULA PO Take 3 capsules by mouth 2 (two) times daily.  Marland Kitchen FLUoxetine (PROZAC) 40 MG capsule Take 1 capsule (40 mg total) by mouth daily.  . memantine (NAMENDA) 10 MG tablet TAKE 1 TABLET BY MOUTH TWICE A DAY  . Potassium Chloride ER 20 MEQ TBCR TAKE 1 TABLET (20MEQ) BY MOUTH TWICE DAILY  . tolterodine (DETROL LA) 4 MG 24 hr capsule Take 4 mg by mouth at bedtime.    No facility-administered encounter  medications on file as of 11/13/2019.    No Known Allergies  Review of Systems  Constitutional: Negative for activity change, appetite change, chills, diaphoresis, fatigue, fever and unexpected weight change.  HENT: Negative.   Eyes: Negative.  Negative for photophobia and visual disturbance.  Respiratory: Negative for cough, chest tightness and shortness of breath.   Cardiovascular: Negative for chest pain, palpitations and leg swelling.  Gastrointestinal: Negative for abdominal pain, blood in stool, constipation, diarrhea, nausea and vomiting.  Endocrine: Negative.  Negative for polydipsia, polyphagia and polyuria.  Genitourinary: Negative for decreased urine volume, difficulty urinating, dysuria, frequency and urgency.  Musculoskeletal: Positive for gait problem (slow, unsteady). Negative for arthralgias and myalgias.  Skin: Negative.   Allergic/Immunologic: Negative.   Neurological: Negative for dizziness, tremors, seizures, syncope, facial asymmetry, speech difficulty, weakness, light-headedness, numbness and headaches.  Hematological: Negative.   Psychiatric/Behavioral: Positive for agitation, confusion and sleep disturbance. Negative for dysphoric mood, hallucinations, self-injury and suicidal ideas. The patient is nervous/anxious. The patient is not hyperactive.   All other systems reviewed and are negative.       Objective:  BP 131/75   Pulse (!) 56   Temp (!) 97.3 F (36.3 C)   Resp 18   Ht '5\' 8"'  (1.727 m)   Wt 182 lb 6 oz (82.7 kg)    SpO2 99%   BMI 27.73 kg/m    Wt Readings from Last 3 Encounters:  11/13/19 182 lb 6 oz (82.7 kg)  10/16/19 183 lb (83 kg)  08/20/19 194 lb (88 kg)    Physical Exam Vitals and nursing note reviewed.  Constitutional:      General: He is not in acute distress.    Appearance: Normal appearance. He is well-developed, well-groomed and overweight. He is not ill-appearing, toxic-appearing or diaphoretic.  HENT:     Head: Normocephalic and atraumatic.     Jaw: There is normal jaw occlusion.     Right Ear: Hearing normal.     Left Ear: Hearing normal.     Nose: Nose normal.     Mouth/Throat:     Lips: Pink.     Mouth: Mucous membranes are moist.     Pharynx: Oropharynx is clear. Uvula midline.  Eyes:     General: Lids are normal.     Extraocular Movements: Extraocular movements intact.     Conjunctiva/sclera: Conjunctivae normal.     Pupils: Pupils are equal, round, and reactive to light.  Neck:     Thyroid: No thyroid mass, thyromegaly or thyroid tenderness.     Vascular: No carotid bruit or JVD.     Trachea: Trachea and phonation normal.  Cardiovascular:     Rate and Rhythm: Normal rate and regular rhythm.     Chest Wall: PMI is not displaced.     Pulses: Normal pulses.     Heart sounds: Normal heart sounds. No murmur. No friction rub. No gallop.   Pulmonary:     Effort: Pulmonary effort is normal. No respiratory distress.     Breath sounds: Normal breath sounds. No wheezing.  Abdominal:     General: Bowel sounds are normal. There is no distension or abdominal bruit.     Palpations: Abdomen is soft. There is no hepatomegaly or splenomegaly.     Tenderness: There is no abdominal tenderness. There is no right CVA tenderness or left CVA tenderness.     Hernia: No hernia is present.  Musculoskeletal:        General: Normal range of  motion.     Cervical back: Normal range of motion and neck supple.     Right lower leg: No edema.     Left lower leg: No edema.  Lymphadenopathy:      Cervical: No cervical adenopathy.  Skin:    General: Skin is warm and dry.     Capillary Refill: Capillary refill takes less than 2 seconds.     Coloration: Skin is not cyanotic, jaundiced or pale.     Findings: No rash.  Neurological:     General: No focal deficit present.     Mental Status: He is alert. Mental status is at baseline.     Cranial Nerves: Cranial nerves are intact.     Sensory: Sensation is intact.     Motor: Motor function is intact.     Coordination: Coordination is intact.     Gait: Gait abnormal (slow, unsteady).     Deep Tendon Reflexes: Reflexes are normal and symmetric.  Psychiatric:        Attention and Perception: Attention and perception normal.        Mood and Affect: Mood and affect normal.        Speech: Speech is delayed.        Behavior: Behavior is slowed. Behavior is cooperative.        Thought Content: Thought content normal.        Cognition and Memory: Memory is impaired. He exhibits impaired recent memory and impaired remote memory.        Judgment: Judgment normal.     Results for orders placed or performed in visit on 10/16/19  CBC with Differential/Platelet  Result Value Ref Range   WBC 5.7 3.4 - 10.8 x10E3/uL   RBC 4.94 4.14 - 5.80 x10E6/uL   Hemoglobin 14.0 13.0 - 17.7 g/dL   Hematocrit 41.6 37.5 - 51.0 %   MCV 84 79 - 97 fL   MCH 28.3 26.6 - 33.0 pg   MCHC 33.7 31.5 - 35.7 g/dL   RDW 13.3 11.6 - 15.4 %   Platelets 204 150 - 450 x10E3/uL   Neutrophils 69 Not Estab. %   Lymphs 17 Not Estab. %   Monocytes 11 Not Estab. %   Eos 2 Not Estab. %   Basos 0 Not Estab. %   Neutrophils Absolute 4.0 1.4 - 7.0 x10E3/uL   Lymphocytes Absolute 0.9 0.7 - 3.1 x10E3/uL   Monocytes Absolute 0.6 0.1 - 0.9 x10E3/uL   EOS (ABSOLUTE) 0.1 0.0 - 0.4 x10E3/uL   Basophils Absolute 0.0 0.0 - 0.2 x10E3/uL   Immature Granulocytes 1 Not Estab. %   Immature Grans (Abs) 0.0 0.0 - 0.1 x10E3/uL  BMP8+EGFR  Result Value Ref Range   Glucose 87 65 - 99  mg/dL   BUN 13 8 - 27 mg/dL   Creatinine, Ser 1.16 0.76 - 1.27 mg/dL   GFR calc non Af Amer 60 >59 mL/min/1.73   GFR calc Af Amer 69 >59 mL/min/1.73   BUN/Creatinine Ratio 11 10 - 24   Sodium 146 (H) 134 - 144 mmol/L   Potassium 2.9 (L) 3.5 - 5.2 mmol/L   Chloride 103 96 - 106 mmol/L   CO2 26 20 - 29 mmol/L   Calcium 9.6 8.6 - 10.2 mg/dL       Pertinent labs & imaging results that were available during my care of the patient were reviewed by me and considered in my medical decision making.  Assessment & Plan:  [name] was seen  today for face to face.  Diagnoses and all orders for this visit:  Memory difficulty Vertigo Situational anxiety Situational depression Nocturnal wandering Worsening memory impairment, vertigo, anxiety and depression when alone, and nocturnal wandering. Pt would benefit for care in the home during the day and night to help with ADL's and prevent injury due to vertigo, unsteady gait, and nocturnal wandering.     Continue all other maintenance medications.  Follow up plan: Return if symptoms worsen or fail to improve.  Continue healthy lifestyle choices, including diet (rich in fruits, vegetables, and lean proteins, and low in salt and simple carbohydrates) and exercise (at least 30 minutes of moderate physical activity daily).   The above assessment and management plan was discussed with the patient. The patient verbalized understanding of and has agreed to the management plan. Patient is aware to call the clinic if they develop any new symptoms or if symptoms persist or worsen. Patient is aware when to return to the clinic for a follow-up visit. Patient educated on when it is appropriate to go to the emergency department.   Monia Pouch, FNP-C [facility] [phone]

## 2019-11-15 ENCOUNTER — Ambulatory Visit: Payer: Medicare Other

## 2019-11-24 DIAGNOSIS — E876 Hypokalemia: Secondary | ICD-10-CM | POA: Diagnosis not present

## 2019-12-03 ENCOUNTER — Ambulatory Visit: Payer: Medicare Other | Admitting: Adult Health

## 2019-12-05 ENCOUNTER — Other Ambulatory Visit: Payer: Self-pay

## 2019-12-08 ENCOUNTER — Ambulatory Visit (INDEPENDENT_AMBULATORY_CARE_PROVIDER_SITE_OTHER): Payer: Medicare Other

## 2019-12-08 ENCOUNTER — Ambulatory Visit (INDEPENDENT_AMBULATORY_CARE_PROVIDER_SITE_OTHER): Payer: Medicare Other | Admitting: Family Medicine

## 2019-12-08 ENCOUNTER — Other Ambulatory Visit: Payer: Self-pay | Admitting: Family Medicine

## 2019-12-08 ENCOUNTER — Encounter: Payer: Self-pay | Admitting: Family Medicine

## 2019-12-08 ENCOUNTER — Other Ambulatory Visit: Payer: Self-pay

## 2019-12-08 VITALS — BP 141/87 | HR 64 | Temp 97.5°F | Resp 20 | Ht 68.0 in | Wt 177.0 lb

## 2019-12-08 DIAGNOSIS — R0781 Pleurodynia: Secondary | ICD-10-CM

## 2019-12-08 NOTE — Patient Instructions (Signed)
Rib Fracture  A rib fracture is a break or crack in one of the bones of the ribs. The ribs are like a cage that goes around your upper chest. A broken or cracked rib is often painful, but most do not cause other problems. Most rib fractures usually heal on their own in 1-3 months. Follow these instructions at home: Managing pain, stiffness, and swelling  If directed, apply ice to the injured area. ? Put ice in a plastic bag. ? Place a towel between your skin and the bag. ? Leave the ice on for 20 minutes, 2-3 times a day.  Take over-the-counter and prescription medicines only as told by your doctor. Activity  Avoid activities that cause pain to the injured area. Protect your injured area.  Slowly increase activity as told by your doctor. General instructions  Do deep breathing as told by your doctor. You may be told to: ? Take deep breaths many times a day. ? Cough many times a day while hugging a pillow. ? Use a device (incentive spirometer) to do deep breathing many times a day.  Drink enough fluid to keep your pee (urine) clear or pale yellow.  Do not wear a rib belt or binder. These do not allow you to breathe deeply.  Keep all follow-up visits as told by your doctor. This is important. Contact a doctor if:  You have a fever. Get help right away if:  You have trouble breathing.  You are short of breath.  You cannot stop coughing.  You cough up thick or bloody spit (sputum).  You feel sick to your stomach (nauseous), throw up (vomit), or have belly (abdominal) pain.  Your pain gets worse and medicine does not help. Summary  A rib fracture is a break or crack in one of the bones of the ribs.  Apply ice to the injured area and take medicines for pain as told by your doctor.  Take deep breaths and cough many times a day. Hug a pillow every time you cough. This information is not intended to replace advice given to you by your health care provider. Make sure you  discuss any questions you have with your health care provider. Document Revised: 09/07/2017 Document Reviewed: 12/26/2016 Elsevier Patient Education  2020 [employer]  

## 2019-12-08 NOTE — Progress Notes (Signed)
Subjective:  Patient ID: Ricky [name], male    DOB: 01-19-1941, 79 y.o.   MRN: [id number]  Patient Care Team: Baruch Gouty, FNP as PCP - General (Family Medicine) Celene Squibb, MD as Consulting Physician (Dermatology) Tanda Rockers, MD as Consulting Physician (Pulmonary Disease) Kathie Rhodes, MD as Consulting Physician (Urology) Rutherford Guys, MD as Consulting Physician (Ophthalmology) Rexene Agent, MD as Attending Physician (Nephrology)   Chief Complaint:  Fall (rib pain )   HPI: Ricky [name] is a 79 y.o. male presenting on 12/08/2019 for Fall (rib pain )   Pt presents with son today for right rib pain. Possible fall on Wednesday. Pt has dementia and does not recall an injury. Son reports tenderness to right anterior chest wall and complaints of pain with deep breathing.      Relevant past medical, surgical, family, and social history reviewed and updated as indicated.  Allergies and medications reviewed and updated. Date reviewed: Chart in Epic.   Past Medical History:  Diagnosis Date  . Chronic cough   . Deviated nasal septum   . 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 Family history of prostate cancer   . GERD (gastroesophageal reflux disease)   . History of adenomatous polyp of colon    2008; 2011  . History of exercise stress test    01-14-2009 by dr hochrein-- normal w/ no ischemia  . Hot flashes    DUE TO HORMONE THERAPY FOR PROSTATE CANCER  . Hyperlipidemia   . Hypertension   . Mild memory disturbance    per dr Jannifer Franklin note (neurologist)  . Nocturia more than twice per night   . Nocturnal leg cramps 03/23/2017  . OA (osteoarthritis)   . OSA (obstructive sleep apnea)    moderate osa per study 02-12-2017  cpap recommended--- per pt never heard back from doctor for obtaining cpap  . Pneumonia 02/2018  . Prostate cancer [facility]) urologist-  dr ottelin/  oncologist-  dr Tammi Klippel   dx 10-10-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, Gleason 3+4, PSA 9.02, vol 78cc  started ADT to decrease size;  08/ 2018 prostate decrease size vol 46cc and PSA 0.36--- scheduled for radiative prostate seed implants    Past Surgical History:  Procedure Laterality Date  . CATARACT EXTRACTION W/ INTRAOCULAR LENS  IMPLANT, BILATERAL  2016  . COLONOSCOPY  last one 08-08-2013  . CYSTOSCOPY N/A 08/10/2017   Procedure: CYSTOSCOPY FLEXIBLE;  Surgeon: Kathie Rhodes, MD;  Location: [facility];  Service: Urology;  Laterality: N/A;  NO SEEDS FOUND IN BLADDER  . HAND SURGERY Left 1992  . KNEE ARTHROSCOPY Bilateral 1960's and 70's  . PROSTATE BIOPSY  10-10-2016   dr Karsten Ro office  . RADIOACTIVE SEED IMPLANT N/A 08/10/2017   Procedure: RADIOACTIVE SEED IMPLANT/BRACHYTHERAPY IMPLANT;  Surgeon: Kathie Rhodes, MD;  Location: [facility];  Service: Urology;  Laterality: N/A;    64   SEEDS IMPLANTED  . ROTATOR CUFF REPAIR Right 2004  . SPACE OAR INSTILLATION N/A 08/10/2017   Procedure: SPACE OAR INSTILLATION;  Surgeon: Kathie Rhodes, MD;  Location: [facility];  Service: Urology;  Laterality: N/A;  . TOE SURGERY Left 1970   little toe  . TOTAL KNEE ARTHROPLASTY Bilateral 01-25-2009   dr Wynelle Link [facility]    Social History   Socioeconomic History  . Marital status: Married    Spouse name: [name]  . Number of children: 3  . Years of education: BA  .  Highest education level: Not on file  Occupational History  . Occupation: Retired  Tobacco Use  . Smoking status: Never Smoker  . Smokeless tobacco: Never Used  Substance and Sexual Activity  . Alcohol use: Yes    Comment: rare  . Drug use: No  . Sexual activity: Yes    Partners: Female    Comment: vasectomy  Other Topics Concern  . Not on file  Social History Narrative   Lives at home w/ his wife, patient has left his wife and is now staying with his son [name] as of 03/19/2019   Right-handed   Caffeine: none   Social Determinants of Health   Financial Resource Strain:   . Difficulty of  Paying Living Expenses: Not on file  Food Insecurity:   . Worried About Charity fundraiser in the Last Year: Not on file  . Ran Out of Food in the Last Year: Not on file  Transportation Needs:   . Lack of Transportation (Medical): Not on file  . Lack of Transportation (Non-Medical): Not on file  Physical Activity:   . Days of Exercise per Week: Not on file  . Minutes of Exercise per Session: Not on file  Stress:   . Feeling of Stress : Not on file  Social Connections:   . Frequency of Communication with Friends and Family: Not on file  . Frequency of Social Gatherings with Friends and Family: Not on file  . Attends Religious Services: Not on file  . Active Member of Clubs or Organizations: Not on file  . Attends Archivist Meetings: Not on file  . Marital Status: Not on file  Intimate Partner Violence:   . Fear of Current or Ex-Partner: Not on file  . Emotionally Abused: Not on file  . Physically Abused: Not on file  . Sexually Abused: Not on file    Outpatient Encounter Medications as of 12/08/2019  Medication Sig  . busPIRone (BUSPAR) 5 MG tablet   . Cholecalciferol (VITAMIN D PO) Take 1 capsule by mouth daily.   Marland Kitchen eplerenone (INSPRA) 25 MG tablet Take 25 mg by mouth 2 (two) times daily.  Marland Kitchen FIBER FORMULA PO Take 3 capsules by mouth 2 (two) times daily.  Marland Kitchen FLUoxetine (PROZAC) 40 MG capsule Take 1 capsule (40 mg total) by mouth daily.  . memantine (NAMENDA) 10 MG tablet TAKE 1 TABLET BY MOUTH TWICE A DAY  . Potassium Chloride ER 20 MEQ TBCR TAKE 1 TABLET (20MEQ) BY MOUTH TWICE DAILY  . tolterodine (DETROL LA) 4 MG 24 hr capsule Take 4 mg by mouth at bedtime.    No facility-administered encounter medications on file as of 12/08/2019.    No Known Allergies  Review of Systems  Constitutional: Negative for activity change, appetite change, chills, diaphoresis, fatigue, fever and unexpected weight change.  HENT: Negative.   Eyes: Negative.   Respiratory: Negative for  cough, chest tightness and shortness of breath.   Cardiovascular: Negative for chest pain, palpitations and leg swelling.  Gastrointestinal: Negative for abdominal pain, blood in stool, constipation, diarrhea, nausea and vomiting.  Endocrine: Negative.   Genitourinary: Negative for decreased urine volume, difficulty urinating, dysuria, frequency and urgency.  Musculoskeletal: Positive for arthralgias (right ribs), gait problem (using cane) and myalgias.  Skin: Negative.   Allergic/Immunologic: Negative.   Neurological: Negative for dizziness, tremors, seizures, syncope, facial asymmetry, speech difficulty, weakness, light-headedness, numbness and headaches.  Hematological: Negative.   Psychiatric/Behavioral: Positive for confusion. Negative for hallucinations, sleep disturbance  and suicidal ideas.  All other systems reviewed and are negative.       Objective:  BP (!) 141/87   Pulse 64   Temp (!) 97.5 F (36.4 C)   Resp 20   Ht 5' 8" (1.727 m)   Wt 177 lb (80.3 kg)   SpO2 99%   BMI 26.91 kg/m    Wt Readings from Last 3 Encounters:  12/08/19 177 lb (80.3 kg)  11/13/19 182 lb 6 oz (82.7 kg)  10/16/19 183 lb (83 kg)    Physical Exam Vitals and nursing note reviewed.  Constitutional:      General: He is not in acute distress.    Appearance: Normal appearance. He is well-developed and well-groomed. He is not ill-appearing, toxic-appearing or diaphoretic.  HENT:     Head: Normocephalic and atraumatic.     Jaw: There is normal jaw occlusion.     Right Ear: Hearing normal.     Left Ear: Hearing normal.     Nose: Nose normal.     Mouth/Throat:     Lips: Pink.     Mouth: Mucous membranes are moist.     Pharynx: Oropharynx is clear. Uvula midline.  Eyes:     General: Lids are normal.     Extraocular Movements: Extraocular movements intact.     Conjunctiva/sclera: Conjunctivae normal.     Pupils: Pupils are equal, round, and reactive to light.  Neck:     Thyroid: No thyroid  mass, thyromegaly or thyroid tenderness.     Vascular: No carotid bruit or JVD.     Trachea: Trachea and phonation normal.  Cardiovascular:     Rate and Rhythm: Normal rate and regular rhythm.     Chest Wall: PMI is not displaced.     Pulses: Normal pulses.     Heart sounds: Normal heart sounds. No murmur. No friction rub. No gallop.   Pulmonary:     Effort: Pulmonary effort is normal. No respiratory distress.     Breath sounds: Normal breath sounds. No wheezing, rhonchi or rales.  Chest:     Chest wall: Tenderness present. No mass, lacerations, deformity, swelling, crepitus or edema. There is no dullness to percussion.    Abdominal:     General: Bowel sounds are normal. There is no distension or abdominal bruit.     Palpations: Abdomen is soft. There is no hepatomegaly or splenomegaly.     Tenderness: There is no abdominal tenderness. There is no right CVA tenderness or left CVA tenderness.     Hernia: No hernia is present.  Musculoskeletal:        General: Normal range of motion.     Cervical back: Normal range of motion and neck supple.     Right lower leg: No edema.     Left lower leg: No edema.  Lymphadenopathy:     Cervical: No cervical adenopathy.  Skin:    General: Skin is warm and dry.     Capillary Refill: Capillary refill takes less than 2 seconds.     Coloration: Skin is not cyanotic, jaundiced or pale.     Findings: No rash.  Neurological:     General: No focal deficit present.     Mental Status: He is alert. Mental status is at baseline.     Cranial Nerves: Cranial nerves are intact.     Sensory: Sensation is intact.     Motor: Motor function is intact.     Coordination: Coordination is intact.  Gait: Gait abnormal (using cane).     Deep Tendon Reflexes: Reflexes are normal and symmetric.  Psychiatric:        Attention and Perception: Attention and perception normal.        Mood and Affect: Mood and affect normal.        Speech: Speech normal.         Behavior: Behavior normal. Behavior is cooperative.        Thought Content: Thought content normal.        Cognition and Memory: Cognition and memory normal.        Judgment: Judgment normal.     Results for orders placed or performed in visit on 10/16/19  CBC with Differential/Platelet  Result Value Ref Range   WBC 5.7 3.4 - 10.8 x10E3/uL   RBC 4.94 4.14 - 5.80 x10E6/uL   Hemoglobin 14.0 13.0 - 17.7 g/dL   Hematocrit 41.6 37.5 - 51.0 %   MCV 84 79 - 97 fL   MCH 28.3 26.6 - 33.0 pg   MCHC 33.7 31.5 - 35.7 g/dL   RDW 13.3 11.6 - 15.4 %   Platelets 204 150 - 450 x10E3/uL   Neutrophils 69 Not Estab. %   Lymphs 17 Not Estab. %   Monocytes 11 Not Estab. %   Eos 2 Not Estab. %   Basos 0 Not Estab. %   Neutrophils Absolute 4.0 1.4 - 7.0 x10E3/uL   Lymphocytes Absolute 0.9 0.7 - 3.1 x10E3/uL   Monocytes Absolute 0.6 0.1 - 0.9 x10E3/uL   EOS (ABSOLUTE) 0.1 0.0 - 0.4 x10E3/uL   Basophils Absolute 0.0 0.0 - 0.2 x10E3/uL   Immature Granulocytes 1 Not Estab. %   Immature Grans (Abs) 0.0 0.0 - 0.1 x10E3/uL  BMP8+EGFR  Result Value Ref Range   Glucose 87 65 - 99 mg/dL   BUN 13 8 - 27 mg/dL   Creatinine, Ser 1.16 0.76 - 1.27 mg/dL   GFR calc non Af Amer 60 >59 mL/min/1.73   GFR calc Af Amer 69 >59 mL/min/1.73   BUN/Creatinine Ratio 11 10 - 24   Sodium 146 (H) 134 - 144 mmol/L   Potassium 2.9 (L) 3.5 - 5.2 mmol/L   Chloride 103 96 - 106 mmol/L   CO2 26 20 - 29 mmol/L   Calcium 9.6 8.6 - 10.2 mg/dL     X-Ray: right ribs: concerning for right 7th rib fracture. Preliminary x-ray reading by Monia Pouch, FNP-C, [facility].   Pertinent labs & imaging results that were available during my care of the patient were reviewed by me and considered in my medical decision making.  Assessment & Plan:  [name] was seen today for fall.  Diagnoses and all orders for this visit:  Rib pain on right side Xray concerning for nondisplaced 7th right rib fracture. Radiology reading differs. Clinically  consistent with rib fracture. Symptomatic care discussed in detail. Incentive spirometer prescribed with information on proper details. -     DG Ribs Unilateral Right; Future -     For home use only DME Other see comment     Continue all other maintenance medications.  Follow up plan: Return in 6 weeks (on 01/19/2020), or if symptoms worsen or fail to improve, for rib fx.  Continue healthy lifestyle choices, including diet (rich in fruits, vegetables, and lean proteins, and low in salt and simple carbohydrates) and exercise (at least 30 minutes of moderate physical activity daily).  Educational handout given for rib fracture  The above assessment  and management plan was discussed with the patient. The patient verbalized understanding of and has agreed to the management plan. Patient is aware to call the clinic if they develop any new symptoms or if symptoms persist or worsen. Patient is aware when to return to the clinic for a follow-up visit. Patient educated on when it is appropriate to go to the emergency department.   Monia Pouch, FNP-C [facility] [phone]

## 2019-12-10 DIAGNOSIS — C61 Malignant neoplasm of prostate: Secondary | ICD-10-CM | POA: Diagnosis not present

## 2019-12-10 DIAGNOSIS — N3941 Urge incontinence: Secondary | ICD-10-CM | POA: Diagnosis not present

## 2019-12-11 ENCOUNTER — Telehealth: Payer: Self-pay | Admitting: *Deleted

## 2019-12-11 ENCOUNTER — Encounter: Payer: Self-pay | Admitting: Family Medicine

## 2019-12-11 ENCOUNTER — Ambulatory Visit (INDEPENDENT_AMBULATORY_CARE_PROVIDER_SITE_OTHER): Payer: Medicare Other | Admitting: Family Medicine

## 2019-12-11 DIAGNOSIS — F0390 Unspecified dementia without behavioral disturbance: Secondary | ICD-10-CM | POA: Diagnosis not present

## 2019-12-11 MED ORDER — BUSPIRONE HCL 5 MG PO TABS: 10.0000 mg | ORAL_TABLET | Freq: Three times a day (TID) | ORAL | 5 refills | Status: DC | PRN

## 2019-12-11 MED ORDER — QUETIAPINE FUMARATE 25 MG PO TABS: 25.0000 mg | ORAL_TABLET | Freq: Every day | ORAL | 1 refills | Status: DC

## 2019-12-11 NOTE — Telephone Encounter (Signed)
I called pharm- [facility] to check on cost of med for this pt:  Cash price for pt without using insurance will be $ 263  for 90 pills.

## 2019-12-11 NOTE — Telephone Encounter (Signed)
VM rcvd from [facility] Pt's Quetiapine has been denied, does not meet criteria May call back to [phone] opt 5

## 2019-12-11 NOTE — Telephone Encounter (Signed)
pts insurance denied the medication Quetiapine Fumarate 25 mg tabs.  Is there anything else that you would like him to try?  No alternative med suggestions were given

## 2019-12-11 NOTE — Telephone Encounter (Signed)
Called [facility]   $9 #30 $24 #90  Pt son called and aware

## 2019-12-11 NOTE — Telephone Encounter (Signed)
If he is unable to get the Seroquel due to cost, we can trial something else. I feel this is the best option.

## 2019-12-11 NOTE — Telephone Encounter (Signed)
It is on the 4 list at [facility] tell them to run it without insurance I will send it there

## 2019-12-11 NOTE — Addendum Note (Signed)
Addended by: Baruch Gouty on: 12/11/2019 04:28 PM   Modules accepted: Orders

## 2019-12-11 NOTE — Progress Notes (Signed)
Virtual Visit via telephone Note Due to COVID-19 pandemic this visit was conducted virtually. This visit type was conducted due to national recommendations for restrictions regarding the COVID-19 Pandemic (e.g. social distancing, sheltering in place) in an effort to limit this patient's exposure and mitigate transmission in our community. All issues noted in this document were discussed and addressed.  A physical exam was not performed with this format.   I connected with Ricky [name] son, Ricky [name] on 12/11/2019 at 1055 by telephone and verified that I am speaking with the correct person using two identifiers. Ricky [name] is currently located at home and family is currently with them during visit. The provider, Monia Pouch, FNP is located in their office at time of visit.  I discussed the limitations, risks, security and privacy concerns of performing an evaluation and management service by telephone and the availability of in person appointments. I also discussed with the patient that there may be a patient responsible charge related to this service. The patient expressed understanding and agreed to proceed.  Subjective:  Patient ID: Ricky [name], male    DOB: 1941/08/12, 79 y.o.   MRN: [id number]  Chief Complaint:  Agitation   HPI: Ricky [name] is a 79 y.o. male presenting on 12/11/2019 for Agitation   Son reports patient has increased anxiety, agitation, and insomnia.  States patient is very irritated and anxious.  States he does not sleep well at night and his behavior seems to be getting worse.  Patient has been taking medications as prescribed without complete relief of symptoms.  Patient is very agitated at home today.  Son would like to know if there is anything else we can do with medications to help decrease agitation, anxiety, and insomnia.    Relevant past medical, surgical, family, and social history reviewed and updated as indicated.  Allergies and medications reviewed and  updated.   Past Medical History:  Diagnosis Date  . Chronic cough   . Deviated nasal septum   . 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 Family history of prostate cancer   . GERD (gastroesophageal reflux disease)   . History of adenomatous polyp of colon    2008; 2011  . History of exercise stress test    01-14-2009 by dr hochrein-- normal w/ no ischemia  . Hot flashes    DUE TO HORMONE THERAPY FOR PROSTATE CANCER  . Hyperlipidemia   . Hypertension   . Mild memory disturbance    per dr Jannifer Franklin note (neurologist)  . Nocturia more than twice per night   . Nocturnal leg cramps 03/23/2017  . OA (osteoarthritis)   . OSA (obstructive sleep apnea)    moderate osa per study 02-12-2017  cpap recommended--- per pt never heard back from doctor for obtaining cpap  . Pneumonia 02/2018  . Prostate cancer [facility]) urologist-  dr ottelin/  oncologist-  dr Tammi Klippel   dx 10-10-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, Gleason 3+4, PSA 9.02, vol 78cc started ADT to decrease size;  08/ 2018 prostate decrease size vol 46cc and PSA 0.36--- scheduled for radiative prostate seed implants    Past Surgical History:  Procedure Laterality Date  . CATARACT EXTRACTION W/ INTRAOCULAR LENS  IMPLANT, BILATERAL  2016  . COLONOSCOPY  last one 08-08-2013  . CYSTOSCOPY N/A 08/10/2017   Procedure: CYSTOSCOPY FLEXIBLE;  Surgeon: Kathie Rhodes, MD;  Location: [facility];  Service: Urology;  Laterality: N/A;  NO SEEDS FOUND IN BLADDER  . HAND SURGERY Left  1992  . KNEE ARTHROSCOPY Bilateral 1960's and 70's  . PROSTATE BIOPSY  10-10-2016   dr Karsten Ro office  . RADIOACTIVE SEED IMPLANT N/A 08/10/2017   Procedure: RADIOACTIVE SEED IMPLANT/BRACHYTHERAPY IMPLANT;  Surgeon: Kathie Rhodes, MD;  Location: [facility];  Service: Urology;  Laterality: N/A;    64   SEEDS IMPLANTED  . ROTATOR CUFF REPAIR Right 2004  . SPACE OAR INSTILLATION N/A 08/10/2017   Procedure: SPACE OAR INSTILLATION;  Surgeon:  Kathie Rhodes, MD;  Location: [facility];  Service: Urology;  Laterality: N/A;  . TOE SURGERY Left 1970   little toe  . TOTAL KNEE ARTHROPLASTY Bilateral 01-25-2009   dr Wynelle Link [facility]    Social History   Socioeconomic History  . Marital status: Married    Spouse name: Ricky [name]  . Number of children: 3  . Years of education: BA  . Highest education level: Not on file  Occupational History  . Occupation: Retired  Tobacco Use  . Smoking status: Never Smoker  . Smokeless tobacco: Never Used  Substance and Sexual Activity  . Alcohol use: Yes    Comment: rare  . Drug use: No  . Sexual activity: Yes    Partners: Female    Comment: vasectomy  Other Topics Concern  . Not on file  Social History Narrative   Lives at home w/ his wife, patient has left his wife and is now staying with his son Ricky [name] as of 03/19/2019   Right-handed   Caffeine: none   Social Determinants of Health   Financial Resource Strain:   . Difficulty of Paying Living Expenses: Not on file  Food Insecurity:   . Worried About Charity fundraiser in the Last Year: Not on file  . Ran Out of Food in the Last Year: Not on file  Transportation Needs:   . Lack of Transportation (Medical): Not on file  . Lack of Transportation (Non-Medical): Not on file  Physical Activity:   . Days of Exercise per Week: Not on file  . Minutes of Exercise per Session: Not on file  Stress:   . Feeling of Stress : Not on file  Social Connections:   . Frequency of Communication with Friends and Family: Not on file  . Frequency of Social Gatherings with Friends and Family: Not on file  . Attends Religious Services: Not on file  . Active Member of Clubs or Organizations: Not on file  . Attends Archivist Meetings: Not on file  . Marital Status: Not on file  Intimate Partner Violence:   . Fear of Current or Ex-Partner: Not on file  . Emotionally Abused: Not on file  . Physically Abused: Not on file  . Sexually  Abused: Not on file    Outpatient Encounter Medications as of 12/11/2019  Medication Sig  . busPIRone (BUSPAR) 5 MG tablet Take 2 tablets (10 mg total) by mouth 3 (three) times daily as needed.  . Cholecalciferol (VITAMIN D PO) Take 1 capsule by mouth daily.   Marland Kitchen eplerenone (INSPRA) 25 MG tablet Take 25 mg by mouth 2 (two) times daily.  Marland Kitchen FIBER FORMULA PO Take 3 capsules by mouth 2 (two) times daily.  Marland Kitchen FLUoxetine (PROZAC) 40 MG capsule Take 1 capsule (40 mg total) by mouth daily.  . memantine (NAMENDA) 10 MG tablet TAKE 1 TABLET BY MOUTH TWICE A DAY  . Potassium Chloride ER 20 MEQ TBCR TAKE 1 TABLET (20MEQ) BY MOUTH TWICE DAILY  . QUEtiapine (  SEROQUEL) 25 MG tablet Take 1 tablet (25 mg total) by mouth at bedtime.  . tolterodine (DETROL LA) 4 MG 24 hr capsule Take 4 mg by mouth at bedtime.   . [DISCONTINUED] busPIRone (BUSPAR) 5 MG tablet    No facility-administered encounter medications on file as of 12/11/2019.    No Known Allergies  Review of Systems  Constitutional: Positive for activity change and appetite change. Negative for chills, diaphoresis, fatigue, fever and unexpected weight change.  Eyes: Negative for photophobia and visual disturbance.  Respiratory: Positive for shortness of breath. Negative for cough and chest tightness.   Cardiovascular: Negative for chest pain, palpitations and leg swelling.  Gastrointestinal: Negative for abdominal pain.  Genitourinary: Negative for decreased urine volume and difficulty urinating.  Neurological: Negative for dizziness, light-headedness and headaches.  Psychiatric/Behavioral: Positive for agitation, behavioral problems, confusion, hallucinations and sleep disturbance. Negative for decreased concentration, dysphoric mood, self-injury and suicidal ideas. The patient is nervous/anxious. The patient is not hyperactive.   All other systems reviewed and are negative.        Observations/Objective: No vital signs or physical exam, this was  a telephone or virtual health encounter.  Pt alert and oriented, answers all questions appropriately, and able to speak in full sentences.    Assessment and Plan: [name] was seen today for agitation.  Diagnoses and all orders for this visit:  Dementia without behavioral disturbance, unspecified dementia type ([place]) Ongoing and worsening anxiety, agitation, and insomnia with dementia.  Will add Seroquel 25 mg nightly and increase BuSpar to 10 mg 3 times daily as needed.  Son aware to report any new, worsening, or persistent symptoms.  Follow-up in 4 weeks for reevaluation. -     QUEtiapine (SEROQUEL) 25 MG tablet; Take 1 tablet (25 mg total) by mouth at bedtime. -     busPIRone (BUSPAR) 5 MG tablet; Take 2 tablets (10 mg total) by mouth 3 (three) times daily as needed.     Follow Up Instructions: Return in about 4 weeks (around 01/08/2020), or if symptoms worsen or fail to improve, for dementia, agitation.    I discussed the assessment and treatment plan with the patient. The patient was provided an opportunity to ask questions and all were answered. The patient agreed with the plan and demonstrated an understanding of the instructions.   The patient was advised to call back or seek an in-person evaluation if the symptoms worsen or if the condition fails to improve as anticipated.  The above assessment and management plan was discussed with the patient. The patient verbalized understanding of and has agreed to the management plan. Patient is aware to call the clinic if they develop any new symptoms or if symptoms persist or worsen. Patient is aware when to return to the clinic for a follow-up visit. Patient educated on when it is appropriate to go to the emergency department.    I provided 15 minutes of non-face-to-face time during this encounter. The call started at 1055. The call ended at 1110. The other time was used for coordination of care.    Monia Pouch, FNP-C [facility] [address] [place], [place] [address] [phone] 12/11/2019

## 2019-12-11 NOTE — Telephone Encounter (Signed)
Prior Auth for Quetiapine 25mg -IN Process  ([id number]) [id number]   Your information has been submitted to [place]. [place] will review the request and notify you of the determination decision directly, typically within 3 business days of your submission and once all necessary information is received.  You will also receive your request decision electronically. To check for an update later, open the request again from your dashboard.  If [employer] [place] has not responded within the specified timeframe or if you have any questions about your PA submission, contact Helen  directly at [facility]) [phone] or ([place]) [phone].

## 2019-12-11 NOTE — Telephone Encounter (Signed)
[facility] :  Key: [id number]

## 2019-12-18 ENCOUNTER — Other Ambulatory Visit: Payer: Self-pay | Admitting: Family Medicine

## 2019-12-18 DIAGNOSIS — F0390 Unspecified dementia without behavioral disturbance: Secondary | ICD-10-CM

## 2019-12-30 DIAGNOSIS — N1832 Chronic kidney disease, stage 3b: Secondary | ICD-10-CM | POA: Diagnosis not present

## 2020-01-08 ENCOUNTER — Ambulatory Visit (INDEPENDENT_AMBULATORY_CARE_PROVIDER_SITE_OTHER): Payer: Medicare Other | Admitting: Nurse Practitioner

## 2020-01-08 ENCOUNTER — Ambulatory Visit (INDEPENDENT_AMBULATORY_CARE_PROVIDER_SITE_OTHER): Payer: Medicare Other

## 2020-01-08 ENCOUNTER — Encounter: Payer: Self-pay | Admitting: Nurse Practitioner

## 2020-01-08 ENCOUNTER — Other Ambulatory Visit: Payer: Self-pay

## 2020-01-08 VITALS — BP 158/90 | HR 65 | Temp 98.4°F | Resp 20 | Ht 68.0 in | Wt 177.0 lb

## 2020-01-08 DIAGNOSIS — R1084 Generalized abdominal pain: Secondary | ICD-10-CM | POA: Diagnosis not present

## 2020-01-08 NOTE — Progress Notes (Signed)
   Subjective:    Patient ID: Ricky [name], male    DOB: [birth year], 79 y.o.   MRN: [id number]   Chief Complaint: Abdominal Pain   HPI Patient is brought in today by his son. He is c/o having rocks in his stomach. When asked to explain he says that has fluid in his stomach that bounces around. At night when he lays still he does not feel anything. Hurts if presses on area.    Review of Systems  Gastrointestinal: Positive for abdominal pain and diarrhea (has frequent diarrhea.). Negative for constipation, nausea and vomiting.  All other systems reviewed and are negative.      Objective:   Physical Exam Vitals and nursing note reviewed.  Constitutional:      Appearance: He is well-developed.  Abdominal:     General: Bowel sounds are increased.     Palpations: Abdomen is soft.     Tenderness: There is generalized abdominal tenderness.     Hernia: A hernia is present. Hernia is present in the umbilical area.  Skin:    General: Skin is warm.  Neurological:     General: No focal deficit present.     Mental Status: He is alert.    BP (!) 158/90   Pulse 65   Temp 98.4 F (36.9 C) (Temporal)   Resp 20   Ht 5\' 8"  (1.727 m)   Wt 177 lb (80.3 kg)   SpO2 100%   BMI 26.91 kg/m   KUB- some stool in colon with a few gas pockets      Assessment & Plan:  Harvinder Kozub in today with chief complaint of Abdominal Pain   1. Generalized abdominal pain gasx or beano OTC Will call once CT scan is complete - DG Abd 1 View; Future - CT Abdomen Pelvis Wo Contrast; Future    The above assessment and management plan was discussed with the patient. The patient verbalized understanding of and has agreed to the management plan. Patient is aware to call the clinic if symptoms persist or worsen. Patient is aware when to return to the clinic for a follow-up visit. Patient educated on when it is appropriate to go to the emergency department.   Mary-Margaret Hassell Done, FNP

## 2020-01-08 NOTE — Patient Instructions (Signed)

## 2020-01-13 ENCOUNTER — Telehealth: Payer: Self-pay | Admitting: Nurse Practitioner

## 2020-01-13 NOTE — Telephone Encounter (Signed)
Talked to Courtney 

## 2020-01-16 ENCOUNTER — Other Ambulatory Visit: Payer: Self-pay

## 2020-01-16 ENCOUNTER — Ambulatory Visit (HOSPITAL_COMMUNITY)
Admission: RE | Admit: 2020-01-16 | Discharge: 2020-01-16 | Disposition: A | Discharge status: Home / Self Care | Payer: Medicare Other | Source: Ambulatory Visit | Attending: Nurse Practitioner | Admitting: Nurse Practitioner

## 2020-01-16 DIAGNOSIS — R1084 Generalized abdominal pain: Secondary | ICD-10-CM | POA: Diagnosis not present

## 2020-01-21 ENCOUNTER — Telehealth: Payer: Self-pay | Admitting: Family Medicine

## 2020-01-21 NOTE — Telephone Encounter (Signed)
Aware ppw ready

## 2020-01-23 ENCOUNTER — Encounter (HOSPITAL_COMMUNITY): Payer: Self-pay | Admitting: *Deleted

## 2020-01-23 ENCOUNTER — Emergency Department (HOSPITAL_COMMUNITY): Payer: Medicare Other

## 2020-01-23 ENCOUNTER — Other Ambulatory Visit: Payer: Self-pay

## 2020-01-23 ENCOUNTER — Emergency Department (HOSPITAL_COMMUNITY)
Admission: EM | Admit: 2020-01-23 | Discharge: 2020-01-23 | Disposition: A | Discharge status: Home / Self Care | Payer: Medicare Other | Attending: Emergency Medicine | Admitting: Emergency Medicine

## 2020-01-23 DIAGNOSIS — Z8546 Personal history of malignant neoplasm of prostate: Secondary | ICD-10-CM | POA: Insufficient documentation

## 2020-01-23 DIAGNOSIS — Z96653 Presence of artificial knee joint, bilateral: Secondary | ICD-10-CM | POA: Insufficient documentation

## 2020-01-23 DIAGNOSIS — K8020 Calculus of gallbladder without cholecystitis without obstruction: Secondary | ICD-10-CM | POA: Insufficient documentation

## 2020-01-23 DIAGNOSIS — R1011 Right upper quadrant pain: Secondary | ICD-10-CM

## 2020-01-23 DIAGNOSIS — R748 Abnormal levels of other serum enzymes: Secondary | ICD-10-CM | POA: Insufficient documentation

## 2020-01-23 DIAGNOSIS — I10 Essential (primary) hypertension: Secondary | ICD-10-CM | POA: Diagnosis not present

## 2020-01-23 DIAGNOSIS — F0390 Unspecified dementia without behavioral disturbance: Secondary | ICD-10-CM | POA: Insufficient documentation

## 2020-01-23 LAB — COMPREHENSIVE METABOLIC PANEL
ALT: 16 U/L (ref 0–44)
AST: 17 U/L (ref 15–41)
Albumin: 3.9 g/dL (ref 3.5–5.0)
Alkaline Phosphatase: 75 U/L (ref 38–126)
Anion gap: 7 (ref 5–15)
BUN: 30 mg/dL — ABNORMAL HIGH (ref 8–23)
CO2: 24 mmol/L (ref 22–32)
Calcium: 9.4 mg/dL (ref 8.9–10.3)
Chloride: 107 mmol/L (ref 98–111)
Creatinine, Ser: 1.33 mg/dL — ABNORMAL HIGH (ref 0.61–1.24)
GFR calc Af Amer: 59 mL/min — ABNORMAL LOW (ref >60)
GFR calc non Af Amer: 51 mL/min — ABNORMAL LOW (ref >60)
Glucose, Bld: 90 mg/dL (ref 70–99)
Potassium: 4.1 mmol/L (ref 3.5–5.1)
Sodium: 138 mmol/L (ref 135–145)
Total Bilirubin: 0.7 mg/dL (ref 0.3–1.2)
Total Protein: 6 g/dL — ABNORMAL LOW (ref 6.5–8.1)

## 2020-01-23 LAB — CBC
HCT: 38.5 % — ABNORMAL LOW (ref 39.0–52.0)
Hemoglobin: 12.8 g/dL — ABNORMAL LOW (ref 13.0–17.0)
MCH: 28.9 pg (ref 26.0–34.0)
MCHC: 33.2 g/dL (ref 30.0–36.0)
MCV: 86.9 fL (ref 80.0–100.0)
Platelets: 187 10*3/uL (ref 150–400)
RBC: 4.43 MIL/uL (ref 4.22–5.81)
RDW: 13.1 % (ref 11.5–15.5)
WBC: 4.4 10*3/uL (ref 4.0–10.5)
nRBC: 0 % (ref 0.0–0.2)

## 2020-01-23 LAB — URINALYSIS, ROUTINE W REFLEX MICROSCOPIC
Bilirubin Urine: NEGATIVE
Glucose, UA: NEGATIVE mg/dL
Hgb urine dipstick: NEGATIVE
Ketones, ur: NEGATIVE mg/dL
Leukocytes,Ua: NEGATIVE
Nitrite: NEGATIVE
Protein, ur: NEGATIVE mg/dL
Specific Gravity, Urine: 1.017 (ref 1.005–1.030)
pH: 5 (ref 5.0–8.0)

## 2020-01-23 LAB — LIPASE, BLOOD: Lipase: 106 U/L — ABNORMAL HIGH (ref 11–51)

## 2020-01-23 LAB — TSH: TSH: 0.95 u[IU]/mL (ref 0.350–4.500)

## 2020-01-23 NOTE — ED Triage Notes (Signed)
C/o abdominal pain, Family states he has been agitated this am

## 2020-01-23 NOTE — Clinical Social Work Note (Signed)
Transition of Care [facility]) - Emergency Department Mini Assessment  Patient Details  Name: Ricky [name] MRN: [id number] Date of Birth: [birth year]  Transition of Care [facility]) CM/SW Contact:    Sherie Don, LCSW Phone Number: 01/23/2020, 4:38 PM  Clinical Narrative: TOC received consult for possible SNF placement. CSW met with patient and son, Jeshua Ransford. Per son, the family is looking at possibly placing the patient at [facility] in the memory care unit. CSW called [facility] and spoke with [place]. Colletta Maryland requested documentation for patient's ED visit to assist with the application process. CSW received verbal permission for documentation to be sent. Son reported he is comfortable taking the patient home at this time and expect to visit [facility] next week to see if it is a good fit.  ED Mini Assessment: What brought you to the Emergency Department? : Abdominal pain  Barriers to Discharge: Barriers Resolved  Barrier interventions: Referral for [facility] memory care unit  Means of departure: Car  Interventions which prevented an admission or readmission: Other (must enter comment)(Referral for ALF w/memory care)  Patient Contact and Communications Key Contact 1: Sinan Tuch (son) Key Contact 2: Colletta [place] ([facility]) Spoke with: Colletta [place] ([facility]) Contact Date: 01/23/20,   Contact time: 1455 Contact Phone Number: [phone] Call outcome: Documents will be faxed to [facility]  Patient states their goals for this hospitalization and ongoing recovery are:: Return home CMS Medicare.gov Compare Post Acute Care list provided to:: Patient Represenative (must comment)(Keith Minton (son)) Choice offered to / list presented to : Adult Children  Admission diagnosis:  AMS,ABD PAIN Patient Active Problem List   Diagnosis Date Noted  . Nocturnal wandering 11/13/2019  . Left breast mass 08/20/2019  . Situational anxiety 07/30/2019  . Situational  depression 07/30/2019  . Abnormal kidney function 05/15/2019  . Vertigo 05/01/2019  . Sleep apnea in adult 04/08/2017  . Nocturnal leg cramps 03/23/2017  . Family history of breast cancer 12/13/2016  . Family history of prostate cancer   . Malignant neoplasm prostate ([place]) 11/13/2016  . Memory difficulty 09/22/2016  . Headache syndrome 09/22/2016  . Chronic cough 05/24/2016  . Laryngopharyngeal reflux (LPR) 02/03/2016  . Cough variant asthma vs UACS  11/12/2015  . Hyperlipidemia 10/15/2015  . Vitamin D deficiency 03/12/2014  . BPH (benign prostatic hyperplasia) 10/01/2013  . Erectile dysfunction 10/01/2013  . PERSONAL HX COLONIC POLYPS 01/25/2010  . 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 Essential hypertension 01/13/2009   PCP:  Baruch Gouty, FNP [facility]   [id number] [place] [id number]: [phone]: [phone] [address] [place]Phone: [phone]: [phone]

## 2020-01-23 NOTE — Discharge Instructions (Addendum)
Schedule an appointment with Dr. Constance Haw to discuss surgical options regarding the gallstones.  Return to the ED for troubles with fever or vomiting.  Follow-up with his primary care doctor to have the blood test rechecked in about a week

## 2020-01-23 NOTE — ED Provider Notes (Signed)
[name]:  [id number]  Arrival date & time: 01/23/20     Chief Complaint   Abdominal Pain   History of Present Illness   Ricky [name] is a 79 y.o. year-old male with a history of prostate cancer, hypertension presenting to the ED with chief complaint of abdominal pain.  Abdominal pain sudden onset this morning.  Patient is also having worsening agitation and foul mood for the past 2 days.  Son at bedside explains that he has had a significant cognitive decline over the past 8 months, has been diagnosed with dementia.  Currently living at home and being cared for by son and 2 health aides but this is proving to be difficult.  No recent fever, no chest pain, no falls.  I was unable to obtain an accurate HPI, PMH, or ROS due to the patient's dementia.  Level 5 caveat.  Review of Systems  Positive for abdominal pain, dementia.  Patient's Health History    Past Medical History:  Diagnosis Date  . Chronic cough   . Deviated nasal septum   . 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 Family history of prostate cancer   . GERD (gastroesophageal reflux disease)   . History of adenomatous polyp of colon    2008; 2011  . History of exercise stress test    01-14-2009 by dr hochrein-- normal w/ no ischemia  . Hot flashes    DUE TO HORMONE THERAPY FOR PROSTATE CANCER  . Hyperlipidemia   . Hypertension   . Mild memory disturbance    per dr Jannifer Franklin note (neurologist)  . Nocturia more than twice per night   . Nocturnal leg cramps 03/23/2017  . OA (osteoarthritis)   . OSA (obstructive sleep apnea)    moderate osa per study 02-12-2017  cpap recommended--- per pt never heard back from doctor for obtaining cpap  . Pneumonia 02/2018  . Prostate cancer [facility]) urologist-  dr ottelin/  oncologist-  dr Tammi Klippel   dx 10-10-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, Gleason 3+4, PSA 9.02, vol 78cc started ADT to decrease size;  08/ 2018  prostate decrease size vol 46cc and PSA 0.36--- scheduled for radiative prostate seed implants    Past Surgical History:  Procedure Laterality Date  . CATARACT EXTRACTION W/ INTRAOCULAR LENS  IMPLANT, BILATERAL  2016  . COLONOSCOPY  last one 08-08-2013  . CYSTOSCOPY N/A 08/10/2017   Procedure: CYSTOSCOPY FLEXIBLE;  Surgeon: Kathie Rhodes, MD;  Location: [facility];  Service: Urology;  Laterality: N/A;  NO SEEDS FOUND IN BLADDER  . HAND SURGERY Left 1992  . KNEE ARTHROSCOPY Bilateral 1960's and 70's  . PROSTATE BIOPSY  10-10-2016   dr Karsten Ro office  . RADIOACTIVE SEED IMPLANT N/A 08/10/2017   Procedure: RADIOACTIVE SEED IMPLANT/BRACHYTHERAPY IMPLANT;  Surgeon: Kathie Rhodes, MD;  Location: [facility];  Service: Urology;  Laterality: N/A;    64   SEEDS IMPLANTED  . ROTATOR CUFF REPAIR Right 2004  . SPACE OAR INSTILLATION N/A 08/10/2017   Procedure: SPACE OAR INSTILLATION;  Surgeon: Kathie Rhodes, MD;  Location: [facility];  Service: Urology;  Laterality: N/A;  . TOE SURGERY Left 1970   little toe  . TOTAL KNEE ARTHROPLASTY Bilateral 01-25-2009   dr Wynelle Link [facility]    Family History  Problem Relation Age of Onset  . Healthy Mother   . Prostate cancer Father        Dx 76s; Deceased  26  . Prostate cancer Brother 57       currently 62  . Alcohol abuse Brother   . Bipolar disorder Brother   . Prostate cancer Paternal Uncle        unsure of age; possibly 2nd uncle also has prostate ca  . Prostate cancer Maternal Uncle        unsure of age  . Breast cancer Maternal Aunt        unsure of age  . Colon cancer Neg Hx   . Esophageal cancer Neg Hx   . Rectal cancer Neg Hx   . Stomach cancer Neg Hx     Social History   Socioeconomic History  . Marital status: Divorced    Spouse name: [name]  . Number of children: 3  . Years of education: BA  . Highest education level: Not on file  Occupational History  . Occupation: Retired  Tobacco Use  .  Smoking status: Never Smoker  . Smokeless tobacco: Never Used  Substance and Sexual Activity  . Alcohol use: Yes    Comment: rare  . Drug use: No  . Sexual activity: Yes    Partners: Female    Comment: vasectomy  Other Topics Concern  . Not on file  Social History Narrative   Lives at home w/ his wife, patient has left his wife and is now staying with his son [name] as of 03/19/2019   Right-handed   Caffeine: none   Social Determinants of Health   Financial Resource Strain:   . Difficulty of Paying Living Expenses:   Food Insecurity:   . Worried About Charity fundraiser in the Last Year:   . Arboriculturist in the Last Year:   Transportation Needs:   . Film/video editor (Medical):   Marland Kitchen Lack of Transportation (Non-Medical):   Physical Activity:   . Days of Exercise per Week:   . Minutes of Exercise per Session:   Stress:   . Feeling of Stress :   Social Connections:   . Frequency of Communication with Friends and Family:   . Frequency of Social Gatherings with Friends and Family:   . Attends Religious Services:   . Active Member of Clubs or Organizations:   . Attends Archivist Meetings:   Marland Kitchen Marital Status:   Intimate Partner Violence:   . Fear of Current or Ex-Partner:   . Emotionally Abused:   Marland Kitchen Physically Abused:   . Sexually Abused:      Physical Exam   Vitals:   01/23/20 1328  BP: (!) 172/91  Pulse: 62  Resp: 18  Temp: (!) 97.5 F (36.4 C)  SpO2: 97%    CONSTITUTIONAL: Chronically ill-appearing, NAD NEURO: Awake, alert, not oriented, makes nonsensical phrases and short sentences, moves all extremities EYES:  eyes equal and reactive ENT/NECK:  no LAD, no JVD CARDIO: Regular rate, well-perfused, normal S1 and S2 PULM:  CTAB no wheezing or rhonchi GI/GU:  normal bowel sounds, non-distended, non-tender MSK/SPINE:  No gross deformities, no edema SKIN:  no rash, atraumatic PSYCH:  Appropriate speech and behavior  *Additional and/or  pertinent findings included in MDM below  Diagnostic and Interventional Summary    EKG Interpretation  Date/Time:    Ventricular Rate:    PR Interval:    QRS Duration:   QT Interval:    QTC Calculation:   R Axis:     Text Interpretation:        Labs Reviewed  CBC  COMPREHENSIVE METABOLIC PANEL  LIPASE, BLOOD  TSH  URINALYSIS, ROUTINE W REFLEX MICROSCOPIC    CT HEAD WO CONTRAST    (Results Pending)  US Abdomen Limited RUQ    (Results Pending)    Medications - No data to display   Procedures  /  Critical Care Procedures  ED Course and Medical Decision Making  I have reviewed the triage vital signs, the nursing notes, and pertinent available records from the EMR.  Listed above are laboratory and imaging tests that I personally ordered, reviewed, and interpreted and then considered in my medical decision making (see below for details).      Patient has a working or clinical diagnosis of dementia but I do not see any CNS imaging recently.  Will CT to exclude other possibilities such as neoplasm or NPH.  Patient has a recent CT scan demonstrating cholelithiasis, suspect biliary colic as the cause of his pain, will obtain ultrasound to exclude signs of cholecystitis.  Seems the patient is mostly here for assistance with placement, will consult case management.  With a negative medical work-up would await placement.  Signed out to oncoming provider at shift change.    Barth Kirks. [place] Small, [place] [email or web address]  Final Clinical Impressions(s) / ED Diagnoses     ICD-10-CM   1. Dementia without behavioral disturbance, unspecified dementia type ([place])  F03.90   2. RUQ abdominal pain  R10.11 US Abdomen Limited RUQ    US Abdomen Limited RUQ    ED Discharge Orders    None       Discharge Instructions Discussed with and Provided to Patient:   Discharge Instructions   None       Maudie Flakes, MD 01/23/20  1408

## 2020-01-23 NOTE — ED Provider Notes (Addendum)
Lipase elevated.  [place] without signs of cholecystitis.  Pt has not been having any nausea or vomiting.  Re examined pt.  No focal ttp.  Doubt acute pancreatitis.  Will refer to surgery as an outpatient.  Son has met with social work.  He is ready to take pt home.     Dorie Rank, MD 01/23/20 [phone]

## 2020-02-05 ENCOUNTER — Other Ambulatory Visit: Payer: Self-pay

## 2020-02-05 ENCOUNTER — Ambulatory Visit (INDEPENDENT_AMBULATORY_CARE_PROVIDER_SITE_OTHER): Payer: Medicare Other | Admitting: General Surgery

## 2020-02-05 ENCOUNTER — Encounter: Payer: Self-pay | Admitting: General Surgery

## 2020-02-05 VITALS — BP 153/91 | HR 69 | Temp 97.2°F | Resp 14 | Ht 65.5 in | Wt 174.0 lb

## 2020-02-05 DIAGNOSIS — K8020 Calculus of gallbladder without cholecystitis without obstruction: Secondary | ICD-10-CM | POA: Diagnosis not present

## 2020-02-05 NOTE — Progress Notes (Signed)
Ricky [name]; [id number]; May 05, 1941   HPI Patient is a 79 year old white male with multiple medical problems who was referred to my care by Darla Lesches for evaluation treatment of cholelithiasis.  This was seen on a recent emergency room visit.  Patient is present with his son.  Patient reports intermittent episodes of right flank pain when he is lying down.  He has no nausea, vomiting, fatty food intolerance, fever, or jaundice.  Also when he lies down, he has some back pain.  He eats whatever he wants to.  He currently has 0 out of 10 abdominal pain.  In the emergency room, his liver enzyme tests were within normal limits.  He was only noted to have a elevated lipase, the etiology is unknown.  He does not drink alcohol. Past Medical History:  Diagnosis Date  . Chronic cough   . Deviated nasal septum   . 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 Family history of prostate cancer   . GERD (gastroesophageal reflux disease)   . History of adenomatous polyp of colon    2008; 2011  . History of exercise stress test    01-14-2009 by dr hochrein-- normal w/ no ischemia  . Hot flashes    DUE TO HORMONE THERAPY FOR PROSTATE CANCER  . Hyperlipidemia   . Hypertension   . Mild memory disturbance    per dr Jannifer Franklin note (neurologist)  . Nocturia more than twice per night   . Nocturnal leg cramps 03/23/2017  . OA (osteoarthritis)   . OSA (obstructive sleep apnea)    moderate osa per study 02-12-2017  cpap recommended--- per pt never heard back from doctor for obtaining cpap  . Pneumonia 02/2018  . Prostate cancer [facility]) urologist-  dr ottelin/  oncologist-  dr Tammi Klippel   dx 10-10-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, Gleason 3+4, PSA 9.02, vol 78cc started ADT to decrease size;  08/ 2018 prostate decrease size vol 46cc and PSA 0.36--- scheduled for radiative prostate seed implants    Past Surgical History:  Procedure Laterality Date  . CATARACT EXTRACTION W/ INTRAOCULAR LENS  IMPLANT, BILATERAL  2016   . COLONOSCOPY  last one 08-08-2013  . CYSTOSCOPY N/A 08/10/2017   Procedure: CYSTOSCOPY FLEXIBLE;  Surgeon: Kathie Rhodes, MD;  Location: [facility];  Service: Urology;  Laterality: N/A;  NO SEEDS FOUND IN BLADDER  . HAND SURGERY Left 1992  . KNEE ARTHROSCOPY Bilateral 1960's and 70's  . PROSTATE BIOPSY  10-10-2016   dr Karsten Ro office  . RADIOACTIVE SEED IMPLANT N/A 08/10/2017   Procedure: RADIOACTIVE SEED IMPLANT/BRACHYTHERAPY IMPLANT;  Surgeon: Kathie Rhodes, MD;  Location: [facility];  Service: Urology;  Laterality: N/A;    64   SEEDS IMPLANTED  . ROTATOR CUFF REPAIR Right 2004  . SPACE OAR INSTILLATION N/A 08/10/2017   Procedure: SPACE OAR INSTILLATION;  Surgeon: Kathie Rhodes, MD;  Location: [facility];  Service: Urology;  Laterality: N/A;  . TOE SURGERY Left 1970   little toe  . TOTAL KNEE ARTHROPLASTY Bilateral 01-25-2009   dr Wynelle Link [facility]    Family History  Problem Relation Age of Onset  . Healthy Mother   . Prostate cancer Father        Dx 79s; Deceased 78  . Prostate cancer Brother 7       currently 72  . Alcohol abuse Brother   . Bipolar disorder Brother   . Prostate cancer Paternal Uncle  unsure of age; possibly 2nd uncle also has prostate ca  . Prostate cancer Maternal Uncle        unsure of age  . Breast cancer Maternal Aunt        unsure of age  . Colon cancer Neg Hx   . Esophageal cancer Neg Hx   . Rectal cancer Neg Hx   . Stomach cancer Neg Hx     Current Outpatient Medications on File Prior to Visit  Medication Sig Dispense Refill  . busPIRone (BUSPAR) 5 MG tablet TAKE 2 TABLETS (10 MG TOTAL) BY MOUTH 3 (THREE) TIMES DAILY AS NEEDED. (Patient taking differently: Take 5 mg by mouth 2 (two) times daily. ) 540 tablet 1  . eplerenone (INSPRA) 50 MG tablet Take 50 mg by mouth 2 (two) times daily.    Marland Kitchen FIBER FORMULA PO Take 3 capsules by mouth 2 (two) times daily.    Marland Kitchen FLUoxetine (PROZAC) 40 MG capsule Take 1  capsule (40 mg total) by mouth daily. 90 capsule 3  . memantine (NAMENDA) 10 MG tablet TAKE 1 TABLET BY MOUTH TWICE A DAY (Patient taking differently: Take 10 mg by mouth 2 (two) times daily. ) 180 tablet 4  . Potassium Chloride ER 20 MEQ TBCR TAKE 1 TABLET (20MEQ) BY MOUTH TWICE DAILY (Patient taking differently: Take 20 mEq by mouth in the morning and at bedtime. ) 180 tablet 0  . QUEtiapine (SEROQUEL) 25 MG tablet Take 1 tablet (25 mg total) by mouth at bedtime. 90 tablet 1  . spironolactone (ALDACTONE) 50 MG tablet Take 50 mg by mouth daily.    Marland Kitchen tolterodine (DETROL LA) 4 MG 24 hr capsule Take 4 mg by mouth at bedtime.   10   No current facility-administered medications on file prior to visit.    No Known Allergies  Social History   Substance and Sexual Activity  Alcohol Use Yes   Comment: rare    Social History   Tobacco Use  Smoking Status Never Smoker  Smokeless Tobacco Never Used    Review of Systems  Constitutional: Positive for malaise/fatigue.  HENT: Negative.   Eyes: Positive for pain.  Respiratory: Negative.   Cardiovascular: Negative.   Gastrointestinal: Positive for abdominal pain.  Genitourinary: Positive for frequency.  Musculoskeletal: Positive for joint pain.  Skin: Negative.   Neurological: Negative.   Endo/Heme/Allergies: Negative.   Psychiatric/Behavioral: Negative.     Objective   Vitals:   02/05/20 0902  BP: (!) 153/91  Pulse: 69  Resp: 14  Temp: (!) 97.2 F (36.2 C)  SpO2: 97%    Physical Exam Vitals reviewed.  Constitutional:      Appearance: Normal appearance. He is normal weight. He is not ill-appearing.  HENT:     Head: Normocephalic and atraumatic.  Eyes:     General: No scleral icterus. Cardiovascular:     Rate and Rhythm: Normal rate and regular rhythm.     Heart sounds: Normal heart sounds. No murmur. No friction rub. No gallop.   Pulmonary:     Effort: Pulmonary effort is normal. No respiratory distress.     Breath  sounds: Normal breath sounds. No stridor. No wheezing, rhonchi or rales.  Abdominal:     General: Abdomen is flat. Bowel sounds are normal. There is no distension.     Palpations: Abdomen is soft. There is no mass.     Tenderness: There is no abdominal tenderness. There is no guarding or rebound.     Hernia: No  hernia is present.  Skin:    General: Skin is warm and dry.  Neurological:     Mental Status: He is alert and oriented to person, place, and time.    ER notes, ultrasound report reviewed Assessment  Cholelithiasis, asymptomatic Right flank pain musculoskeletal in nature. Plan   I told the patient and son that I did not believe the cholecystectomy is warranted at this point.  They understand this and agree.  His right-sided abdominal pain may be secondary to back issues.  They do report that he occasionally does have back pain.  Literature was given concerning cholelithiasis and biliary colic.  They were told to return should he develop the symptoms.  Follow-up as needed.

## 2020-02-05 NOTE — Patient Instructions (Signed)
Biliary Colic, Adult  Biliary colic is severe pain caused by a problem with a small organ in the upper right part of your belly (gallbladder). The gallbladder stores a digestive fluid produced in the liver (bile) that helps the body break down fat. Bile and other digestive enzymes are carried from the liver to the small intestine through tube-like structures (bile ducts). The gallbladder and the bile ducts form the biliary tract. Sometimes hard deposits of digestive fluids form in the gallbladder (gallstones) and block the flow of bile from the gallbladder, causing biliary colic. This condition is also called a gallbladder attack. Gallstones can be as small as a grain of sand or as big as a golf ball. There could be just one gallstone in the gallbladder, or there could be many. What are the causes? Biliary colic is usually caused by gallstones. Less often, a tumor could block the flow of bile from the gallbladder and trigger biliary colic. What increases the risk? This condition is more likely to develop in:  Women.  People of Hispanic descent.  People with a family history of gallstones.  People who are obese.  People who suddenly or quickly lose weight.  People who eat a high-calorie, low-fiber diet that is rich in refined carbs (carbohydrates), such as white bread and white rice.  People who have an intestinal disease that affects nutrient absorption, such as Crohn disease.  People who have a metabolic condition, such as metabolic syndrome or diabetes. What are the signs or symptoms? Severe pain in the upper right side of the belly is the main symptom of biliary colic. You may feel this pain below the chest but above the hip. This pain often occurs at night or after eating a very fatty meal. This pain may get worse for up to an hour and last as long as 12 hours. In most cases, the pain fades (subsides) within a couple hours. Other symptoms of this condition include:  Nausea and  vomiting.  Pain under the right shoulder. How is this diagnosed? This condition is diagnosed based on your medical history, your symptoms, and a physical exam. You may have tests, including:  Blood tests to rule out infection or inflammation of the bile ducts, gallbladder, pancreas, or liver.  Imaging studies such as: ? Ultrasound. ? CT scan. ? MRI. In some cases, you may need to have an imaging study done using a small amount of radioactive material (nuclear medicine) to confirm the diagnosis. How is this treated? Treatment for this condition may include medicine to relieve your pain or nausea. If you have gallstones that are causing biliary colic, you may need surgery to remove the gallbladder (cholecystectomy). Gallstones can also be dissolved gradually with medicine. It may take months or years before the gallstones are completely gone. Follow these instructions at home:  Take over-the-counter and prescription medicines only as told by your health care provider.  Drink enough fluid to keep your urine clear or pale yellow.  Follow instructions from your health care provider about eating or drinking restrictions. These may include avoiding: ? Fatty, greasy, and fried foods. ? Any foods that make the pain worse. ? Overeating. ? Having a large meal after not eating for a while.  Keep all follow-up visits as told by your health care provider. This is important. How is this prevented? Steps to prevent this condition include:  Maintaining a healthy body weight.  Getting regular exercise.  Eating a healthy, high-fiber, low-fat diet.  Limiting how much   sugar and refined carbs you eat, such as sweets, white flour, and white rice. Contact a health care provider if:  Your pain lasts more than 5 hours.  You vomit.  You have a fever and chills.  Your pain gets worse. Get help right away if:  Your skin or the whites of your eyes look yellow (jaundice).  Your have tea-colored  urine and light-colored stools.  You are dizzy or you faint. Summary  Biliary colic is severe pain caused by a problem with a small organ in the upper right part of your belly (gallbladder).  Treatments for this condition include medicines that relieves your pain or nausea and medicines that slowly dissolves the gallstones.  If gallstones cause your biliary colic, the treatment is surgery to remove the gallbladder (cholecystectomy). This information is not intended to replace advice given to you by your health care provider. Make sure you discuss any questions you have with your health care provider. Document Revised: 09/07/2017 Document Reviewed: 04/10/2016 Elsevier Patient Education  2020 Elsevier Inc. Cholelithiasis  Cholelithiasis is a form of gallbladder disease in which gallstones form in the gallbladder. The gallbladder is an organ that stores bile. Bile is made in the liver, and it helps to digest fats. Gallstones begin as small crystals and slowly grow into stones. They may cause no symptoms until the gallbladder tightens (contracts) and a gallstone is blocking the duct (gallbladder attack), which can cause pain. Cholelithiasis is also referred to as gallstones. There are two main types of gallstones:  Cholesterol stones. These are made of hardened cholesterol and are usually yellow-green in color. They are the most common type of gallstone. Cholesterol is a white, waxy, fat-like substance that is made in the liver.  Pigment stones. These are dark in color and are made of a red-yellow substance that forms when hemoglobin from red blood cells breaks down (bilirubin). What are the causes? This condition may be caused by an imbalance in the substances that bile is made of. This can happen if the bile:  Has too much bilirubin.  Has too much cholesterol.  Does not have enough bile salts. These salts help the body absorb and digest fats. In some cases, this condition can also be  caused by the gallbladder not emptying completely or often enough. What increases the risk? The following factors may make you more likely to develop this condition:  Being male.  Having multiple pregnancies. Health care providers sometimes advise removing diseased gallbladders before future pregnancies.  Eating a diet that is heavy in fried foods, fat, and refined carbohydrates, like white bread and white rice.  Being obese.  Being older than age 40.  Prolonged use of medicines that contain male hormones (estrogen).  Having diabetes mellitus.  Rapidly losing weight.  Having a family history of gallstones.  Being of American Indian or Mexican descent.  Having an intestinal disease such as Crohn disease.  Having metabolic syndrome.  Having cirrhosis.  Having severe types of anemia such as sickle cell anemia. What are the signs or symptoms? In most cases, there are no symptoms. These are known as silent gallstones. If a gallstone blocks the bile ducts, it can cause a gallbladder attack. The main symptom of a gallbladder attack is sudden pain in the upper right abdomen. The pain usually comes at night or after eating a large meal. The pain can last for one or several hours and can spread to the right shoulder or chest. If the bile duct is blocked   for more than a few hours, it can cause infection or inflammation of the gallbladder, liver, or pancreas, which may cause:  Nausea.  Vomiting.  Abdominal pain that lasts for 5 hours or more.  Fever or chills.  Yellowing of the skin or the whites of the eyes (jaundice).  Dark urine.  Light-colored stools. How is this diagnosed? This condition may be diagnosed based on:  A physical exam.  Your medical history.  An ultrasound of your gallbladder.  CT scan.  MRI.  Blood tests to check for signs of infection or inflammation.  A scan of your gallbladder and bile ducts (biliary system) using nonharmful radioactive  material and special cameras that can see the radioactive material (cholescintigram). This test checks to see how your gallbladder contracts and whether bile ducts are blocked.  Inserting a small tube with a camera on the end (endoscope) through your mouth to inspect bile ducts and check for blockages (endoscopic retrograde cholangiopancreatogram). How is this treated? Treatment for gallstones depends on the severity of the condition. Silent gallstones do not need treatment. If the gallstones cause a gallbladder attack or other symptoms, treatment may be required. Options for treatment include:  Surgery to remove the gallbladder (cholecystectomy). This is the most common treatment.  Medicines to dissolve gallstones. These are most effective at treating small gallstones. You may need to take medicines for up to 6-12 months.  Shock wave treatment (extracorporeal biliary lithotripsy). In this treatment, an ultrasound machine sends shock waves to the gallbladder to break gallstones into smaller pieces. These pieces can then be passed into the intestines or be dissolved by medicine. This is rarely used.  Removing gallstones through endoscopic retrograde cholangiopancreatogram. A small basket can be attached to the endoscope and used to capture and remove gallstones. Follow these instructions at home:  Take over-the-counter and prescription medicines only as told by your health care provider.  Maintain a healthy weight and follow a healthy diet. This includes: ? Reducing fatty foods, such as fried food. ? Reducing refined carbohydrates, like white bread and white rice. ? Increasing fiber. Aim for foods like almonds, fruit, and beans.  Keep all follow-up visits as told by your health care provider. This is important. Contact a health care provider if:  You think you have had a gallbladder attack.  You have been diagnosed with silent gallstones and you develop abdominal pain or indigestion. Get  help right away if:  You have pain from a gallbladder attack that lasts for more than 2 hours.  You have abdominal pain that lasts for more than 5 hours.  You have a fever or chills.  You have persistent nausea and vomiting.  You develop jaundice.  You have dark urine or light-colored stools. Summary  Cholelithiasis (also called gallstones) is a form of gallbladder disease in which gallstones form in the gallbladder.  This condition is caused by an imbalance in the substances that make up bile. This can happen if the bile has too much cholesterol, too much bilirubin, or not enough bile salts.  You are more likely to develop this condition if you are male, pregnant, using medicines with estrogen, obese, older than age 40, or have a family history of gallstones. You may also develop gallstones if you have diabetes, an intestinal disease, cirrhosis, or metabolic syndrome.  Treatment for gallstones depends on the severity of the condition. Silent gallstones do not need treatment.  If gallstones cause a gallbladder attack or other symptoms, treatment may be needed. The   most common treatment is surgery to remove the gallbladder. This information is not intended to replace advice given to you by your health care provider. Make sure you discuss any questions you have with your health care provider. Document Revised: 09/07/2017 Document Reviewed: 06/11/2016 Elsevier Patient Education  2020 Elsevier Inc.  

## 2020-02-16 ENCOUNTER — Ambulatory Visit (INDEPENDENT_AMBULATORY_CARE_PROVIDER_SITE_OTHER): Payer: Medicare Other | Admitting: Adult Health

## 2020-02-16 ENCOUNTER — Other Ambulatory Visit: Payer: Self-pay

## 2020-02-16 VITALS — BP 143/81 | HR 70 | Temp 97.5°F | Wt 174.0 lb

## 2020-02-16 DIAGNOSIS — F0281 Dementia in other diseases classified elsewhere with behavioral disturbance: Secondary | ICD-10-CM | POA: Diagnosis not present

## 2020-02-16 DIAGNOSIS — G309 Alzheimer's disease, unspecified: Secondary | ICD-10-CM | POA: Diagnosis not present

## 2020-02-16 MED ORDER — ARIPIPRAZOLE 2 MG PO TABS: 2.0000 mg | ORAL_TABLET | Freq: Every evening | ORAL | 5 refills | Status: DC

## 2020-02-16 MED ORDER — OLANZAPINE 2.5 MG PO TABS: 2.5000 mg | ORAL_TABLET | Freq: Every day | ORAL | 5 refills | Status: DC

## 2020-02-16 NOTE — Progress Notes (Signed)
PATIENT: Ricky [name] DOB: April 29, 1941  REASON FOR VISIT: follow up HISTORY FROM: patient  HISTORY OF PRESENT ILLNESS: Today 02/16/20:  Ricky [name] is a 79 year old male with a history of memory disturbance.  He returns today for follow-up.  His son is with him today.  The son states that his symptoms have worsened.  He is back at home but they have someone with him 24/7.  Son reports that he has hallucinations.  Often reporting that someone is in his home trying to rob him.  Reports that he does not sleep well at night.  He is often up pacing the floors.  States that he takes some naps during the day but nothing excessive.  Son has also noticed a change in his mood.  Reports that he sometimes can get moody.  He was on Seroquel but reports that this made him worse also reports that  It changed his mood and made him mean.  He returns today for an evaluation.  HISTORY 08/05/19:  Ricky [name] is a 79 year old male with a history of memory disturbance.  He returns today for follow-up.  His son accompanies him today.  He returns for sooner visit after son reporting more confusion and panic attacks.  He states that the patient moved in with his son approximately 4 weeks ago.  He states that the first week he was there he did fine.  After that he has been having panic attacks.  His primary care recently increased his Prozac to 40 mg.  He has been taking the increased dose for 1 week.  The patient is able to complete ADLs independently.  He remains on Namenda 5 mg in the morning and 10 mg in the evening.  Returns today for evaluation  REVIEW OF SYSTEMS: Out of a complete 14 system review of symptoms, the patient complains only of the following symptoms, and all other reviewed systems are negative.  See HPI  ALLERGIES: No Known Allergies  HOME MEDICATIONS: Outpatient Medications Prior to Visit  Medication Sig Dispense Refill  . eplerenone (INSPRA) 50 MG tablet Take 50 mg by mouth 2 (two) times daily.     Marland Kitchen FIBER FORMULA PO Take 3 capsules by mouth 2 (two) times daily.    . memantine (NAMENDA) 10 MG tablet TAKE 1 TABLET BY MOUTH TWICE A DAY (Patient taking differently: Take 10 mg by mouth 2 (two) times daily. ) 180 tablet 4  . Potassium Chloride ER 20 MEQ TBCR TAKE 1 TABLET (20MEQ) BY MOUTH TWICE DAILY (Patient taking differently: Take 20 mEq by mouth in the morning and at bedtime. ) 180 tablet 0  . tolterodine (DETROL LA) 4 MG 24 hr capsule Take 4 mg by mouth at bedtime.   10  . busPIRone (BUSPAR) 5 MG tablet TAKE 2 TABLETS (10 MG TOTAL) BY MOUTH 3 (THREE) TIMES DAILY AS NEEDED. (Patient taking differently: Take 5 mg by mouth 2 (two) times daily. ) 540 tablet 1  . FLUoxetine (PROZAC) 40 MG capsule Take 1 capsule (40 mg total) by mouth daily. 90 capsule 3  . QUEtiapine (SEROQUEL) 25 MG tablet Take 1 tablet (25 mg total) by mouth at bedtime. (Patient not taking: Reported on 02/16/2020) 90 tablet 1  . spironolactone (ALDACTONE) 50 MG tablet Take 50 mg by mouth daily.     No facility-administered medications prior to visit.    PAST MEDICAL HISTORY: Past Medical History:  Diagnosis Date  . Chronic cough   . Deviated nasal septum   .  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 Family history of prostate cancer   . GERD (gastroesophageal reflux disease)   . History of adenomatous polyp of colon    2008; 2011  . History of exercise stress test    01-14-2009 by dr hochrein-- normal w/ no ischemia  . Hot flashes    DUE TO HORMONE THERAPY FOR PROSTATE CANCER  . Hyperlipidemia   . Hypertension   . Mild memory disturbance    per dr Jannifer Franklin note (neurologist)  . Nocturia more than twice per night   . Nocturnal leg cramps 03/23/2017  . OA (osteoarthritis)   . OSA (obstructive sleep apnea)    moderate osa per study 02-12-2017  cpap recommended--- per pt never heard back from doctor for obtaining cpap  . Pneumonia 02/2018  . Prostate cancer [facility]) urologist-  dr ottelin/   oncologist-  dr Tammi Klippel   dx 10-10-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, Gleason 3+4, PSA 9.02, vol 78cc started ADT to decrease size;  08/ 2018 prostate decrease size vol 46cc and PSA 0.36--- scheduled for radiative prostate seed implants    PAST SURGICAL HISTORY: Past Surgical History:  Procedure Laterality Date  . CATARACT EXTRACTION W/ INTRAOCULAR LENS  IMPLANT, BILATERAL  2016  . COLONOSCOPY  last one 08-08-2013  . CYSTOSCOPY N/A 08/10/2017   Procedure: CYSTOSCOPY FLEXIBLE;  Surgeon: Kathie Rhodes, MD;  Location[facility];  Service: Urology;  Laterality: N/A;  NO SEEDS FOUND IN BLADDER  . HAND SURGERY Left 1992  . KNEE ARTHROSCOPY Bilateral 1960's and 70's  . PROSTATE BIOPSY  10-10-2016   dr Karsten Ro office  . RADIOACTIVE SEED IMPLANT N/A 08/10/2017   Procedure: RADIOACTIVE SEED IMPLANT/BRACHYTHERAPY IMPLANT;  Surgeon: Kathie Rhodes, MD;  Location: [facility];  Service: Urology;  Laterality: N/A;    64   SEEDS IMPLANTED  . ROTATOR CUFF REPAIR Right 2004  . SPACE OAR INSTILLATION N/A 08/10/2017   Procedure: SPACE OAR INSTILLATION;  Surgeon: Kathie Rhodes, MD;  Location: [facility];  Service: Urology;  Laterality: N/A;  . TOE SURGERY Left 1970   little toe  . TOTAL KNEE ARTHROPLASTY Bilateral 01-25-2009   dr Wynelle Link [facility]    FAMILY HISTORY: Family History  Problem Relation Age of Onset  . Healthy Mother   . Prostate cancer Father        Dx 28s; Deceased 11  . Prostate cancer Brother 5       currently 4  . Alcohol abuse Brother   . Bipolar disorder Brother   . Prostate cancer Paternal Uncle        unsure of age; possibly 2nd uncle also has prostate ca  . Prostate cancer Maternal Uncle        unsure of age  . Breast cancer Maternal Aunt        unsure of age  . Colon cancer Neg Hx   . Esophageal cancer Neg Hx   . Rectal cancer Neg Hx   . Stomach cancer Neg Hx     SOCIAL HISTORY: Social History   Socioeconomic History  . Marital status:  Divorced    Spouse name: [name]  . Number of children: 3  . Years of education: BA  . Highest education level: Not on file  Occupational History  . Occupation: Retired  Tobacco Use  . Smoking status: Never Smoker  . Smokeless tobacco: Never Used  Substance and Sexual Activity  . Alcohol use: Yes  Comment: rare  . Drug use: No  . Sexual activity: Yes    Partners: Female    Comment: vasectomy  Other Topics Concern  . Not on file  Social History Narrative   Lives at home w/ his wife, patient has left his wife and is now staying with his son [name] as of 03/19/2019   Right-handed   Caffeine: none   Social Determinants of Health   Financial Resource Strain:   . Difficulty of Paying Living Expenses:   Food Insecurity:   . Worried About Charity fundraiser in the Last Year:   . Arboriculturist in the Last Year:   Transportation Needs:   . Film/video editor (Medical):   Marland Kitchen Lack of Transportation (Non-Medical):   Physical Activity:   . Days of Exercise per Week:   . Minutes of Exercise per Session:   Stress:   . Feeling of Stress :   Social Connections:   . Frequency of Communication with Friends and Family:   . Frequency of Social Gatherings with Friends and Family:   . Attends Religious Services:   . Active Member of Clubs or Organizations:   . Attends Archivist Meetings:   Marland Kitchen Marital Status:   Intimate Partner Violence:   . Fear of Current or Ex-Partner:   . Emotionally Abused:   Marland Kitchen Physically Abused:   . Sexually Abused:       PHYSICAL EXAM  Vitals:   02/16/20 1455  BP: (!) 143/81  Pulse: 70  Temp: (!) 97.5 F (36.4 C)  Weight: 174 lb (78.9 kg)   Body mass index is 28.51 kg/m.   MMSE - Mini Mental State Exam 02/16/2020 08/05/2019 07/08/2019  Not completed: - - -  Orientation to time 1 1 4   Orientation to Place 1 3 3   Registration 3 3 3   Attention/ Calculation 0 0 0  Recall 0 2 2  Language- name 2 objects 2 2 2   Language- repeat 0 1 1   Language- follow 3 step command 1 3 3   Language- follow 3 step command-comments - - -  Language- read & follow direction 0 1 1  Write a sentence 0 1 1  Copy design 0 0 1  Total score 8 17 21      Generalized: Well developed, in no acute distress   Neurological examination  Mentation: Alert to person.  Follows all commands intermittently.  Speech is fluent but often his conversation is not related to the topic at hand Cranial nerve II-XII: Pupils were equal round reactive to light. Extraocular movements were full, visual field were full on confrontational test.  Head turning and shoulder shrug  were normal and symmetric. Motor: The motor testing reveals 5 over 5 strength of all 4 extremities. Good symmetric motor tone is noted throughout.  Sensory: Sensory testing is intact to soft touch on all 4 extremities. No evidence of extinction is noted.  Coordination: Cerebellar testing reveals good finger-nose-finger and heel-to-shin bilaterally.  But directions had to be repeated Gait and station: Gait is unsteady.  He has a shuffling gait.  Tandem gait not attempted   DIAGNOSTIC DATA (LABS, IMAGING, TESTING) - I reviewed patient records, labs, notes, testing and imaging myself where available.  Lab Results  Component Value Date   WBC 4.4 01/23/2020   HGB 12.8 (L) 01/23/2020   HCT 38.5 (L) 01/23/2020   MCV 86.9 01/23/2020   PLT 187 01/23/2020      Component Value Date/Time  NA 138 01/23/2020 1257   NA 146 (H) 10/16/2019 1143   K 4.1 01/23/2020 1257   CL 107 01/23/2020 1257   CO2 24 01/23/2020 1257   GLUCOSE 90 01/23/2020 1257   BUN 30 (H) 01/23/2020 1257   BUN 13 10/16/2019 1143   CREATININE 1.33 (H) 01/23/2020 1257   CALCIUM 9.4 01/23/2020 1257   PROT 6.0 (L) 01/23/2020 1257   PROT 6.2 08/05/2019 1158   ALBUMIN 3.9 01/23/2020 1257   ALBUMIN 4.3 08/05/2019 1158   AST 17 01/23/2020 1257   ALT 16 01/23/2020 1257   ALKPHOS 75 01/23/2020 1257   BILITOT 0.7 01/23/2020 1257    BILITOT 0.5 08/05/2019 1158   GFRNONAA 51 (L) 01/23/2020 1257   GFRAA 59 (L) 01/23/2020 1257   Lab Results  Component Value Date   CHOL 173 07/08/2019   HDL 44 07/08/2019   LDLCALC 109 (H) 07/08/2019   TRIG 107 07/08/2019   CHOLHDL 3.9 07/08/2019   No results found for: HGBA1C Lab Results  Component Value Date   VITAMINB12 313 05/15/2019   Lab Results  Component Value Date   TSH 0.950 01/23/2020      ASSESSMENT AND PLAN 79 y.o. year old male  has a past medical history of Chronic cough, Deviated nasal septum, Enlarged prostate with lower urinary tract symptoms (LUTS), Family history of prostate cancer, GERD (gastroesophageal reflux disease), History of adenomatous polyp of colon, History of exercise stress test, Hot flashes, Hyperlipidemia, Hypertension, Mild memory disturbance, Nocturia more than twice per night, Nocturnal leg cramps (03/23/2017), OA (osteoarthritis), OSA (obstructive sleep apnea), Pneumonia (02/2018), and Prostate cancer [facility]) (urologist-  dr ottelin/  oncologist-  dr Tammi Klippel). here with :  1.  Alzheimer's disease  -Continue Namenda - MMSE 8/30 score has declined  2.  Hallucinations  -Discussed with Dr. Jannifer Franklin will start on Abilify 2 mg daily around 6 PM -Tried Seroquel but made symptoms worse  Advised if symptoms worsen or he develops new symptoms he should let us know follow-up in 6 months or sooner if needed   I spent 30 minutes of face-to-face and non-face-to-face time with patient.  This included previsit chart review, lab review, study review, order entry, electronic health record documentation, patient education.  Ward Givens, MSN, NP-C 02/16/2020, 3:07 PM [facility] [address] [place], [place] [address] [phone]

## 2020-02-16 NOTE — Progress Notes (Signed)
I have read the note, and I agree with the clinical assessment and plan.  Lariza Cothron K Ifeanyichukwu Wickham   

## 2020-02-16 NOTE — Patient Instructions (Addendum)
Your Plan:  Continue Namenda Start Abilify 2 mg at bedtime   Thank you for coming to see [place] at [facility]. I hope we have been able to provide you high quality care today.  You may receive a patient satisfaction survey over the next few weeks. We would appreciate your feedback and comments so that we may continue to improve ourselves and the health of our patients.  Aripiprazole tablets What is this medicine? ARIPIPRAZOLE (ay ri PIP ray zole) is an atypical antipsychotic. It is used to treat schizophrenia and bipolar disorder, also known as manic-depression. It is also used to treat Tourette's disorder and some symptoms of autism. This medicine may also be used in combination with antidepressants to treat major depressive disorder. This medicine may be used for other purposes; ask your health care provider or pharmacist if you have questions. COMMON BRAND NAME(S): Abilify What should I tell my health care provider before I take this medicine? They need to know if you have any of these conditions:  dehydration  dementia  diabetes  heart disease  history of stroke  low blood counts, like low white cell, platelet, or red cell counts  Parkinson's disease  seizures  suicidal thoughts, plans, or attempt; a previous suicide attempt by you or a family member  an unusual or allergic reaction to aripiprazole, other medicines, foods, dyes, or preservatives  pregnant or trying to get pregnant  breast-feeding How should I use this medicine? Take this medicine by mouth with a glass of water. Follow the directions on the prescription label. You can take this medicine with or without food. Take your doses at regular intervals. Do not take your medicine more often than directed. Do not stop taking except on the advice of your doctor or health care professional. A special MedGuide will be given to you by the pharmacist with each prescription and refill. Be sure to read  this information carefully each time. Talk to your pediatrician regarding the use of this medicine in children. While this drug may be prescribed for children as young as 34 years of age for selected conditions, precautions do apply. Overdosage: If you think you have taken too much of this medicine contact a poison control center or emergency room at once. NOTE: This medicine is only for you. Do not share this medicine with others. What if I miss a dose? If you miss a dose, take it as soon as you can. If it is almost time for your next dose, take only that dose. Do not take double or extra doses. What may interact with this medicine? Do not take this medicine with any of the following medications:  brexpiprazole  cisapride  dronedarone  metoclopramide  pimozide  thioridazine This medicine may also interact with the following medications:  alcohol  carbamazepine  certain medicines for anxiety or sleep  certain medicines for blood pressure  certain medicines for fungal infections like ketoconazole, fluconazole, posaconazole, and itraconazole  clarithromycin  dofetilide  fluoxetine  other medicines that prolong the QT interval (cause an abnormal heart rhythm)  paroxetine  quinidine  rifampin  ziprasidone This list may not describe all possible interactions. Give your health care provider a list of all the medicines, herbs, non-prescription drugs, or dietary supplements you use. Also tell them if you smoke, drink alcohol, or use illegal drugs. Some items may interact with your medicine. What should I watch for while using this medicine? Visit your health care professional for regular checks on your  progress. Tell your health care professional if symptoms do not start to get better or if they get worse. Do not stop taking except on your health care professional's advice. You may develop a severe reaction. Your health care professional will tell you how much medicine to  take. Patients and their families should watch out for new or worsening depression or thoughts of suicide. Also watch out for sudden changes in feelings such as feeling anxious, agitated, panicky, irritable, hostile, aggressive, impulsive, severely restless, overly excited and hyperactive, or not being able to sleep. If this happens, especially at the beginning of antidepressant treatment or after a change in dose, call your health care professional. Dennis Bast may get dizzy or drowsy. Do not drive, use machinery, or do anything that needs mental alertness until you know how this medicine affects you. Do not stand or sit up quickly, especially if you are an older patient. This reduces the risk of dizzy or fainting spells. Alcohol may interfere with the effect of this medicine. Avoid alcoholic drinks. This drug can cause problems with controlling your body temperature. It can lower the response of your body to cold temperatures. If possible, stay indoors during cold weather. If you must go outdoors, wear warm clothes. It can also lower the response of your body to heat. Do not overheat. Do not over-exercise. Stay out of the sun when possible. If you must be in the sun, wear cool clothing. Drink plenty of water. If you have trouble controlling your body temperature, call your health care provider right away. This medicine may cause dry eyes and blurred vision. If you wear contact lenses, you may feel some discomfort. Lubricating drops may help. See your eye doctor if the problem does not go away or is severe. This medicine may increase blood sugar. Ask your health care provider if changes in diet or medicines are needed if you have diabetes. There are have been reports of increased sexual urges or other strong urges such as gambling while taking this medicine. If you experience any of these while taking this medicine, you should report this to your health care professional as soon as possible. What side effects may I  notice from receiving this medicine? Side effects that you should report to your doctor or health care professional as soon as possible:  allergic reactions like skin rash, itching or hives, swelling of the face, lips, or tongue  breathing problems  confusion  fast, irregular heartbeat  fever or chills, sore throat  inability to keep still  males: prolonged or painful erection  new or increased gambling urges, sexual urges, uncontrolled spending, binge or compulsive eating, or other urges  problems with balance, talking, walking  seizures  signs and symptoms of high blood sugar such as being more thirsty or hungry or having to urinate more than normal. You may also feel very tired or have blurry vision  signs and symptoms of low blood pressure like dizziness; feeling faint or lightheaded, falls; unusually weak or tired  signs and symptoms of neuroleptic malignant syndrome like confusion; fast or irregular heartbeat; high fever; increased sweating; stiff muscles  sudden numbness or weakness of the face, arm, or leg  suicidal thoughts or other mood changes  trouble swallowing  uncontrollable movements of the arms, face, head, mouth, neck, or upper body Side effects that usually do not require medical attention (report to your doctor or health care professional if they continue or are bothersome):  constipation  headache  nausea, vomiting  trouble sleeping  weight gain This list may not describe all possible side effects. Call your doctor for medical advice about side effects. You may report side effects to FDA at 1-800-FDA-1088. Where should I keep my medicine? Keep out of the reach of children. Store at room temperature between 15 and 30 degrees C (59 and 86 degrees F). Throw away any unused medicine after the expiration date. NOTE: This sheet is a summary. It may not cover all possible information. If you have questions about this medicine, talk to your doctor,  pharmacist, or health care provider.  2020 Elsevier/Gold Standard (2019-07-22 16:17:22)

## 2020-02-16 NOTE — Progress Notes (Addendum)
I called pharmacy and spoke to Griggs and relayed that zyrexa prescription that was escribed today to be cancelled.  She will cancel.  I called and relayed to son of pt that MM/NP did send in Abilify 2mg  po every evening for hallucinations.  Called in to pharmacy.  He verbalized understanding.

## 2020-02-23 ENCOUNTER — Other Ambulatory Visit: Payer: Self-pay | Admitting: *Deleted

## 2020-02-23 DIAGNOSIS — F0390 Unspecified dementia without behavioral disturbance: Secondary | ICD-10-CM

## 2020-02-23 MED ORDER — BUSPIRONE HCL 5 MG PO TABS: 10.0000 mg | ORAL_TABLET | Freq: Three times a day (TID) | ORAL | 0 refills | Status: DC | PRN

## 2020-03-20 ENCOUNTER — Other Ambulatory Visit: Payer: Self-pay | Admitting: Adult Health

## 2020-03-20 DIAGNOSIS — F0281 Dementia in other diseases classified elsewhere with behavioral disturbance: Secondary | ICD-10-CM

## 2020-03-20 DIAGNOSIS — G309 Alzheimer's disease, unspecified: Secondary | ICD-10-CM

## 2020-03-24 NOTE — Progress Notes (Signed)
PA for ARIPiprazole 2mg  tab has been approved from 03/24/2020- 03/24/2021. --- When I was speaking to [facility] says the PA was not completed n Cover my meds because the name on the insurance card and in the pts chart are different.  I contacted Ricky [name] is a 79 y.o. 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 I was told to notify the patient so the chart and card match. [employer] says future PA's may be denied due to the name not matching.  Son Ricky [name] is on the [facility]. Marland Kitchen

## 2020-07-26 DIAGNOSIS — N1832 Chronic kidney disease, stage 3b: Secondary | ICD-10-CM | POA: Diagnosis not present

## 2020-08-03 DIAGNOSIS — E269 Hyperaldosteronism, unspecified: Secondary | ICD-10-CM | POA: Diagnosis not present

## 2020-08-03 DIAGNOSIS — N1832 Chronic kidney disease, stage 3b: Secondary | ICD-10-CM | POA: Diagnosis not present

## 2020-08-03 DIAGNOSIS — E876 Hypokalemia: Secondary | ICD-10-CM | POA: Diagnosis not present

## 2020-08-03 DIAGNOSIS — I129 Hypertensive chronic kidney disease with stage 1 through stage 4 chronic kidney disease, or unspecified chronic kidney disease: Secondary | ICD-10-CM | POA: Diagnosis not present

## 2020-08-09 DIAGNOSIS — N3941 Urge incontinence: Secondary | ICD-10-CM | POA: Diagnosis not present

## 2020-08-09 DIAGNOSIS — C61 Malignant neoplasm of prostate: Secondary | ICD-10-CM | POA: Diagnosis not present

## 2020-08-17 NOTE — Progress Notes (Deleted)
PATIENT: Ricky [name] DOB: Mar 19, 1941  REASON FOR VISIT: follow up HISTORY FROM: patient  HISTORY OF PRESENT ILLNESS: Today 08/17/20 Started on abilify 2 mg at [facility] at the last visit, continued namenda HISTORY 02/16/20:  Ricky [name] is a 79 year old male with a history of memory disturbance.  He returns today for follow-up.  His son is with him today.  The son states that his symptoms have worsened.  He is back at home but they have someone with him 24/7.  Son reports that he has hallucinations.  Often reporting that someone is in his home trying to rob him.  Reports that he does not sleep well at night.  He is often up pacing the floors.  States that he takes some naps during the day but nothing excessive.  Son has also noticed a change in his mood.  Reports that he sometimes can get moody.  He was on Seroquel but reports that this made him worse also reports that  It changed his mood and made him mean.  He returns today for an evaluation  REVIEW OF SYSTEMS: Out of a complete 14 system review of symptoms, the patient complains only of the following symptoms, and all other reviewed systems are negative.  ALLERGIES: No Known Allergies  HOME MEDICATIONS: Outpatient Medications Prior to Visit  Medication Sig Dispense Refill  . ARIPiprazole (ABILIFY) 2 MG tablet TAKE 1 TABLET (2 MG TOTAL) BY MOUTH EVERY EVENING. 90 tablet 2  . busPIRone (BUSPAR) 5 MG tablet Take 2 tablets (10 mg total) by mouth 3 (three) times daily as needed. 540 tablet 0  . eplerenone (INSPRA) 50 MG tablet Take 50 mg by mouth 2 (two) times daily.    Marland Kitchen FIBER FORMULA PO Take 3 capsules by mouth 2 (two) times daily.    Marland Kitchen FLUoxetine (PROZAC) 40 MG capsule Take 1 capsule (40 mg total) by mouth daily. 90 capsule 3  . memantine (NAMENDA) 10 MG tablet TAKE 1 TABLET BY MOUTH TWICE A DAY (Patient taking differently: Take 10 mg by mouth 2 (two) times daily. ) 180 tablet 4  . OLANZapine (ZYPREXA) 2.5 MG tablet Take 1 tablet (2.5 mg  total) by mouth at bedtime. 30 tablet 5  . Potassium Chloride ER 20 MEQ TBCR TAKE 1 TABLET (20MEQ) BY MOUTH TWICE DAILY (Patient taking differently: Take 20 mEq by mouth in the morning and at bedtime. ) 180 tablet 0  . spironolactone (ALDACTONE) 50 MG tablet Take 50 mg by mouth daily.    Marland Kitchen tolterodine (DETROL LA) 4 MG 24 hr capsule Take 4 mg by mouth at bedtime.   10   No facility-administered medications prior to visit.    PAST MEDICAL HISTORY: Past Medical History:  Diagnosis Date  . Chronic cough   . Deviated nasal septum   . 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 Family history of prostate cancer   . GERD (gastroesophageal reflux disease)   . History of adenomatous polyp of colon    2008; 2011  . History of exercise stress test    01-14-2009 by dr hochrein-- normal w/ no ischemia  . Hot flashes    DUE TO HORMONE THERAPY FOR PROSTATE CANCER  . Hyperlipidemia   . Hypertension   . Mild memory disturbance    per dr Jannifer Franklin note (neurologist)  . Nocturia more than twice per night   . Nocturnal leg cramps 03/23/2017  . OA (osteoarthritis)   . OSA (obstructive sleep apnea)  moderate osa per study 02-12-2017  cpap recommended--- per pt never heard back from doctor for obtaining cpap  . Pneumonia 02/2018  . 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 CATARACT EXTRACTION W/ INTRAOCULAR LENS  IMPLANT, BILATERAL  2016  . COLONOSCOPY  last one 08-08-2013  . CYSTOSCOPY N/A 08/10/2017   Procedure: CYSTOSCOPY FLEXIBLE;  Surgeon: Kathie Rhodes, MD;  Location: [facility];  Service: Urology;  Laterality: N/A;  NO SEEDS FOUND IN BLADDER  . HAND SURGERY  Left 1992  . KNEE ARTHROSCOPY Bilateral 1960's and 70's  . PROSTATE BIOPSY  10-10-2016   dr Karsten Ro office  . RADIOACTIVE SEED IMPLANT N/A 08/10/2017   Procedure: RADIOACTIVE SEED IMPLANT/BRACHYTHERAPY IMPLANT;  Surgeon: Kathie Rhodes, MD;  Location: [facility];  Service: Urology;  Laterality: N/A;    64   SEEDS IMPLANTED  . ROTATOR CUFF REPAIR Right 2004  . SPACE OAR INSTILLATION N/A 08/10/2017   Procedure: SPACE OAR INSTILLATION;  Surgeon: Kathie Rhodes, MD;  Location: [facility];  Service: Urology;  Laterality: N/A;  . TOE SURGERY Left 1970   little toe  . TOTAL KNEE ARTHROPLASTY Bilateral 01-25-2009   dr Wynelle Link [facility]    FAMILY HISTORY: Family History  Problem Relation Age of Onset  . Healthy Mother   . Prostate cancer Father        Dx 30s; Deceased 23  . Prostate cancer Brother 59       currently 107  . Alcohol abuse Brother   . Bipolar disorder Brother   . Prostate cancer Paternal Uncle        unsure of age; possibly 2nd uncle also has prostate ca  . Prostate cancer Maternal Uncle        unsure of age  . Breast cancer Maternal Aunt        unsure of age  . Colon cancer Neg Hx   . Esophageal cancer Neg Hx   . Rectal cancer Neg Hx   . Stomach cancer Neg Hx     SOCIAL HISTORY: Social History   Socioeconomic History  . Marital status: Divorced    Spouse name: Ricky [name]  . Number of children: 3  . Years of education: BA  . Highest education level: Not on file  Occupational History  . Occupation: Retired  Tobacco Use  . Smoking status: Never Smoker  . Smokeless tobacco: Never Used  Vaping Use  . Vaping Use: Never used  Substance and Sexual Activity  . Alcohol use: Yes    Comment: rare  . Drug use: No  . Sexual activity: Yes    Partners: Female    Comment: vasectomy  Other Topics Concern  . Not on file  Social History Narrative   Lives at home w/ his wife, patient has left his wife and is now staying with his son Ricky [name] as of 03/19/2019    Right-handed   Caffeine: none   Social Determinants of Health   Financial Resource Strain:   . Difficulty of Paying Living Expenses: Not on file  Food Insecurity:   . Worried About Charity fundraiser in the Last Year: Not on file  . Ran  Out of Food in the Last Year: Not on file  Transportation Needs:   . Lack of Transportation (Medical): Not on file  . Lack of Transportation (Non-Medical): Not on file  Physical Activity:   . Days of Exercise per Week: Not on file  . Minutes of Exercise per Session: Not on file  Stress:   . Feeling of Stress : Not on file  Social Connections:   . Frequency of Communication with Friends and Family: Not on file  . Frequency of Social Gatherings with Friends and Family: Not on file  . Attends Religious Services: Not on file  . Active Member of Clubs or Organizations: Not on file  . Attends Archivist Meetings: Not on file  . Marital Status: Not on file  Intimate Partner Violence:   . Fear of Current or Ex-Partner: Not on file  . Emotionally Abused: Not on file  . Physically Abused: Not on file  . Sexually Abused: Not on file      PHYSICAL EXAM  There were no vitals filed for this visit. There is no height or weight on file to calculate BMI.  Generalized: Well developed, in no acute distress   Neurological examination  Mentation: Alert oriented to time, place, history taking. Follows all commands speech and language fluent Cranial nerve II-XII: Pupils were equal round reactive to light. Extraocular movements were full, visual field were full on confrontational test. Facial sensation and strength were normal. Uvula tongue midline. Head turning and shoulder shrug  were normal and symmetric. Motor: The motor testing reveals 5 over 5 strength of all 4 extremities. Good symmetric motor tone is noted throughout.  Sensory: Sensory testing is intact to soft touch on all 4 extremities. No evidence of extinction is noted.  Coordination:  Cerebellar testing reveals good finger-nose-finger and heel-to-shin bilaterally.  Gait and station: Gait is normal. Tandem gait is normal. Romberg is negative. No drift is seen.  Reflexes: Deep tendon reflexes are symmetric and normal bilaterally.   DIAGNOSTIC DATA (LABS, IMAGING, TESTING) - I reviewed patient records, labs, notes, testing and imaging myself where available.  Lab Results  Component Value Date   WBC 4.4 01/23/2020   HGB 12.8 (L) 01/23/2020   HCT 38.5 (L) 01/23/2020   MCV 86.9 01/23/2020   PLT 187 01/23/2020      Component Value Date/Time   NA 138 01/23/2020 1257   NA 146 (H) 10/16/2019 1143   K 4.1 01/23/2020 1257   CL 107 01/23/2020 1257   CO2 24 01/23/2020 1257   GLUCOSE 90 01/23/2020 1257   BUN 30 (H) 01/23/2020 1257   BUN 13 10/16/2019 1143   CREATININE 1.33 (H) 01/23/2020 1257   CALCIUM 9.4 01/23/2020 1257   PROT 6.0 (L) 01/23/2020 1257   PROT 6.2 08/05/2019 1158   ALBUMIN 3.9 01/23/2020 1257   ALBUMIN 4.3 08/05/2019 1158   AST 17 01/23/2020 1257   ALT 16 01/23/2020 1257   ALKPHOS 75 01/23/2020 1257   BILITOT 0.7 01/23/2020 1257   BILITOT 0.5 08/05/2019 1158   GFRNONAA 51 (L) 01/23/2020 1257   GFRAA 59 (L) 01/23/2020 1257   Lab Results  Component Value Date   CHOL 173 07/08/2019   HDL 44 07/08/2019   LDLCALC 109 (H) 07/08/2019   TRIG 107 07/08/2019   CHOLHDL 3.9 07/08/2019   No results found for: HGBA1C Lab Results  Component Value Date   VITAMINB12 313 05/15/2019   Lab Results  Component Value Date   TSH 0.950  01/23/2020      ASSESSMENT AND PLAN 79 y.o. 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 here with ***   I spent *** minutes of face-to-face and non-face-to-face time with patient.  This included previsit chart review, lab review, study review, order entry, electronic health record documentation, patient education.  Ward Givens, MSN, NP-C 08/17/2020, 9:32 AM [facility] [address] [place], [place] [address] [phone]

## 2020-08-18 ENCOUNTER — Ambulatory Visit: Payer: Medicare Other | Admitting: Adult Health

## 2020-08-18 ENCOUNTER — Encounter: Payer: Self-pay | Admitting: Adult Health

## 2020-08-18 VITALS — BP 120/72 | HR 85 | Ht 65.5 in | Wt 171.2 lb

## 2020-08-18 DIAGNOSIS — F0281 Dementia in other diseases classified elsewhere with behavioral disturbance: Secondary | ICD-10-CM

## 2020-08-18 DIAGNOSIS — G309 Alzheimer's disease, unspecified: Secondary | ICD-10-CM

## 2020-08-18 NOTE — Progress Notes (Signed)
I have read the note, and I agree with the clinical assessment and plan.  Burnis Kaser K Ceniyah Thorp   

## 2020-08-18 NOTE — Progress Notes (Signed)
PATIENT: Ricky [name] DOB: 1941-05-23  REASON FOR VISIT: follow up HISTORY FROM: patient  HISTORY OF PRESENT ILLNESS: Today 08/18/20:  [name] is a 79 year old male with a history of memory disturbance.  He returns today for follow-up.  He is currently on Namenda 10 mg twice a day.  He is here today with his ex-wife.  She has become his caregiver.  She is the mother of his children.  He has aids with him 24 hours a day.  He requires assistance with all ADLs.  She reports that he has a good appetite.  She states that he tends to sleep a lot during the day and is up at night.  At the last visit Abilify was started for his behavior and they have found this beneficial.  She does note that sometimes they have a hard time getting him to take his medications.  HISTORY 02/16/20:  [name] is a 79 year old male with a history of memory disturbance.  He returns today for follow-up.  His son is with him today.  The son states that his symptoms have worsened.  He is back at home but they have someone with him 24/7.  Son reports that he has hallucinations.  Often reporting that someone is in his home trying to rob him.  Reports that he does not sleep well at night.  He is often up pacing the floors.  States that he takes some naps during the day but nothing excessive.  Son has also noticed a change in his mood.  Reports that he sometimes can get moody.  He was on Seroquel but reports that this made him worse also reports that  It changed his mood and made him mean.  He returns today for an evaluation.  REVIEW OF SYSTEMS: Out of a complete 14 system review of symptoms, the patient complains only of the following symptoms, and all other reviewed systems are negative.  See HPI  ALLERGIES: No Known Allergies  HOME MEDICATIONS: Outpatient Medications Prior to Visit  Medication Sig Dispense Refill  . ARIPiprazole (ABILIFY) 2 MG tablet TAKE 1 TABLET (2 MG TOTAL) BY MOUTH EVERY EVENING. 90 tablet 2  .  busPIRone (BUSPAR) 5 MG tablet Take 2 tablets (10 mg total) by mouth 3 (three) times daily as needed. 540 tablet 0  . eplerenone (INSPRA) 50 MG tablet Take 50 mg by mouth 2 (two) times daily.    Marland Kitchen FIBER FORMULA PO Take 3 capsules by mouth 2 (two) times daily.    Marland Kitchen FLUoxetine (PROZAC) 40 MG capsule Take 1 capsule (40 mg total) by mouth daily. 90 capsule 3  . memantine (NAMENDA) 10 MG tablet TAKE 1 TABLET BY MOUTH TWICE A DAY (Patient taking differently: Take 10 mg by mouth 2 (two) times daily. ) 180 tablet 4  . OLANZapine (ZYPREXA) 2.5 MG tablet Take 1 tablet (2.5 mg total) by mouth at bedtime. 30 tablet 5  . spironolactone (ALDACTONE) 50 MG tablet Take 50 mg by mouth daily.    Marland Kitchen tolterodine (DETROL LA) 4 MG 24 hr capsule Take 4 mg by mouth at bedtime.   10  . Potassium Chloride ER 20 MEQ TBCR TAKE 1 TABLET (20MEQ) BY MOUTH TWICE DAILY (Patient taking differently: Take 20 mEq by mouth in the morning and at bedtime. ) 180 tablet 0   No facility-administered medications prior to visit.    PAST MEDICAL HISTORY: Past Medical History:  Diagnosis Date  . Chronic cough   . Deviated nasal  septum   . 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 Family history of prostate cancer   . GERD (gastroesophageal reflux disease)   . History of adenomatous polyp of colon    2008; 2011  . History of exercise stress test    01-14-2009 by dr hochrein-- normal w/ no ischemia  . Hot flashes    DUE TO HORMONE THERAPY FOR PROSTATE CANCER  . Hyperlipidemia   . Hypertension   . Mild memory disturbance    per dr Jannifer Franklin note (neurologist)  . Nocturia more than twice per night   . Nocturnal leg cramps 03/23/2017  . OA (osteoarthritis)   . OSA (obstructive sleep apnea)    moderate osa per study 02-12-2017  cpap recommended--- per pt never heard back from doctor for obtaining cpap  . Pneumonia 02/2018  . Prostate cancer [facility]) urologist-  dr ottelin/  oncologist-  dr Tammi Klippel   dx 10-10-2016   Stage T1c, Gleason 3+4, PSA 9.02, vol 78cc started ADT to decrease size;  08/ 2018 prostate decrease size vol 46cc and PSA 0.36--- scheduled for radiative prostate seed implants    PAST SURGICAL HISTORY: Past Surgical History:  Procedure Laterality Date  . CATARACT EXTRACTION W/ INTRAOCULAR LENS  IMPLANT, BILATERAL  2016  . COLONOSCOPY  last one 08-08-2013  . CYSTOSCOPY N/A 08/10/2017   Procedure: CYSTOSCOPY FLEXIBLE;  Surgeon: Kathie Rhodes, MD;  Location: [facility]  Service: Urology;  Laterality: N/A;  NO SEEDS FOUND IN BLADDER  . HAND SURGERY Left 1992  . KNEE ARTHROSCOPY Bilateral 1960's and 70's  . PROSTATE BIOPSY  10-10-2016   dr Karsten Ro office  . RADIOACTIVE SEED IMPLANT N/A 08/10/2017   Procedure: RADIOACTIVE SEED IMPLANT/BRACHYTHERAPY IMPLANT;  Surgeon: Kathie Rhodes, MD;  Location[facility]  Service: Urology;  Laterality: N/A;    64   SEEDS IMPLANTED  . ROTATOR CUFF REPAIR Right 2004  . SPACE OAR INSTILLATION N/A 08/10/2017   Procedure: SPACE OAR INSTILLATION;  Surgeon: Kathie Rhodes, MD;  [facility];  Service: Urology;  Laterality: N/A;  . TOE SURGERY Left 1970   little toe  . TOTAL KNEE ARTHROPLASTY Bilateral 01-25-2009   dr Wynelle Link [facility]    FAMILY HISTORY: Family History  Problem Relation Age of Onset  . Healthy Mother   . Prostate cancer Father        Dx 58s; Deceased 21  . Prostate cancer Brother 64       currently 1  . Alcohol abuse Brother   . Bipolar disorder Brother   . Prostate cancer Paternal Uncle        unsure of age; possibly 2nd uncle also has prostate ca  . Prostate cancer Maternal Uncle        unsure of age  . Breast cancer Maternal Aunt        unsure of age  . Colon cancer Neg Hx   . Esophageal cancer Neg Hx   . Rectal cancer Neg Hx   . Stomach cancer Neg Hx     SOCIAL HISTORY: Social History   Socioeconomic History  . Marital status: Divorced    Spouse name: Ricky [name]  . Number of  children: 3  . Years of education: BA  . Highest education level: Not on file  Occupational History  . Occupation: Retired  Tobacco Use  . Smoking status: Never Smoker  . Smokeless tobacco: Never Used  Vaping Use  . Vaping Use: Never used  Substance and Sexual Activity  . Alcohol use: Yes    Comment: rare  . Drug use: No  . Sexual activity: Yes    Partners: Female    Comment: vasectomy  Other Topics Concern  . Not on file  Social History Narrative   Lives at home w/ his wife, patient has left his wife and is now staying with his son [name] as of 03/19/2019   Right-handed   Caffeine: none   Social Determinants of Health   Financial Resource Strain:   . Difficulty of Paying Living Expenses: Not on file  Food Insecurity:   . Worried About Charity fundraiser in the Last Year: Not on file  . Ran Out of Food in the Last Year: Not on file  Transportation Needs:   . Lack of Transportation (Medical): Not on file  . Lack of Transportation (Non-Medical): Not on file  Physical Activity:   . Days of Exercise per Week: Not on file  . Minutes of Exercise per Session: Not on file  Stress:   . Feeling of Stress : Not on file  Social Connections:   . Frequency of Communication with Friends and Family: Not on file  . Frequency of Social Gatherings with Friends and Family: Not on file  . Attends Religious Services: Not on file  . Active Member of Clubs or Organizations: Not on file  . Attends Archivist Meetings: Not on file  . Marital Status: Not on file  Intimate Partner Violence:   . Fear of Current or Ex-Partner: Not on file  . Emotionally Abused: Not on file  . Physically Abused: Not on file  . Sexually Abused: Not on file      PHYSICAL EXAM  Vitals:   08/18/20 0915  BP: 120/72  Pulse: 85  Weight: 171 lb 3.2 oz (77.7 kg)  Height: 5' 5.5" (1.664 m)   Body mass index is 28.06 kg/m.   MMSE - Mini Mental State Exam 08/18/2020 02/16/2020 08/05/2019  Not  completed: - - -  Orientation to time 0 1 1  Orientation to Place 1 1 3   Registration 3 3 3   Attention/ Calculation 0 0 0  Recall 0 0 2  Language- name 2 objects 0 2 2  Language- repeat 0 0 1  Language- follow 3 step command 1 1 3   Language- follow 3 step command-comments - - -  Language- read & follow direction 0 0 1  Write a sentence 0 0 1  Copy design 0 0 0  Total score 5 8 17      Generalized: Well developed, in no acute distress   Neurological examination  Mentation: Alert, oriented to person.. Follows all commands speech and language fluent Cranial nerve II-XII: Pupils were equal round reactive to light. Extraocular movements were full, visual field were full on confrontational test.  Head turning and shoulder shrug  were normal and symmetric. Motor: The motor testing reveals 5 over 5 strength of all 4 extremities. Good symmetric motor tone is noted throughout.  Sensory: Sensory testing is intact to soft touch on all 4 extremities. No evidence of extinction is noted.  Coordination: Cerebellar testing reveals good finger-nose-finger and heel-to-shin bilaterally.  Gait and station: Gait is unsteady.  He has a shuffling gait.  Good arm swing.  4-5 steps with turns Reflexes: Deep tendon reflexes are symmetric and normal bilaterally.   DIAGNOSTIC DATA (LABS, IMAGING, TESTING) - I reviewed patient records, labs, notes, testing and imaging myself where available.  Lab Results  Component Value Date   WBC 4.4 01/23/2020   HGB 12.8 (L) 01/23/2020   HCT 38.5 (L) 01/23/2020   MCV 86.9 01/23/2020   PLT 187 01/23/2020      Component Value Date/Time   NA 138 01/23/2020 1257   NA 146 (H) 10/16/2019 1143   K 4.1 01/23/2020 1257   CL 107 01/23/2020 1257   CO2 24 01/23/2020 1257   GLUCOSE 90 01/23/2020 1257   BUN 30 (H) 01/23/2020 1257   BUN 13 10/16/2019 1143   CREATININE 1.33 (H) 01/23/2020 1257   CALCIUM 9.4 01/23/2020 1257   PROT 6.0 (L) 01/23/2020 1257   PROT 6.2 08/05/2019  1158   ALBUMIN 3.9 01/23/2020 1257   ALBUMIN 4.3 08/05/2019 1158   AST 17 01/23/2020 1257   ALT 16 01/23/2020 1257   ALKPHOS 75 01/23/2020 1257   BILITOT 0.7 01/23/2020 1257   BILITOT 0.5 08/05/2019 1158   GFRNONAA 51 (L) 01/23/2020 1257   GFRAA 59 (L) 01/23/2020 1257   Lab Results  Component Value Date   CHOL 173 07/08/2019   HDL 44 07/08/2019   LDLCALC 109 (H) 07/08/2019   TRIG 107 07/08/2019   CHOLHDL 3.9 07/08/2019   No results found for: HGBA1C Lab Results  Component Value Date   VITAMINB12 313 05/15/2019   Lab Results  Component Value Date   TSH 0.950 01/23/2020      ASSESSMENT AND PLAN 79 y.o. year old male  has a past medical history of Chronic cough, Deviated nasal septum, Enlarged prostate with lower urinary tract symptoms (LUTS), Family history of prostate cancer, GERD (gastroesophageal reflux disease), History of adenomatous polyp of colon, History of exercise stress test, Hot flashes, Hyperlipidemia, Hypertension, Mild memory disturbance, Nocturia more than twice per night, Nocturnal leg cramps (03/23/2017), OA (osteoarthritis), OSA (obstructive sleep apnea), Pneumonia (02/2018), and Prostate cancer [facility]) (urologist-  dr ottelin/  oncologist-  dr Tammi Klippel). here with:  1.  Alzheimer's disease  --MMSE 5 out of 30 previously 8 out of 30 --Advised okay to stop Namenda as it may not be offering much benefit at this time --Continue Abilify --Advised that he should have 24-hour supervision and assistance with his gait to prevent falls --Follow-up in 6 months or sooner if needed   I spent 30 minutes of face-to-face and non-face-to-face time with patient.  This included previsit chart review, lab review, study review, order entry, electronic health record documentation, patient education.  Ward Givens, MSN, NP-C 08/18/2020, 9:40 AM [facility] [address] [place], [place] [address] [phone]

## 2020-08-18 NOTE — Patient Instructions (Addendum)
Your Plan:  Continue Abilify Ok to stop Namenda Melatonin 1-3 mg 2 hours If your symptoms worsen or you develop new symptoms please let us know.   Thank you for coming to see [place] at [facility]. I hope we have been able to provide you high quality care today.  You may receive a patient satisfaction survey over the next few weeks. We would appreciate your feedback and comments so that we may continue to improve ourselves and the health of our patients.

## 2020-08-31 ENCOUNTER — Other Ambulatory Visit: Payer: Self-pay | Admitting: Adult Health

## 2020-08-31 DIAGNOSIS — C61 Malignant neoplasm of prostate: Secondary | ICD-10-CM | POA: Diagnosis not present

## 2020-08-31 DIAGNOSIS — F0281 Dementia in other diseases classified elsewhere with behavioral disturbance: Secondary | ICD-10-CM

## 2020-08-31 DIAGNOSIS — G309 Alzheimer's disease, unspecified: Secondary | ICD-10-CM

## 2020-08-31 DIAGNOSIS — Z5111 Encounter for antineoplastic chemotherapy: Secondary | ICD-10-CM | POA: Diagnosis not present

## 2020-09-13 ENCOUNTER — Telehealth: Payer: Self-pay | Admitting: Adult Health

## 2020-09-13 ENCOUNTER — Other Ambulatory Visit: Payer: Self-pay | Admitting: Adult Health

## 2020-09-13 DIAGNOSIS — F418 Other specified anxiety disorders: Secondary | ICD-10-CM

## 2020-09-13 DIAGNOSIS — F4321 Adjustment disorder with depressed mood: Secondary | ICD-10-CM

## 2020-09-13 NOTE — Telephone Encounter (Signed)
I called son, pt has had seems to be progressive worsening he states regarding shaking, tremors upper body, cantt walk on own, this comes and goes.  No falls.  Has multiple caregivers along with wife 2 days week.  I stated from MM/NP last note no mention of shaking/tremors. Has shuffling gait.  I relayed that underlying infections, blood sugar, fatigue, stress can cause.  Recommended to see family physician for initial eval.  If anything per NP will let him know. He appreciated this.

## 2020-09-13 NOTE — Telephone Encounter (Signed)
Pt's son [name] called wanting to discuss his fathers shaking with RN. Son states it has gotten really bad. Please advise.

## 2020-10-11 DIAGNOSIS — N3941 Urge incontinence: Secondary | ICD-10-CM | POA: Diagnosis not present

## 2020-10-11 DIAGNOSIS — Z5111 Encounter for antineoplastic chemotherapy: Secondary | ICD-10-CM | POA: Diagnosis not present

## 2020-10-11 DIAGNOSIS — C61 Malignant neoplasm of prostate: Secondary | ICD-10-CM | POA: Diagnosis not present

## 2020-11-23 ENCOUNTER — Telehealth: Payer: Self-pay

## 2020-11-29 ENCOUNTER — Ambulatory Visit (INDEPENDENT_AMBULATORY_CARE_PROVIDER_SITE_OTHER): Payer: Medicare Other | Admitting: Family Medicine

## 2020-11-29 ENCOUNTER — Other Ambulatory Visit: Payer: Self-pay

## 2020-11-29 ENCOUNTER — Encounter: Payer: Self-pay | Admitting: Family Medicine

## 2020-11-29 VITALS — BP 106/66 | HR 81 | Temp 98.3°F | Ht 65.5 in

## 2020-11-29 DIAGNOSIS — Z993 Dependence on wheelchair: Secondary | ICD-10-CM | POA: Diagnosis not present

## 2020-11-29 DIAGNOSIS — G309 Alzheimer's disease, unspecified: Secondary | ICD-10-CM | POA: Diagnosis not present

## 2020-11-29 DIAGNOSIS — F0281 Dementia in other diseases classified elsewhere with behavioral disturbance: Secondary | ICD-10-CM

## 2020-11-29 DIAGNOSIS — I10 Essential (primary) hypertension: Secondary | ICD-10-CM | POA: Diagnosis not present

## 2020-11-29 DIAGNOSIS — Z741 Need for assistance with personal care: Secondary | ICD-10-CM | POA: Diagnosis not present

## 2020-11-29 DIAGNOSIS — R531 Weakness: Secondary | ICD-10-CM | POA: Diagnosis not present

## 2020-11-29 DIAGNOSIS — K219 Gastro-esophageal reflux disease without esophagitis: Secondary | ICD-10-CM

## 2020-11-29 DIAGNOSIS — Z7409 Other reduced mobility: Secondary | ICD-10-CM

## 2020-11-29 NOTE — Progress Notes (Addendum)
Established Patient Office Visit  Subjective:  Patient ID: Ricky [name], male    DOB: Jun 14, 1941  Age: 80 y.o. MRN: [id number]  CC:  Chief Complaint  Patient presents with  . Dementia    HPI [name] presents for follow up of Alzheimer's dementia. He is followed by neurology with his next appointment in May. He is here with his son today. His son reports that [facility] come out a few weeks ago to complete an in home evaluation. They reccommended a hospital bed, bedside commode and PT evaluation for [facility]. Ricky [name] lives in his own home and has rotating caregivers so that someone is always with him. He requires assistance with all ADLs. Ricky [name] is in a wheelchair. He moves from bed to chair or wheelchair, but is not able to ambulate. He has generalized weakness. He has had some "soft" falls per his son's report.   Declined blood work today.    Past Medical History:  Diagnosis Date  . Chronic cough   . Deviated nasal septum   . 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 Family history of prostate cancer   . GERD (gastroesophageal reflux disease)   . History of adenomatous polyp of colon    2008; 2011  . History of exercise stress test    01-14-2009 by dr hochrein-- normal w/ no ischemia  . Hot flashes    DUE TO HORMONE THERAPY FOR PROSTATE CANCER  . Hyperlipidemia   . Hypertension   . Mild memory disturbance    per dr Jannifer Franklin note (neurologist)  . Nocturia more than twice per night   . Nocturnal leg cramps 03/23/2017  . OA (osteoarthritis)   . OSA (obstructive sleep apnea)    moderate osa per study 02-12-2017  cpap recommended--- per pt never heard back from doctor for obtaining cpap  . Pneumonia 02/2018  . Prostate cancer [facility]) urologist-  dr ottelin/  oncologist-  dr Tammi Klippel   dx 10-10-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, Gleason 3+4, PSA 9.02, vol 78cc started ADT to decrease size;  08/ 2018 prostate decrease size vol 46cc and PSA 0.36--- scheduled for radiative prostate  seed implants    Past Surgical History:  Procedure Laterality Date  . CATARACT EXTRACTION W/ INTRAOCULAR LENS  IMPLANT, BILATERAL  2016  . COLONOSCOPY  last one 08-08-2013  . CYSTOSCOPY N/A 08/10/2017   Procedure: CYSTOSCOPY FLEXIBLE;  Surgeon: Kathie Rhodes, MD;  [facility];  Service: Urology;  Laterality: N/A;  NO SEEDS FOUND IN BLADDER  . HAND SURGERY Left 1992  . KNEE ARTHROSCOPY Bilateral 1960's and 70's  . PROSTATE BIOPSY  10-10-2016   dr Karsten Ro office  . RADIOACTIVE SEED IMPLANT N/A 08/10/2017   Procedure: RADIOACTIVE SEED IMPLANT/BRACHYTHERAPY IMPLANT;  Surgeon: Kathie Rhodes, MD;  Location[facility];  Service: Urology;  Laterality: N/A;    64   SEEDS IMPLANTED  . ROTATOR CUFF REPAIR Right 2004  . SPACE OAR INSTILLATION N/A 08/10/2017   Procedure: SPACE OAR INSTILLATION;  Surgeon: Kathie Rhodes, MD;  Location: [facility];  Service: Urology;  Laterality: N/A;  . TOE SURGERY Left 1970   little toe  . TOTAL KNEE ARTHROPLASTY Bilateral 01-25-2009   dr Wynelle Link [facility]    Family History  Problem Relation Age of Onset  . Healthy Mother   . Prostate cancer Father        Dx 8s; Deceased 57  . Prostate cancer Brother 23  currently 70  . Alcohol abuse Brother   . Bipolar disorder Brother   . Prostate cancer Paternal Uncle        unsure of age; possibly 2nd uncle also has prostate ca  . Prostate cancer Maternal Uncle        unsure of age  . Breast cancer Maternal Aunt        unsure of age  . Colon cancer Neg Hx   . Esophageal cancer Neg Hx   . Rectal cancer Neg Hx   . Stomach cancer Neg Hx     Social History   Socioeconomic History  . Marital status: Divorced    Spouse name: [name]  . Number of children: 3  . Years of education: BA  . Highest education level: Not on file  Occupational History  . Occupation: Retired  Tobacco Use  . Smoking status: Never Smoker  . Smokeless tobacco: Never Used  Vaping Use  .  Vaping Use: Never used  Substance and Sexual Activity  . Alcohol use: Yes    Comment: rare  . Drug use: No  . Sexual activity: Yes    Partners: Female    Comment: vasectomy  Other Topics Concern  . Not on file  Social History Narrative   Lives at home w/ his wife, patient has left his wife and is now staying with his son [name] as of 03/19/2019   Right-handed   Caffeine: none   Social Determinants of Health   Financial Resource Strain: Not on file  Food Insecurity: Not on file  Transportation Needs: Not on file  Physical Activity: Not on file  Stress: Not on file  Social Connections: Not on file  Intimate Partner Violence: Not on file    Outpatient Medications Prior to Visit  Medication Sig Dispense Refill  . ARIPiprazole (ABILIFY) 2 MG tablet TAKE 1 TABLET (2 MG TOTAL) BY MOUTH EVERY EVENING. 90 tablet 1  . busPIRone (BUSPAR) 5 MG tablet Take 2 tablets (10 mg total) by mouth 3 (three) times daily as needed. 540 tablet 0  . eplerenone (INSPRA) 50 MG tablet Take 50 mg by mouth 2 (two) times daily.    Marland Kitchen FIBER FORMULA PO Take 3 capsules by mouth 2 (two) times daily.    Marland Kitchen FLUoxetine (PROZAC) 40 MG capsule Take 1 capsule (40 mg total) by mouth daily. 90 capsule 3  . memantine (NAMENDA) 10 MG tablet TAKE 1 TABLET BY MOUTH TWICE A DAY (Patient taking differently: Take 10 mg by mouth 2 (two) times daily.) 180 tablet 4  . spironolactone (ALDACTONE) 50 MG tablet Take 50 mg by mouth daily.    Marland Kitchen tolterodine (DETROL LA) 4 MG 24 hr capsule Take 4 mg by mouth at bedtime.   10   No facility-administered medications prior to visit.    No Known Allergies  ROS Review of Systems As per HPI.    Objective:    Physical Exam Vitals and nursing note reviewed.  Constitutional:      General: He is not in acute distress. HENT:     Head: Normocephalic and atraumatic.     Mouth/Throat:     Mouth: Mucous membranes are moist.     Pharynx: Oropharynx is clear.  Cardiovascular:     Rate and  Rhythm: Normal rate and regular rhythm.     Heart sounds: Normal heart sounds. No murmur heard.   Pulmonary:     Effort: Pulmonary effort is normal. No respiratory distress.  Breath sounds: Normal breath sounds.  Musculoskeletal:     Right lower leg: No edema.     Left lower leg: No edema.  Skin:    General: Skin is warm and dry.  Neurological:     Mental Status: Mental status is at baseline. He is confused.     Motor: Weakness (generalized) present.     Gait: Gait abnormal (in wheelchair).  Psychiatric:        Speech: He is noncommunicative.        Cognition and Memory: Cognition is impaired.     BP 106/66   Pulse 81   Temp 98.3 F (36.8 C) (Temporal)   Ht 5' 5.5" (1.664 m)   BMI 28.06 kg/m  Wt Readings from Last 3 Encounters:  08/18/20 171 lb 3.2 oz (77.7 kg)  02/16/20 174 lb (78.9 kg)  02/05/20 174 lb (78.9 kg)     Health Maintenance Due  Topic Date Due  . Hepatitis C Screening  Never done  . COVID-19 Vaccine (1) Never done  . TETANUS/TDAP  06/10/2015    There are no preventive care reminders to display for this patient.  Lab Results  Component Value Date   TSH 0.950 01/23/2020   Lab Results  Component Value Date   WBC 4.4 01/23/2020   HGB 12.8 (L) 01/23/2020   HCT 38.5 (L) 01/23/2020   MCV 86.9 01/23/2020   PLT 187 01/23/2020   Lab Results  Component Value Date   NA 138 01/23/2020   K 4.1 01/23/2020   CO2 24 01/23/2020   GLUCOSE 90 01/23/2020   BUN 30 (H) 01/23/2020   CREATININE 1.33 (H) 01/23/2020   BILITOT 0.7 01/23/2020   ALKPHOS 75 01/23/2020   AST 17 01/23/2020   ALT 16 01/23/2020   PROT 6.0 (L) 01/23/2020   ALBUMIN 3.9 01/23/2020   CALCIUM 9.4 01/23/2020   ANIONGAP 7 01/23/2020   Lab Results  Component Value Date   CHOL 173 07/08/2019   Lab Results  Component Value Date   HDL 44 07/08/2019   Lab Results  Component Value Date   LDLCALC 109 (H) 07/08/2019   Lab Results  Component Value Date   TRIG 107 07/08/2019    Lab Results  Component Value Date   CHOLHDL 3.9 07/08/2019   No results found for: HGBA1C    Assessment & Plan:   Yacoub was seen today for dementia.  Diagnoses and all orders for this visit:  Alzheimer's dementia with behavioral disturbance, unspecified timing of dementia onset ([place]) Worsening symptoms. Followed by neurology. Patient requires assistance with all ADLs. He is unable to ambulate and transfers only from bed to chair. Patient has Alzheimer's dementia which requires his head to be positioned in ways not feasibile with a normal bed. Head and torso must be elevated at least 30 degrees to prevent aspiration due to dementia and laryngopharyngeal reflux. Gagging and choking requires immediate changes in body position which cannot be achieved with a normal bed. He would also benefit from the following: -     For home use only DME Hospital bed -     For home use only DME Bedside commode -     Ambulatory referral to [place] weakness -     For home use only DME Hospital bed -     For home use only DME Bedside commode -     Ambulatory referral to [facility]  Wheelchair bound -     For home use  only DME Hospital bed -     For home use only DME Bedside commode -     Ambulatory referral to [place]  Impaired mobility -     For home use only DME Hospital bed -     For home use only DME Bedside commode -     Ambulatory referral to [place] assistance with activities of daily living (ADL) -     For home use only DME Hospital bed -     For home use only DME Bedside commode -     Ambulatory referral to [place] hypertension Not currently on medication. BP at goal. Declined lab work today.   Follow-up: Return if symptoms worsen or fail to improve. Keep scheduled appointment with Neurology. Follow up with PCP for chronic follow up as needed.     Gwenlyn Perking, FNP

## 2020-11-29 NOTE — Patient Instructions (Signed)

## 2020-12-06 DIAGNOSIS — Z993 Dependence on wheelchair: Secondary | ICD-10-CM | POA: Diagnosis not present

## 2020-12-06 DIAGNOSIS — G4733 Obstructive sleep apnea (adult) (pediatric): Secondary | ICD-10-CM | POA: Diagnosis not present

## 2020-12-06 DIAGNOSIS — F0281 Dementia in other diseases classified elsewhere with behavioral disturbance: Secondary | ICD-10-CM | POA: Diagnosis not present

## 2020-12-06 DIAGNOSIS — R531 Weakness: Secondary | ICD-10-CM | POA: Diagnosis not present

## 2020-12-06 DIAGNOSIS — Z96653 Presence of artificial knee joint, bilateral: Secondary | ICD-10-CM | POA: Diagnosis not present

## 2020-12-06 DIAGNOSIS — N401 Enlarged prostate with lower urinary tract symptoms: Secondary | ICD-10-CM | POA: Diagnosis not present

## 2020-12-06 DIAGNOSIS — G309 Alzheimer's disease, unspecified: Secondary | ICD-10-CM | POA: Diagnosis not present

## 2020-12-06 DIAGNOSIS — E785 Hyperlipidemia, unspecified: Secondary | ICD-10-CM | POA: Diagnosis not present

## 2020-12-06 DIAGNOSIS — Z7409 Other reduced mobility: Secondary | ICD-10-CM | POA: Diagnosis not present

## 2020-12-06 DIAGNOSIS — M1990 Unspecified osteoarthritis, unspecified site: Secondary | ICD-10-CM | POA: Diagnosis not present

## 2020-12-06 DIAGNOSIS — I10 Essential (primary) hypertension: Secondary | ICD-10-CM | POA: Diagnosis not present

## 2020-12-06 DIAGNOSIS — R351 Nocturia: Secondary | ICD-10-CM | POA: Diagnosis not present

## 2020-12-06 DIAGNOSIS — Z86010 Personal history of colonic polyps: Secondary | ICD-10-CM | POA: Diagnosis not present

## 2020-12-06 DIAGNOSIS — Z8546 Personal history of malignant neoplasm of prostate: Secondary | ICD-10-CM | POA: Diagnosis not present

## 2020-12-06 DIAGNOSIS — K219 Gastro-esophageal reflux disease without esophagitis: Secondary | ICD-10-CM | POA: Diagnosis not present

## 2020-12-21 ENCOUNTER — Ambulatory Visit (INDEPENDENT_AMBULATORY_CARE_PROVIDER_SITE_OTHER): Payer: Medicare Other

## 2020-12-21 ENCOUNTER — Ambulatory Visit: Payer: Medicare Other | Admitting: Adult Health

## 2020-12-21 ENCOUNTER — Other Ambulatory Visit: Payer: Self-pay

## 2020-12-21 DIAGNOSIS — R531 Weakness: Secondary | ICD-10-CM

## 2020-12-21 DIAGNOSIS — N401 Enlarged prostate with lower urinary tract symptoms: Secondary | ICD-10-CM

## 2020-12-21 DIAGNOSIS — G309 Alzheimer's disease, unspecified: Secondary | ICD-10-CM | POA: Diagnosis not present

## 2020-12-21 DIAGNOSIS — M1990 Unspecified osteoarthritis, unspecified site: Secondary | ICD-10-CM

## 2020-12-21 DIAGNOSIS — F0281 Dementia in other diseases classified elsewhere with behavioral disturbance: Secondary | ICD-10-CM

## 2020-12-21 DIAGNOSIS — Z993 Dependence on wheelchair: Secondary | ICD-10-CM

## 2020-12-21 DIAGNOSIS — E785 Hyperlipidemia, unspecified: Secondary | ICD-10-CM

## 2020-12-21 DIAGNOSIS — Z96653 Presence of artificial knee joint, bilateral: Secondary | ICD-10-CM

## 2020-12-21 DIAGNOSIS — I10 Essential (primary) hypertension: Secondary | ICD-10-CM | POA: Diagnosis not present

## 2020-12-21 DIAGNOSIS — Z7409 Other reduced mobility: Secondary | ICD-10-CM

## 2020-12-21 DIAGNOSIS — G4733 Obstructive sleep apnea (adult) (pediatric): Secondary | ICD-10-CM

## 2020-12-21 DIAGNOSIS — Z8546 Personal history of malignant neoplasm of prostate: Secondary | ICD-10-CM

## 2020-12-21 DIAGNOSIS — R351 Nocturia: Secondary | ICD-10-CM

## 2020-12-21 DIAGNOSIS — K219 Gastro-esophageal reflux disease without esophagitis: Secondary | ICD-10-CM

## 2020-12-21 DIAGNOSIS — Z86010 Personal history of colonic polyps: Secondary | ICD-10-CM

## 2020-12-30 ENCOUNTER — Encounter: Payer: Self-pay | Admitting: *Deleted

## 2020-12-30 ENCOUNTER — Telehealth: Payer: Self-pay

## 2020-12-30 DIAGNOSIS — K219 Gastro-esophageal reflux disease without esophagitis: Secondary | ICD-10-CM

## 2020-12-30 DIAGNOSIS — F0281 Dementia in other diseases classified elsewhere with behavioral disturbance: Secondary | ICD-10-CM

## 2020-12-30 DIAGNOSIS — G309 Alzheimer's disease, unspecified: Secondary | ICD-10-CM

## 2020-12-30 DIAGNOSIS — Z7409 Other reduced mobility: Secondary | ICD-10-CM

## 2020-12-30 DIAGNOSIS — Z993 Dependence on wheelchair: Secondary | ICD-10-CM

## 2020-12-30 DIAGNOSIS — R531 Weakness: Secondary | ICD-10-CM

## 2020-12-30 DIAGNOSIS — Z741 Need for assistance with personal care: Secondary | ICD-10-CM

## 2020-12-30 NOTE — Telephone Encounter (Signed)
Faxing complete

## 2020-12-30 NOTE — Telephone Encounter (Signed)
Please sign pending order for [facility] lift, all other orders have been previously order & faxed earlier today to [facility]

## 2020-12-30 NOTE — Telephone Encounter (Signed)
Traci called from [facility] stating that pt needs a Bedside Comode, Hospital Bed, and a [employer]. Tressia Miners has already spoke with someone at [facility] who confirmed that they have all items in stock. [facility] [place] [facility] needs orders, OV notes with diagnosis, and Demographics faxed to them at [phone] Attn: Santiago Glad.  Can contact Traci if needed. [phone]

## 2021-01-11 ENCOUNTER — Telehealth: Payer: Self-pay

## 2021-01-11 NOTE — Telephone Encounter (Signed)
Spoke with patient's son and made him aware that we had received an approval letter from [facility] for [employer] lift.  I also called Traci with [facility] [phone] to make her aware.  Received her voicemail and left message informing her of this and that per son family is still waiting on hospital bed.

## 2021-01-13 DIAGNOSIS — Z7409 Other reduced mobility: Secondary | ICD-10-CM | POA: Diagnosis not present

## 2021-01-13 DIAGNOSIS — R531 Weakness: Secondary | ICD-10-CM | POA: Diagnosis not present

## 2021-01-13 DIAGNOSIS — G309 Alzheimer's disease, unspecified: Secondary | ICD-10-CM | POA: Diagnosis not present

## 2021-01-31 ENCOUNTER — Telehealth: Payer: Self-pay

## 2021-01-31 NOTE — Telephone Encounter (Signed)
Ok, agree with plan.

## 2021-02-12 DIAGNOSIS — R531 Weakness: Secondary | ICD-10-CM | POA: Diagnosis not present

## 2021-02-12 DIAGNOSIS — Z7409 Other reduced mobility: Secondary | ICD-10-CM | POA: Diagnosis not present

## 2021-02-12 DIAGNOSIS — G309 Alzheimer's disease, unspecified: Secondary | ICD-10-CM | POA: Diagnosis not present

## 2021-02-17 ENCOUNTER — Ambulatory Visit: Payer: Medicare Other | Admitting: Adult Health

## 2021-03-15 DIAGNOSIS — G309 Alzheimer's disease, unspecified: Secondary | ICD-10-CM | POA: Diagnosis not present

## 2021-03-15 DIAGNOSIS — Z7409 Other reduced mobility: Secondary | ICD-10-CM | POA: Diagnosis not present

## 2021-03-15 DIAGNOSIS — R531 Weakness: Secondary | ICD-10-CM | POA: Diagnosis not present

## 2021-03-20 ENCOUNTER — Other Ambulatory Visit: Payer: Self-pay | Admitting: Adult Health

## 2021-03-20 DIAGNOSIS — G309 Alzheimer's disease, unspecified: Secondary | ICD-10-CM

## 2021-03-20 DIAGNOSIS — F0281 Dementia in other diseases classified elsewhere with behavioral disturbance: Secondary | ICD-10-CM

## 2021-03-21 ENCOUNTER — Other Ambulatory Visit: Payer: Self-pay | Admitting: *Deleted

## 2021-03-21 DIAGNOSIS — F0390 Unspecified dementia without behavioral disturbance: Secondary | ICD-10-CM

## 2021-03-21 MED ORDER — BUSPIRONE HCL 5 MG PO TABS: 10.0000 mg | ORAL_TABLET | Freq: Three times a day (TID) | ORAL | 0 refills | Status: DC | PRN

## 2021-04-06 ENCOUNTER — Telehealth: Payer: Self-pay | Admitting: Adult Health

## 2021-04-06 NOTE — Telephone Encounter (Signed)
I spoke to son.  Pt is bedridden, not taking any medication from [place] , stopped the abilify 2 months ago.  Given buspar by pcp, which he stated he is not giving.  Pt is eating.  Does not respond in sentences.  Was ordered PT by pcp by [facility].  I relayed that PT may recommend Social work to come out and see what cold be done to make pt more comfortable (palliative care/hospice?).  He did not think pt needed appt with [place] in office or VV. I agreed.  I told him I would relay message to [place], NP as FYI.  He appreciated call.

## 2021-04-06 NOTE — Telephone Encounter (Signed)
Pt's son(on DPR)is asking for a call to discuss status of pt.  Son asked that the appointment that was scheduled for 06-30 be cancelled.Pt's son also declined a my chart appointment.  Son is asking for a call from RN to discuss how pt has declined.

## 2021-04-07 NOTE — Telephone Encounter (Signed)
noted 

## 2021-04-08 ENCOUNTER — Encounter: Payer: Self-pay | Admitting: Family Medicine

## 2021-04-08 ENCOUNTER — Other Ambulatory Visit: Payer: Self-pay

## 2021-04-08 ENCOUNTER — Ambulatory Visit (INDEPENDENT_AMBULATORY_CARE_PROVIDER_SITE_OTHER): Payer: Medicare Other | Admitting: Family Medicine

## 2021-04-08 VITALS — BP 97/60 | HR 57 | Ht 65.5 in

## 2021-04-08 DIAGNOSIS — G309 Alzheimer's disease, unspecified: Secondary | ICD-10-CM

## 2021-04-08 DIAGNOSIS — Z993 Dependence on wheelchair: Secondary | ICD-10-CM

## 2021-04-08 DIAGNOSIS — L89311 Pressure ulcer of right buttock, stage 1: Secondary | ICD-10-CM

## 2021-04-08 DIAGNOSIS — F0281 Dementia in other diseases classified elsewhere with behavioral disturbance: Secondary | ICD-10-CM

## 2021-04-08 MED ORDER — DUODERM CGF DRESSING EX MISC: 1.0000 | CUTANEOUS | 2 refills | Status: AC | PRN

## 2021-04-08 MED ORDER — DUODERM CGF DRESSING EX MISC: 1.0000 | CUTANEOUS | 2 refills | Status: DC | PRN

## 2021-04-08 NOTE — Patient Instructions (Signed)
Pressure Injury °A pressure injury is damage to the skin and underlying tissue that results from pressure being applied to an area of the body. It often affects people who must spend a long time in a bed or chair because of a medical condition. °Pressure injuries usually occur: °Over bony parts of the body, such as the tailbone, shoulders, elbows, hips, heels, spine, ankles, and back of the head. °Under medical devices that make contact with the body, such as respiratory equipment, stockings, tubes, and splints. °Pressure injuries start as reddened areas on the skin and can lead to pain and an open wound. °What are the causes? °This condition is caused by frequent or constant pressure to an area of the body. Decreased blood flow to the skin can eventually cause the skin tissue to die and break down, causing a wound. °What increases the risk? °You are more likely to develop this condition if you: °Are in the hospital or an extended care facility. °Are bedridden or in a wheelchair. °Have an injury or disease that keeps you from: °Moving normally. °Feeling pain or pressure. °Have a condition that: °Makes you sleepy or less alert. °Causes poor blood flow. °Need to wear a medical device. °Have poor control of your bladder or bowel functions (incontinence). °Have poor nutrition (malnutrition). °If you are at risk for pressure injuries, your health care provider may recommend certain types of mattresses, mattress covers, pillows, cushions, or boots to help prevent them. These may include products filled with air, foam, gel, or sand. °What are the signs or symptoms? °Symptoms of this condition depend on the severity of the injury. Symptoms may include: °Red or dark areas of the skin. °Pain, warmth, or a change of skin texture. °Blisters. °An open wound. °How is this diagnosed? °This condition is diagnosed with a medical history and physical exam. You may also have tests, such as: °Blood tests. °Imaging tests. °Blood flow  tests. °Your pressure injury will be staged based on its severity. Staging is based on: °The depth of the tissue injury, including whether there is exposure of muscle, bone, or tendon. °The cause of the pressure injury. °How is this treated? °This condition may be treated by: °Relieving or redistributing pressure on your skin. This includes: °Frequently changing your position. °Avoiding positions that caused the wound or that can make the wound worse. °Using specific bed mattresses, chair cushions, or protective boots. °Moving medical devices from an area of pressure, or placing padding between the skin and the device. °Using foams, creams, or powders to prevent rubbing (friction) on the skin. °Keeping your skin clean and dry. This may include using a skin cleanser or skin barrier as told by your health care provider. °Cleaning your injury and removing any dead tissue from the wound (debridement). °Placing a bandage (dressing) over your injury. °Using medicines for pain or to prevent or treat infection. °Surgery may be needed if other treatments are not working or if your injury is very deep. °Follow these instructions at home: °Wound care °Follow instructions from your health care provider about how to take care of your wound. Make sure you: °Wash your hands with soap and water before and after you change your bandage (dressing). If soap and water are not available, use hand sanitizer. °Change your dressing as told by your health care provider.  °Check your wound every day for signs of infection. Have a caregiver do this for you if you are not able. Check for: °Redness, swelling, or increased pain. °More fluid   or blood. °Warmth. °Pus or a bad smell. °Skin care °Keep your skin clean and dry. Gently pat your skin dry. °Do not rub or massage your skin. °You or a caregiver should check your skin every day for any changes in color or any new blisters or sores (ulcers). °Medicines °Take over-the-counter and prescription  medicines only as told by your health care provider. °If you were prescribed an antibiotic medicine, take or apply it as told by your health care provider. Do not stop using the antibiotic even if your condition improves. °Reducing and redistributing pressure °Do not lie or sit in one position for a long time. Move or change position every 1-2 hours, or as told by your health care provider. °Use pillows or cushions to reduce pressure. Ask your health care provider to recommend cushions or pads for you. °General instructions ° °Eat a healthy diet that includes lots of protein. °Drink enough fluid to keep your urine pale yellow. °Be as active as you can every day. Ask your health care provider to suggest safe exercises or activities. °Do not abuse drugs or alcohol. °Do not use any products that contain nicotine or tobacco, such as cigarettes, e-cigarettes, and chewing tobacco. If you need help quitting, ask your health care provider. °Keep all follow-up visits as told by your health care provider. This is important. °Contact a health care provider if: °You have: °A fever or chills. °Pain that is not helped by medicine. °Any changes in skin color. °New blisters or sores. °Pus or a bad smell coming from your wound. °Redness, swelling, or pain around your wound. °More fluid or blood coming from your wound. °Your wound does not improve after 1-2 weeks of treatment. °Summary °A pressure injury is damage to the skin and underlying tissue that results from pressure being applied to an area of the body. °Do not lie or sit in one position for a long time. Your health care provider may advise you to move or change position every 1-2 hours. °Follow instructions from your health care provider about how to take care of your wound. °Keep all follow-up visits as told by your health care provider. This is important. °This information is not intended to replace advice given to you by your health care provider. Make sure you discuss  any questions you have with your health care provider. °Document Revised: 04/24/2018 Document Reviewed: 04/24/2018 °Elsevier Patient Education © 2022 [employer] ° °

## 2021-04-08 NOTE — Progress Notes (Addendum)
Acute Office Visit  Subjective:    Patient ID: Ricky [name], male    DOB: 09-19-1941, 80 y.o.   MRN: [id number]  Chief Complaint  Patient presents with   Skin Problem    HPI Patient is in today for a small bed sore on his buttocks on the right side. There is no skin breakdown. He is here with his son today. Ricky [name] is bedridden. Ricky [name] has been coming out and requested duoderm dressing for the area. Denies fever, drainage, or signs of infection.   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)    Family history of prostate cancer    GERD (gastroesophageal reflux disease)    History of adenomatous polyp of colon    2008; 2011   History of exercise stress test    01-14-2009 by dr hochrein-- normal w/ no ischemia   Hot flashes    DUE TO HORMONE THERAPY FOR PROSTATE CANCER   Hyperlipidemia    Hypertension    Mild memory disturbance    per dr Jannifer Franklin note (neurologist)   Nocturia more than twice per night    Nocturnal leg cramps 03/23/2017   OA (osteoarthritis)    OSA (obstructive sleep apnea)    moderate osa per study 02-12-2017  cpap recommended--- per pt never heard back from doctor for obtaining cpap   Pneumonia 02/2018   Prostate cancer [facility]) urologist-  dr ottelin/  oncologist-  dr Tammi Klippel   dx 10-10-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, Gleason 3+4, PSA 9.02, vol 78cc started ADT to decrease size;  08/ 2018 prostate decrease size vol 46cc and PSA 0.36--- scheduled for radiative prostate seed implants    Past Surgical History:  Procedure Laterality Date   CATARACT EXTRACTION W/ INTRAOCULAR LENS  IMPLANT, BILATERAL  2016   COLONOSCOPY  last one 08-08-2013   CYSTOSCOPY N/A 08/10/2017   Procedure: CYSTOSCOPY FLEXIBLE;  Surgeon: Kathie Rhodes, MD;  Location: [facility];  Service: Urology;  Laterality: N/A;  NO SEEDS FOUND IN BLADDER   HAND SURGERY Left 1992   KNEE ARTHROSCOPY  Bilateral 1960's and 70's   PROSTATE BIOPSY  10-10-2016   dr Karsten Ro office   RADIOACTIVE SEED IMPLANT N/A 08/10/2017   Procedure: RADIOACTIVE SEED IMPLANT/BRACHYTHERAPY IMPLANT;  Surgeon: Kathie Rhodes, MD;  Location: [place];  Service: Urology;  Laterality: N/A;    64   SEEDS IMPLANTED   ROTATOR CUFF REPAIR Right 2004   SPACE OAR INSTILLATION N/A 08/10/2017   Procedure: SPACE OAR INSTILLATION;  Surgeon: Kathie Rhodes, MD;  Location: [place];  Service: Urology;  Laterality: N/A;   TOE SURGERY Left 1970   little toe   TOTAL KNEE ARTHROPLASTY Bilateral 01-25-2009   dr Wynelle Link [facility]    Family History  Problem Relation Age of Onset   Healthy Mother    Prostate cancer Father        Dx 72s; Deceased 12   Prostate cancer Brother 70       currently 60   Alcohol abuse Brother    Bipolar disorder Brother    Prostate cancer Paternal Uncle        unsure of age; possibly 2nd uncle also has prostate ca   Prostate cancer Maternal Uncle        unsure of age   Breast cancer Maternal Aunt        unsure of  age   Colon cancer Neg Hx    Esophageal cancer Neg Hx    Rectal cancer Neg Hx    Stomach cancer Neg Hx     Social History   Socioeconomic History   Marital status: Divorced    Spouse name: Ann   Number of children: 3   Years of education: BA   Highest education level: Not on file  Occupational History   Occupation: Retired  Tobacco Use   Smoking status: Never   Smokeless tobacco: Never  Vaping Use   Vaping Use: Never used  Substance and Sexual Activity   Alcohol use: Yes    Comment: rare   Drug use: No   Sexual activity: Yes    Partners: Female    Comment: vasectomy  Other Topics Concern   Not on file  Social History Narrative   Lives at home w/ his wife, patient has left his wife and is now staying with his son Ricky [name] as of 03/19/2019   Right-handed   Caffeine: none   Social Determinants of Radio broadcast assistant Strain: Not on  file  Food Insecurity: Not on file  Transportation Needs: Not on file  Physical Activity: Not on file  Stress: Not on file  Social Connections: Not on file  Intimate Partner Violence: Not on file    Outpatient Medications Prior to Visit  Medication Sig Dispense Refill   busPIRone (BUSPAR) 5 MG tablet Take 2 tablets (10 mg total) by mouth 3 (three) times daily as needed. 540 tablet 0   eplerenone (INSPRA) 50 MG tablet Take 50 mg by mouth 2 (two) times daily.     FIBER FORMULA PO Take 3 capsules by mouth 2 (two) times daily.     ARIPiprazole (ABILIFY) 2 MG tablet TAKE 1 TABLET BY MOUTH EVERY EVENING. (Patient not taking: Reported on 04/08/2021) 90 tablet 1   No facility-administered medications prior to visit.    No Known Allergies  Review of Systems As per HPI.     Objective:    Physical Exam Vitals and nursing note reviewed.  Constitutional:      General: He is not in acute distress.    Appearance: He is not toxic-appearing or diaphoretic.  Cardiovascular:     Rate and Rhythm: Normal rate and regular rhythm.     Heart sounds: Normal heart sounds. No murmur heard. Skin:    General: Skin is warm and dry.  Neurological:     Mental Status: Mental status is at baseline.    BP 97/60   Pulse (!) 57   Ht 5' 5.5" (1.664 m)   BMI 28.06 kg/m  Wt Readings from Last 3 Encounters:  08/18/20 171 lb 3.2 oz (77.7 kg)  02/16/20 174 lb (78.9 kg)  02/05/20 174 lb (78.9 kg)    Health Maintenance Due  Topic Date Due   COVID-19 Vaccine (1) Never done   Hepatitis C Screening  Never done   Zoster Vaccines- Shingrix (1 of 2) Never done   TETANUS/TDAP  06/10/2015    There are no preventive care reminders to display for this patient.   Lab Results  Component Value Date   TSH 0.950 01/23/2020   Lab Results  Component Value Date   WBC 4.4 01/23/2020   HGB 12.8 (L) 01/23/2020   HCT 38.5 (L) 01/23/2020   MCV 86.9 01/23/2020   PLT 187 01/23/2020   Lab Results  Component Value  Date   NA 138 01/23/2020   K 4.1  01/23/2020   CO2 24 01/23/2020   GLUCOSE 90 01/23/2020   BUN 30 (H) 01/23/2020   CREATININE 1.33 (H) 01/23/2020   BILITOT 0.7 01/23/2020   ALKPHOS 75 01/23/2020   AST 17 01/23/2020   ALT 16 01/23/2020   PROT 6.0 (L) 01/23/2020   ALBUMIN 3.9 01/23/2020   CALCIUM 9.4 01/23/2020   ANIONGAP 7 01/23/2020   Lab Results  Component Value Date   CHOL 173 07/08/2019   Lab Results  Component Value Date   HDL 44 07/08/2019   Lab Results  Component Value Date   LDLCALC 109 (H) 07/08/2019   Lab Results  Component Value Date   TRIG 107 07/08/2019   Lab Results  Component Value Date   CHOLHDL 3.9 07/08/2019   No results found for: HGBA1C     Assessment & Plan:   Jayd was seen today for skin problem.  Diagnoses and all orders for this visit:  Pressure injury of right buttock, stage 1 x2 Unable to view in office due to patient's status. Duoderm ordered as requested. Keep area clean and dry. HH wound care ordered.  -     Control Gel Formula Dressing (DUODERM CGF DRESSING) MISC; Apply 1 each topically as needed. -     Control Gel Formula Dressing (DUODERM CGF DRESSING) MISC; Apply 1 each topically as needed. -     Ambulatory referral to [place]  Alzheimer's dementia with behavioral disturbance, unspecified timing of dementia onset ([place]) -     Ambulatory referral to [place]  Wheelchair bound -     Ambulatory referral to [place]   Return to office for new or worsening symptoms, or if symptoms persist.   The patient indicates understanding of these issues and agrees with the plan.   Gwenlyn Perking, FNP

## 2021-04-12 ENCOUNTER — Ambulatory Visit: Payer: Medicare Other | Admitting: Adult Health

## 2021-04-14 ENCOUNTER — Telehealth: Payer: Self-pay | Admitting: Family Medicine

## 2021-04-14 DIAGNOSIS — G309 Alzheimer's disease, unspecified: Secondary | ICD-10-CM | POA: Diagnosis not present

## 2021-04-14 DIAGNOSIS — Z7409 Other reduced mobility: Secondary | ICD-10-CM | POA: Diagnosis not present

## 2021-04-14 DIAGNOSIS — R531 Weakness: Secondary | ICD-10-CM | POA: Diagnosis not present

## 2021-04-14 NOTE — Telephone Encounter (Signed)
Ordered, had visit on 04/08/21

## 2021-04-14 NOTE — Addendum Note (Signed)
Addended by: Gwenlyn Perking on: 04/14/2021 12:48 PM   Modules accepted: Orders

## 2021-05-15 DIAGNOSIS — Z7409 Other reduced mobility: Secondary | ICD-10-CM | POA: Diagnosis not present

## 2021-05-15 DIAGNOSIS — G309 Alzheimer's disease, unspecified: Secondary | ICD-10-CM | POA: Diagnosis not present

## 2021-05-15 DIAGNOSIS — R531 Weakness: Secondary | ICD-10-CM | POA: Diagnosis not present

## 2021-06-15 DIAGNOSIS — G309 Alzheimer's disease, unspecified: Secondary | ICD-10-CM | POA: Diagnosis not present

## 2021-06-15 DIAGNOSIS — R531 Weakness: Secondary | ICD-10-CM | POA: Diagnosis not present

## 2021-06-15 DIAGNOSIS — Z7409 Other reduced mobility: Secondary | ICD-10-CM | POA: Diagnosis not present

## 2021-06-19 ENCOUNTER — Other Ambulatory Visit: Payer: Self-pay | Admitting: Family Medicine

## 2021-06-19 DIAGNOSIS — F0390 Unspecified dementia without behavioral disturbance: Secondary | ICD-10-CM

## 2021-07-15 DIAGNOSIS — G309 Alzheimer's disease, unspecified: Secondary | ICD-10-CM | POA: Diagnosis not present

## 2021-07-15 DIAGNOSIS — R531 Weakness: Secondary | ICD-10-CM | POA: Diagnosis not present

## 2021-07-15 DIAGNOSIS — Z7409 Other reduced mobility: Secondary | ICD-10-CM | POA: Diagnosis not present

## 2021-08-10 DIAGNOSIS — N390 Urinary tract infection, site not specified: Secondary | ICD-10-CM | POA: Diagnosis not present

## 2021-08-15 DIAGNOSIS — Z7409 Other reduced mobility: Secondary | ICD-10-CM | POA: Diagnosis not present

## 2021-08-15 DIAGNOSIS — G309 Alzheimer's disease, unspecified: Secondary | ICD-10-CM | POA: Diagnosis not present

## 2021-08-15 DIAGNOSIS — R531 Weakness: Secondary | ICD-10-CM | POA: Diagnosis not present

## 2021-09-14 DIAGNOSIS — R531 Weakness: Secondary | ICD-10-CM | POA: Diagnosis not present

## 2021-09-14 DIAGNOSIS — Z7409 Other reduced mobility: Secondary | ICD-10-CM | POA: Diagnosis not present

## 2021-09-14 DIAGNOSIS — G309 Alzheimer's disease, unspecified: Secondary | ICD-10-CM | POA: Diagnosis not present

## 2021-10-29 ENCOUNTER — Other Ambulatory Visit: Payer: Self-pay | Admitting: Family Medicine

## 2021-10-29 DIAGNOSIS — F0390 Unspecified dementia without behavioral disturbance: Secondary | ICD-10-CM

## 2021-10-31 ENCOUNTER — Telehealth: Payer: Self-pay | Admitting: *Deleted

## 2021-10-31 DIAGNOSIS — F418 Other specified anxiety disorders: Secondary | ICD-10-CM

## 2021-10-31 NOTE — Telephone Encounter (Signed)
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

## 2021-10-31 NOTE — Telephone Encounter (Signed)
busPIRone HCl 5MG  tablets Key: [id number] Sent to plan

## 2021-11-02 ENCOUNTER — Other Ambulatory Visit: Payer: Self-pay | Admitting: Family Medicine

## 2021-11-02 DIAGNOSIS — F0390 Unspecified dementia without behavioral disturbance: Secondary | ICD-10-CM

## 2021-11-02 NOTE — Telephone Encounter (Signed)
Recommend obtaining from [facility] for $4 if his current pharmacy will not price match.

## 2021-11-02 NOTE — Telephone Encounter (Signed)
Denied today Your request has been denied FYI - pt on hospice - aware of this and still denied this to be covered (morgan pt)

## 2022-01-25 IMAGING — DX DG RIBS W/ CHEST 3+V*R*
5 series · 5 of 5 positions shown · non-contrast
Comparison: Chest x-ray 10/16/2019.

CLINICAL DATA: 78-year-old male with history of trauma from a fall
complaining of right-sided rib pain.

EXAM:
RIGHT RIBS AND CHEST - 3+ VIEW

[rib pa]
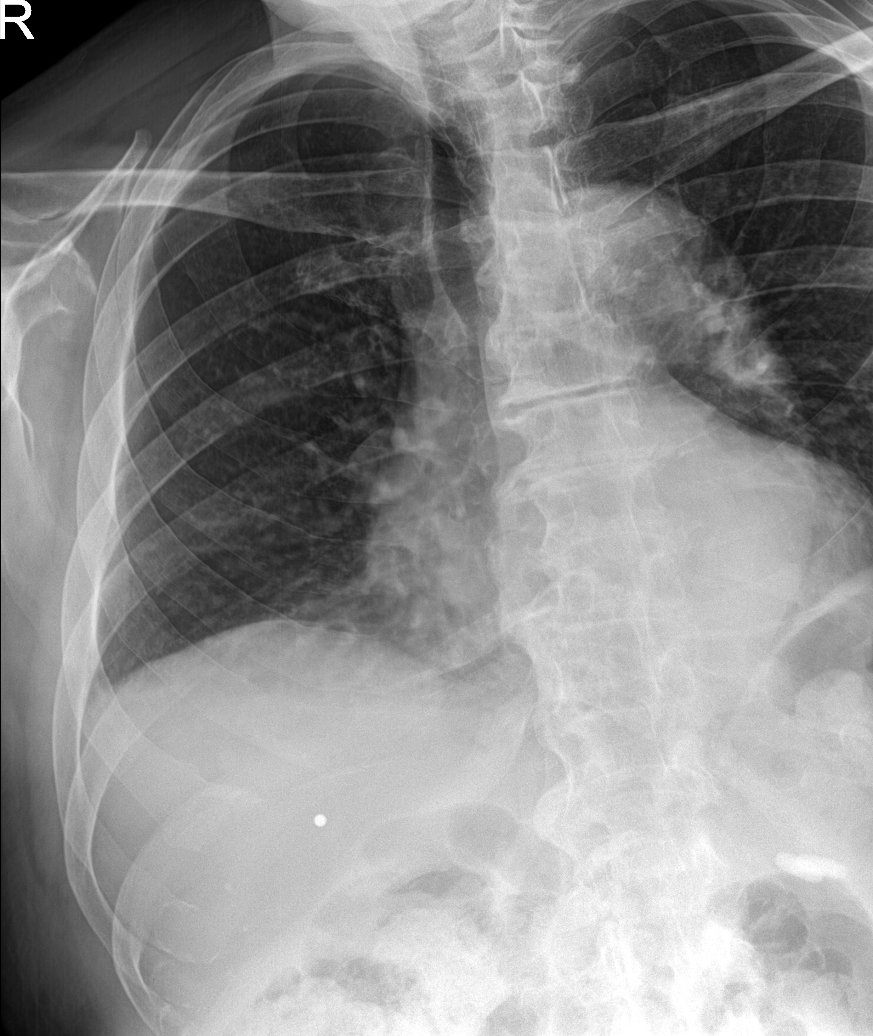

[rib obl (1 of 2)]
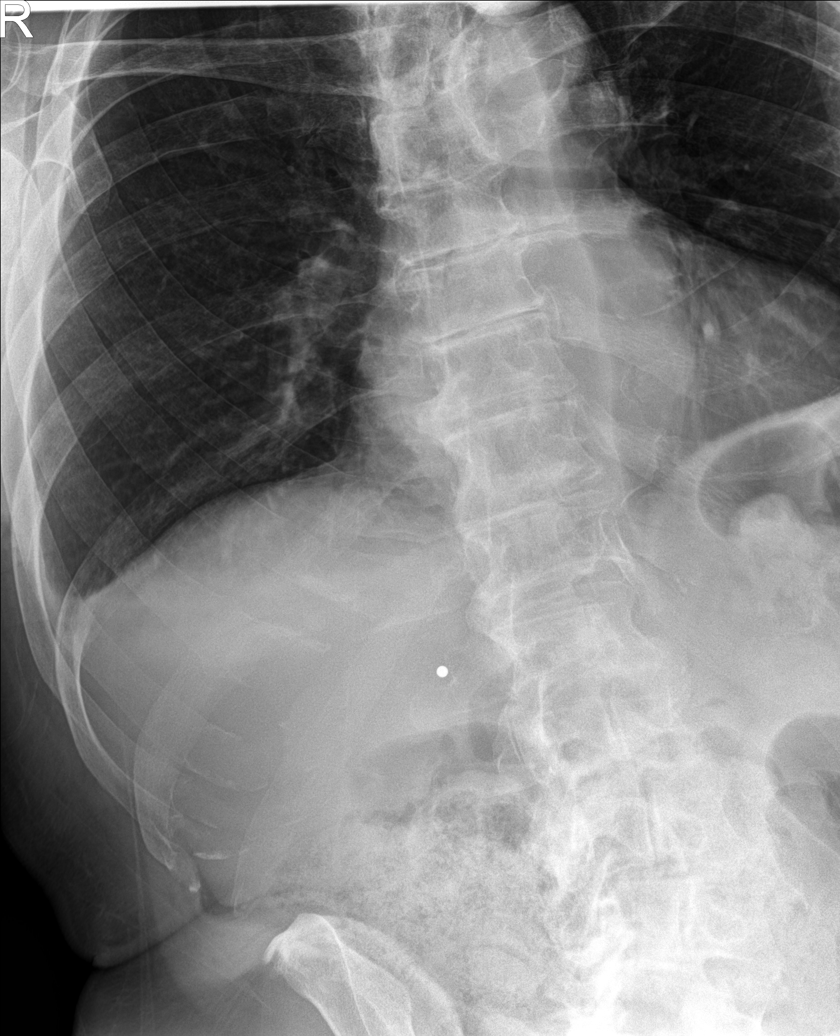

[rib obl (2 of 2)]
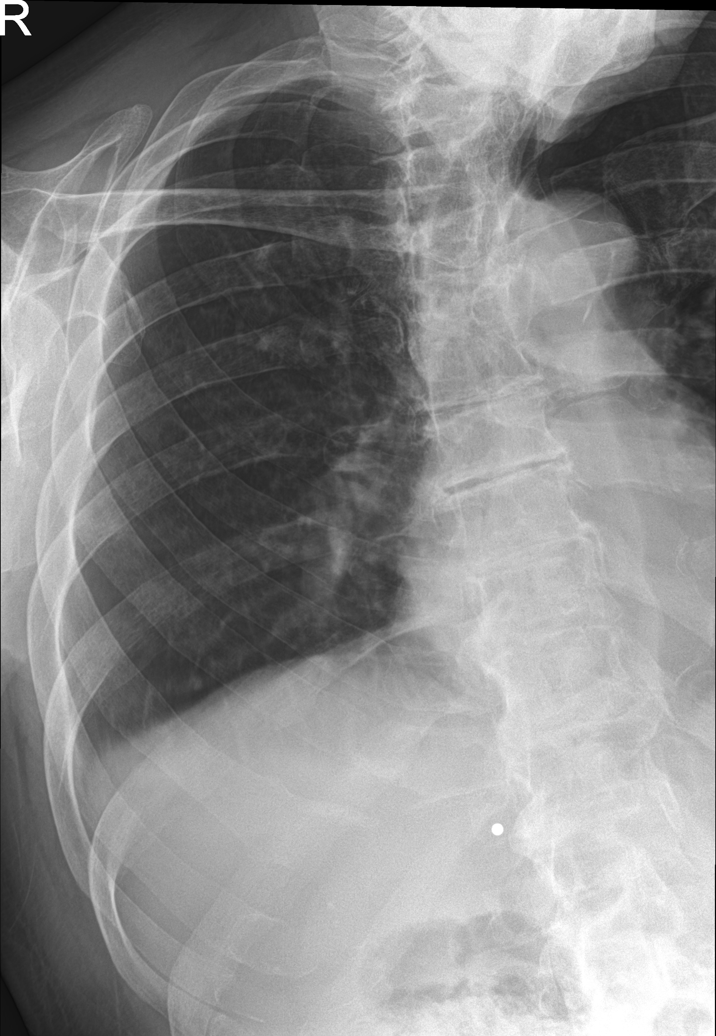

[chest pa]
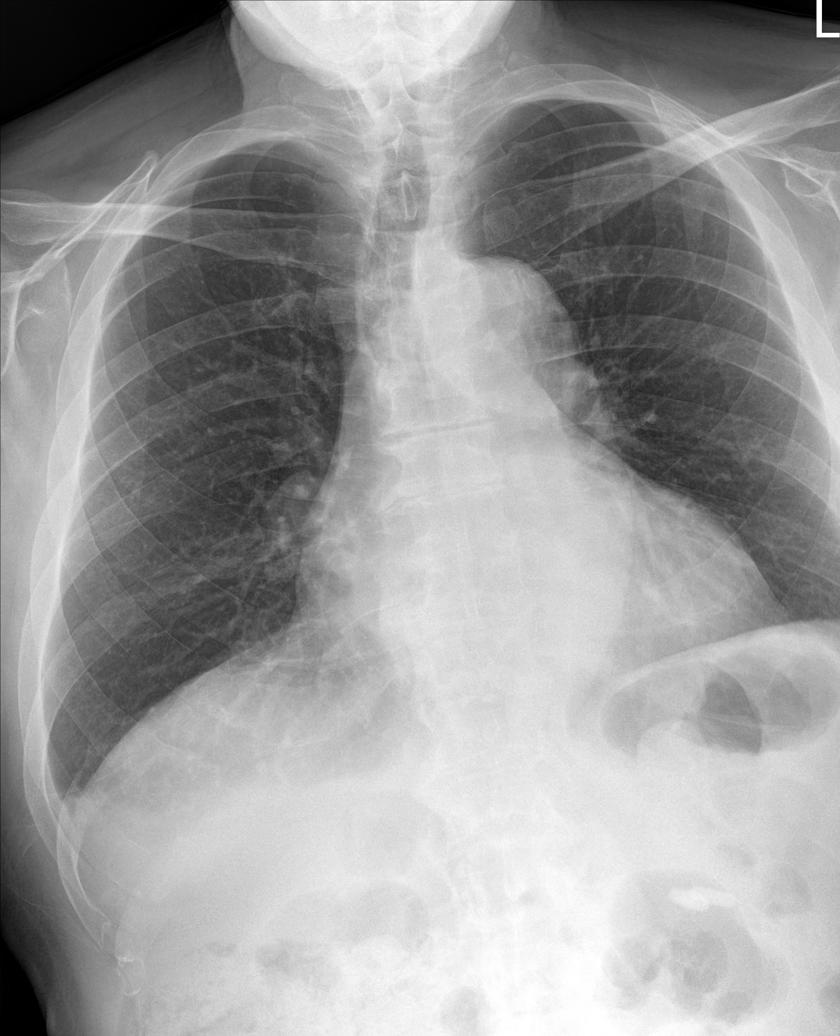

[chest ap]
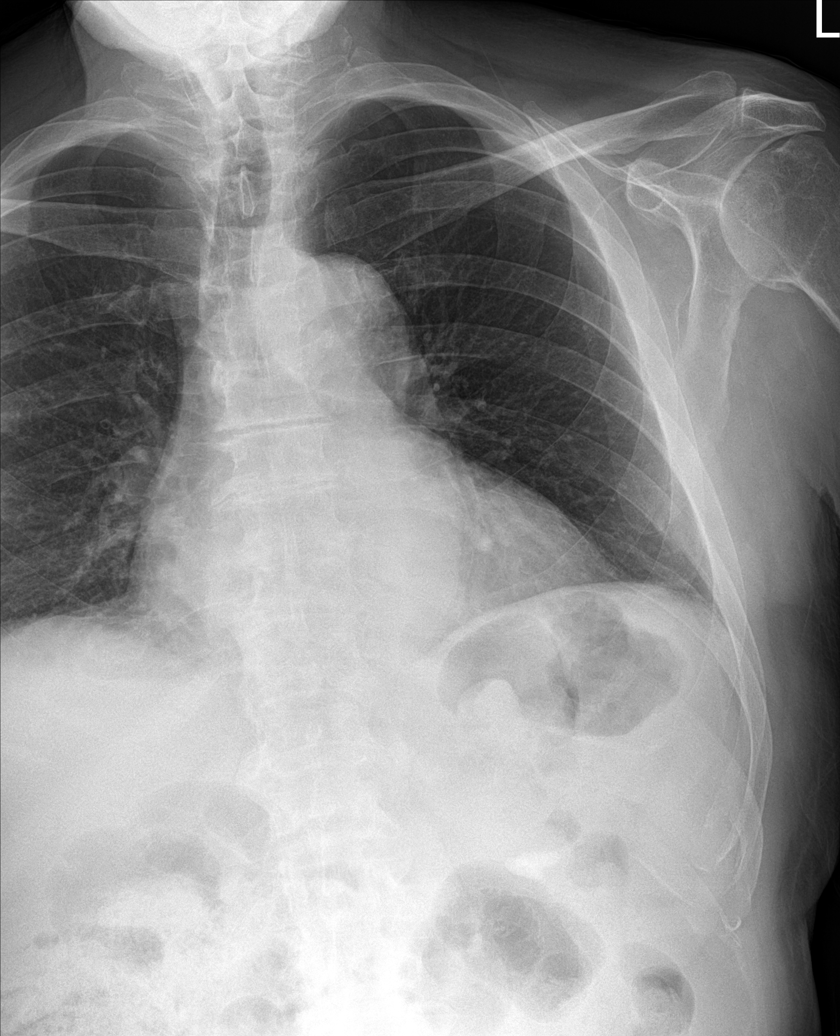

[5 of 5 positions shown; findings below may reference images not displayed]

FINDINGS: Lung volumes are low. No consolidative airspace disease. No pleural
effusions. No pneumothorax. No pulmonary nodule or mass noted.
Pulmonary vasculature and the cardiomediastinal silhouette are
within normal limits. Aortic atherosclerosis.

Dedicated views of the right ribs demonstrate no acute displaced
right-sided rib fractures.
IMPRESSION: 1. Low lung volumes without radiographic evidence of acute
cardiopulmonary disease.
2. No right-sided displaced rib fractures.

## 2022-03-09 DEATH — deceased
